# Patient Record
Sex: Female | Born: 1955 | Race: White | Hispanic: No | Marital: Married | Smoking: Never smoker
Health system: Southern US, Community
[De-identification: ages and names within clinical notes are randomized; demographics above are authoritative.]

## PROBLEM LIST (undated history)

## (undated) DIAGNOSIS — M1990 Unspecified osteoarthritis, unspecified site: Secondary | ICD-10-CM

## (undated) DIAGNOSIS — Z8719 Personal history of other diseases of the digestive system: Secondary | ICD-10-CM

## (undated) DIAGNOSIS — F329 Major depressive disorder, single episode, unspecified: Secondary | ICD-10-CM

## (undated) DIAGNOSIS — K5909 Other constipation: Secondary | ICD-10-CM

## (undated) DIAGNOSIS — R112 Nausea with vomiting, unspecified: Secondary | ICD-10-CM

## (undated) DIAGNOSIS — K219 Gastro-esophageal reflux disease without esophagitis: Secondary | ICD-10-CM

## (undated) DIAGNOSIS — Z9889 Other specified postprocedural states: Secondary | ICD-10-CM

## (undated) DIAGNOSIS — F32A Depression, unspecified: Secondary | ICD-10-CM

## (undated) DIAGNOSIS — R32 Unspecified urinary incontinence: Secondary | ICD-10-CM

## (undated) DIAGNOSIS — Z9289 Personal history of other medical treatment: Secondary | ICD-10-CM

## (undated) HISTORY — PX: ROTATOR CUFF REPAIR: SHX139

## (undated) HISTORY — DX: Unspecified urinary incontinence: R32

## (undated) HISTORY — PX: EYE SURGERY: SHX253

## (undated) HISTORY — PX: JOINT REPLACEMENT: SHX530

## (undated) HISTORY — PX: ABDOMINAL HYSTERECTOMY: SHX81

## (undated) HISTORY — PX: CATARACT EXTRACTION, BILATERAL: SHX1313

## (undated) HISTORY — DX: Other constipation: K59.09

## (undated) HISTORY — PX: HIATAL HERNIA REPAIR: SHX195

## (undated) HISTORY — PX: HERNIA REPAIR: SHX51

## (undated) HISTORY — DX: Gastro-esophageal reflux disease without esophagitis: K21.9

## (undated) HISTORY — PX: CHOLECYSTECTOMY: SHX55

---

## 1980-04-01 HISTORY — PX: APPENDECTOMY: SHX54

## 2000-05-28 ENCOUNTER — Inpatient Hospital Stay (HOSPITAL_COMMUNITY): Admission: EM | Admit: 2000-05-28 | Discharge: 2000-05-30 | Payer: Self-pay | Admitting: *Deleted

## 2000-12-29 ENCOUNTER — Ambulatory Visit (HOSPITAL_COMMUNITY): Admission: RE | Admit: 2000-12-29 | Discharge: 2000-12-29 | Payer: Self-pay | Admitting: Pulmonary Disease

## 2001-05-05 ENCOUNTER — Ambulatory Visit (HOSPITAL_COMMUNITY): Admission: RE | Admit: 2001-05-05 | Discharge: 2001-05-05 | Payer: Self-pay | Admitting: Orthopaedic Surgery

## 2001-09-28 ENCOUNTER — Ambulatory Visit (HOSPITAL_COMMUNITY): Admission: RE | Admit: 2001-09-28 | Discharge: 2001-09-28 | Payer: Self-pay | Admitting: Otolaryngology

## 2001-09-28 ENCOUNTER — Encounter: Payer: Self-pay | Admitting: Otolaryngology

## 2002-03-08 ENCOUNTER — Ambulatory Visit (HOSPITAL_COMMUNITY): Admission: RE | Admit: 2002-03-08 | Discharge: 2002-03-08 | Payer: Self-pay | Admitting: Pulmonary Disease

## 2002-12-01 ENCOUNTER — Ambulatory Visit (HOSPITAL_COMMUNITY): Admission: RE | Admit: 2002-12-01 | Discharge: 2002-12-01 | Payer: Self-pay | Admitting: Internal Medicine

## 2003-06-20 ENCOUNTER — Ambulatory Visit (HOSPITAL_COMMUNITY): Admission: RE | Admit: 2003-06-20 | Discharge: 2003-06-20 | Payer: Self-pay | Admitting: Internal Medicine

## 2003-12-19 ENCOUNTER — Inpatient Hospital Stay (HOSPITAL_COMMUNITY): Admission: RE | Admit: 2003-12-19 | Discharge: 2003-12-24 | Payer: Self-pay | Admitting: Obstetrics and Gynecology

## 2004-01-01 ENCOUNTER — Inpatient Hospital Stay (HOSPITAL_COMMUNITY): Admission: AD | Admit: 2004-01-01 | Discharge: 2004-01-04 | Payer: Self-pay | Admitting: Obstetrics & Gynecology

## 2005-04-18 ENCOUNTER — Ambulatory Visit (HOSPITAL_COMMUNITY): Admission: RE | Admit: 2005-04-18 | Discharge: 2005-04-18 | Payer: Self-pay | Admitting: Obstetrics and Gynecology

## 2005-08-21 ENCOUNTER — Ambulatory Visit (HOSPITAL_COMMUNITY): Admission: RE | Admit: 2005-08-21 | Discharge: 2005-08-21 | Payer: Self-pay | Admitting: Pulmonary Disease

## 2006-03-27 ENCOUNTER — Ambulatory Visit (HOSPITAL_COMMUNITY): Admission: RE | Admit: 2006-03-27 | Discharge: 2006-03-27 | Payer: Self-pay | Admitting: Obstetrics and Gynecology

## 2006-04-09 ENCOUNTER — Encounter: Admission: RE | Admit: 2006-04-09 | Discharge: 2006-04-09 | Payer: Self-pay | Admitting: Obstetrics and Gynecology

## 2006-04-29 ENCOUNTER — Ambulatory Visit (HOSPITAL_COMMUNITY): Admission: RE | Admit: 2006-04-29 | Discharge: 2006-04-29 | Payer: Self-pay | Admitting: Obstetrics & Gynecology

## 2006-11-04 ENCOUNTER — Ambulatory Visit (HOSPITAL_COMMUNITY): Admission: RE | Admit: 2006-11-04 | Discharge: 2006-11-04 | Payer: Self-pay | Admitting: Pulmonary Disease

## 2007-04-08 ENCOUNTER — Encounter: Admission: RE | Admit: 2007-04-08 | Discharge: 2007-04-08 | Payer: Self-pay | Admitting: Obstetrics and Gynecology

## 2007-06-15 ENCOUNTER — Encounter: Admission: RE | Admit: 2007-06-15 | Discharge: 2007-06-15 | Payer: Self-pay | Admitting: Otolaryngology

## 2007-08-04 ENCOUNTER — Ambulatory Visit (HOSPITAL_COMMUNITY): Admission: RE | Admit: 2007-08-04 | Discharge: 2007-08-04 | Payer: Self-pay | Admitting: Pulmonary Disease

## 2007-09-03 ENCOUNTER — Emergency Department (HOSPITAL_COMMUNITY): Admission: EM | Admit: 2007-09-03 | Discharge: 2007-09-03 | Payer: Self-pay | Admitting: Emergency Medicine

## 2008-12-21 ENCOUNTER — Encounter: Admission: RE | Admit: 2008-12-21 | Discharge: 2008-12-21 | Payer: Self-pay | Admitting: Neurosurgery

## 2009-01-19 ENCOUNTER — Encounter: Admission: RE | Admit: 2009-01-19 | Discharge: 2009-01-19 | Payer: Self-pay | Admitting: Neurosurgery

## 2009-01-19 ENCOUNTER — Encounter: Admission: RE | Admit: 2009-01-19 | Discharge: 2009-01-19 | Payer: Self-pay | Admitting: Pulmonary Disease

## 2009-02-15 ENCOUNTER — Ambulatory Visit (HOSPITAL_COMMUNITY): Admission: RE | Admit: 2009-02-15 | Discharge: 2009-02-15 | Payer: Self-pay | Admitting: Orthopaedic Surgery

## 2009-11-23 ENCOUNTER — Ambulatory Visit (HOSPITAL_BASED_OUTPATIENT_CLINIC_OR_DEPARTMENT_OTHER): Admission: RE | Admit: 2009-11-23 | Discharge: 2009-11-23 | Payer: Self-pay | Admitting: Orthopaedic Surgery

## 2009-11-29 ENCOUNTER — Ambulatory Visit (HOSPITAL_COMMUNITY): Admission: RE | Admit: 2009-11-29 | Discharge: 2009-11-29 | Payer: Self-pay | Admitting: Orthopaedic Surgery

## 2009-12-05 ENCOUNTER — Encounter (HOSPITAL_COMMUNITY): Admission: RE | Admit: 2009-12-05 | Discharge: 2010-01-04 | Payer: Self-pay | Admitting: Family Medicine

## 2010-01-22 ENCOUNTER — Encounter: Admission: RE | Admit: 2010-01-22 | Discharge: 2010-01-22 | Payer: Self-pay | Admitting: Obstetrics and Gynecology

## 2010-04-22 ENCOUNTER — Encounter: Payer: Self-pay | Admitting: Pulmonary Disease

## 2010-06-15 LAB — POCT HEMOGLOBIN-HEMACUE: Hemoglobin: 14 g/dL (ref 12.0–15.0)

## 2010-08-17 NOTE — H&P (Signed)
[facility]  Patient:    Wendy Reeves, Wendy Reeves                       MRN: [id number] Adm. Date:  [id number] Disc. Date: [id number] Attending:  Denny Peon Dictator:   Landry Corporal, N.P.                         History and Physical  PLEASE ADD INTO ABDOMEN ON PHYSICAL EXAMINATION:  Patient has well healed scars from prior surgery. DD:  05/29/00 TD:  05/30/00 Job: [id number]

## 2010-08-17 NOTE — H&P (Signed)
[facility]  Patient:    Wendy Reeves, Wendy Reeves                       MRN: [id number] Adm. Date:  [id number] Disc. Date: [id number] Attending:  Denny Peon Dictator:   Landry Corporal, N.P.                         History and Physical  PATIENT IDENTIFICATION:  This is a 55 year old  married white female voluntarily admitted to [facility] on May 29, 2000 for depression and suicidal ideation.  HISTORY OF PRESENT ILLNESS:  Patient presented with a history of depression that had exacerbated when patient began to switch her schedules at work. Patient does have a long history of depression.  She has recently been changing her medications and has been tried on a new antidepressant about a week ago.  Presently on Celexa 60 mg for approximately 1 week.  Patient has been experiencing headaches, she has been crying, feeling like she "cant function," feeling no energy.  She has been sleeping poorly after her night shift.  Her appetite has been good.  She denies any current suicidal ideation, homicidal ideation, no auditory or visual hallucinations, no paranoia.  PAST PSYCHIATRIC HISTORY:  She has had no inpatient admissions.  She sees Dr. Wynonia Lawman in [place] and Dr. Corliss Marcus.  She has been seeing them since last May.  She has been on Paxil for approximately 10 years, and then was put on Wellbutrin, felt that it was not as effective, and then on Effexor where she was experiencing headaches, and is now on Celexa, initially started at 18, and is presently on 60 mg for 1 week.  SUBSTANCE ABUSE HISTORY:  She is a nonsmoker, rare alcohol drink, and denies any substance abuse.  PAST MEDICAL HISTORY:  Her primary care physician is Dr. Juanetta Gosling in [place].  Medical problems are none.  Patient did have a history of meningitis 10 years ago that she feels prompted the beginning of her depression, with a fever of about 103.  Medications:  She is on Vioxx 25 mg  1 p.o. q.d., Lamictal 25 mg p.o. q.a.m. and 1 at 1400 and 2 at bedtime.  She takes Ambien 5-10 mg h.s. for sleep which is effective, and Senestine 1.25 mg at bedtime, and she takes Lipitor 10 mg p.o. q.h.s.  DRUG ALLERGIES:  Patient is allergic to PENICILLIN, SULFA and BETADINE.  She gets rashes and breaks out in hives.  PHYSICAL EXAMINATION:  Performed today on the unit with no significant findings.  See below.  Labs are pending.  We are awaiting a thyroid level.  REVIEW OF SYSTEMS:  Patient has had no fever or chills.  She has had a history of night sweats, placed on estrogen replacement.  Patient reports no blurred or double vision.  She wears glasses for reading.  She had an eye exam in January.  Patient has had an occasional ear ache, with an recent ear infection in October.  No hearing loss or sinus pain.  No chest pain, palpations or racing of her heart.  She is a nonsmoker.  No history of hypertension. Patient has no cough or shortness of breath, or orthopnea, no history of COPD. GI:  No change in habits.  Patient does report some occasional constipation, no blood in the stool.  GU:  No frequency, dysuria, or hematuria. MUSCULOSKELETAL:  Patient does have occasional neck pain which  she takes Vioxx for.  SKIN:  Occasional dryness, no itching or rash or bruising.  NEUROLOGIC: No seizures or memory loss.  Patient has been experiencing recent headaches. Patient has a history of depression.  ENDOCRINE:  No signs or symptoms of diabetes or thyroid problems.  LYMPH:  No enlarged or tender nodes. ALLERGIES:  No environmental allergies reported.  PHYSICAL EXAMINATION:  Vital signs 98.3, 68 heart rate, respiratory rate of 20, blood pressure 111/58.  GENERAL APPEARANCE:  Patient is a 55 year old white female sitting on the exam table in no acute distress.  She is well developed, appears her stated age. She is well-groomed, alert and cooperative.  HEAD is normocephalic.  She can raise  her eyebrows.  EYES:  Pupils are equal and reactive to light.  Her EOMs are intact bilaterally.  Funduscopic exam was within normal limits.  EARS:  External ear canals are patent.  Her TMs are intact.  Nostrils are patent bilaterally.  There is no sinus tenderness.  Her mouth mucosa is moist, she has good dentition.  There are no lesions seen. Her tongue protrudes midline without tremor.  She can clench her teeth and puff out her cheeks.  No pharyngeal exudate.  NECK is supple, full range of motion, no JVD, negative lymphadenopathy. Trachea is midline.  Thyroid is nonpalpable and nontender.  Her chest is clear to auscultation, no adventitious sounds.  HEART rate is regular rate and rhythm, no murmurs or gallops.  Carotid pulses are equal and adequate bilaterally.  There are no carotid bruits auscultated. There is no edema noted.  ABDOMEN:  She has a soft, nontender abdomen.  There are no masses or organomegaly palpated.  Active bowel sounds are present.  No CVA tenderness.  MUSCULAR:  There is no joint swelling or deformity.  Her muscle strength and tone is equal bilaterally.  SKIN is warm and dry.  She does have small, superficial abrasions to her left arm which she states is from being out in the yard.  Her nail beds are pink with good capillary refill.  She has 2+ radial pulses.  NEUROLOGICALLY she is oriented x 3.  Her cranial nerves are grossly intact. Her deep tendon reflexes are 2+, equal and adequate.  She has good grip strength bilaterally, no involuntary movements.  Her gait is normal. Cerebellar function is intact, with heel to shin, finger to finger.  Romberg is negative.  SOCIAL HISTORY:  She is a 55 year old married white female, for 23 years, has 3 children, twins ages 4 and a 47 year old .  She also has a Surveyor, quantity. She lives with her spouse and children.  She lives in [employer] [place] at [place] [employer] [place] for the past 14 years.  She has completed high  school. 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  FAMILY HISTORY:  She has a sister with a prior suicide attempt years ago.   MENTAL STATUS EXAMINATION:  She is an alert middle-aged white female.  She is cooperative and casually dressed.  Her speech is normal and relevant.  Her mood is euthymic.  Her affect is pleasant.  Her thought processes are coherent.  There is no evidence of psychosis, no auditory or visual hallucinations, no suicidal or homicidal ideation, no paranoia.  Cognitive function is intact.  Memory is good.  Judgment is good.  Insight is good.  ADMISSION DIAGNOSES: Axis I:    Major depression. Axis II:   Deferred. Axis III:  Joint pain, headaches, hypercholesterolemia. Axis  IV:   Mild, problems related to occupation. Axis V:    Current is 35, past year 65.  INITIAL PLAN OF CARE:  Voluntary admission to [facility] for depression and medication adjustment, contract for safety, check every 15 minutes.  Patient agrees to be safe.  We will resume her routine medications and add Wellbutrin, a small dose, for patient tolerance and to combat depressive symptoms.  Patient is to attend groups.  Dr. Lourdes Sledge in for interview and available for consultation.  ESTIMATED LENGTH OF STAY:  Two to three days.  POST HOSPITAL CARE PLAN:  To return to prior living arrangements, and with the possibility of making adjustments in her work schedule. DD:  05/29/00 TD:  05/30/00 Job: [id number]

## 2010-08-17 NOTE — Op Note (Signed)
[facility]  Patient:    Wendy Reeves, PILLARS Visit Number: [id number] MRN: [id number]          Service Type: DSU Location: DAY Attending Physician:  Windle Guard Dictated by:   Darreld Mclean, M.D. Proc. Date: 05/05/01 Admit Date:  05/05/2001                             Operative Report  PREOPERATIVE DIAGNOSIS:  Recurrent perionychia right ring finger.  POSTOPERATIVE DIAGNOSIS:  Recurrent perionychia, right ring finger.  OPERATION:  Excision, drainage, irrigation, debridement of right ring finger perionychium with placement of wick.  SURGEON:  Darreld Mclean, M.D.  ASSISTANT:  B. Manson Passey, P.A.C.  ANESTHESIA:  Bier block  INDICATIONS:  The patient had perionychia of the right ring finger last summer, treated.  She did well.  This recurred.  I made a small incision last week.  The perionychia is slow in resolving.  Surgery is recommended to fully open it and fully debride it and remove tissue that is infected.  She has been on Cipro for the last week.  The finger is getting somewhat better, but she still has some drainage.  The most pertinent thing to do is to perform a debridement.  FINDINGS:  The patient had some slight yellow pus.  There was some tissue more on the most ulnar aspect of the finger that was somewhat purulent.  This was removed and debrided.  DESCRIPTION OF PROCEDURE:  The patient was placed supine on the operating table.  Bier block anesthesia was given to right upper extremity   The patient was prepped and draped in the usual manner.  Incision was made on each side of the nail bed, and then the skin was brought back, and first cultures were done.  Then the area was debrided sharply with a knife and then a rongeur and then a curet to remove the tissue that was obviously infected and nonviable. Water Pik lavage was used, 3 liters saline used through [employer], debriding this.  Care was taken not to damage the nail matrix or the  origin of the nail.  Care was also taken not to damage the insertion of the extensor tendon.  The wick was placed.  A sterile bulky dressing was applied, then a bulky 2 x 2 gauze dressing was applied.  The patient tolerated the procedure well and went to recovery in good condition.  She was given Tylox for pain and Levaquin for an antibiotic.  I will see her tomorrow in [place] and will remove the dressing and take out the wick. Dictated by:   Darreld Mclean, M.D. Attending Physician:  Windle Guard DD:  05/05/01 TD:  05/06/01 Job: [id number]

## 2010-08-17 NOTE — Discharge Summary (Signed)
NAMECHANTAVIA, BAZZLE NO.:  [id number]   MEDICAL RECORD NO.:  [id number]          PATIENT TYPE:  INP   LOCATION:  A427                          FACILITY:  [facility]   PHYSICIAN:  Tilda Burrow, M.D. 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   DISCHARGE DIAGNOSIS:  Dyspaurnia, anterior abdominal wall bowel adhesions,  involving the sigmoid colon, both ovaries, periumbilical adhesions, small  bowel.   PROCEDURE:  Diagnostic laparoscopy, small bowel enterotomy, conversion to  laparotomy, repair of small bowel enterotomy by Dr. Lovell Sheehan, bilateral  salpingo-oophorectomy and lysis of adhesion.   SURGEON:  Tilda Burrow, M.D.   DISCHARGE MEDICATIONS:  1.  Vicodin one to two tablets q.4h. p.r.n. pain.  2.  Phenergan one tablet q.6h. p.r.n. nausea.  3.  Milk of Magnesia two tablespoons daily until bowel movement.   HOSPITAL SUMMARY:  This 55 year old postmenopausal female not on hormone  therapy, is admitted for laparoscopic removal of tubes and ovaries.  She is  status post 10 years status post abdominal hysterectomy with postoperative  complications at readmission for 24 hours, two weeks later due to pelvic  discomfort.  She has had gradual progression and constipation over the  years.  She is suspected to have residual pelvic adhesions status post her  prior surgery.   PAST MEDICAL HISTORY:  Notable for chronic constipation in 2004 and 2005  with a negative workup.  She has a history of gastroesophageal reflux  disease, depression and intermittent headaches.  See admitting history for  further details.   HOSPITAL COURSE:  The patient was admitted, underwent diagnostic laparoscopy  which was performed on the day of admission after day surgery admission with  bowel having been prepped the day  before.  An infraumbilical vertical 1 cm  skin incision was made, and a Veress needle was used successfully.  It was  apparent that there was bowel loop adherent to where we entered in the  subumbilical site, and we had perforation of a loop of small bowel.  This  was recognized immediately.  We proceeded with laparotomy with consultation  by Dr. Lovell Sheehan.  We entered the abdominal cavity, mobilized the bowel and  performed a small bowel repair.  Subsequent findings included lower pelvis  with some thin, small adhesions of omentum with the pelvis itself being  particularly adherent in the left adnexa with small bowel loops removed,  mobilized, separated, and bilateral salpingo-oophorectomy performed.  The  rectosigmoid required resection from the left pelvic side wall due to  extensive longstanding adhesions.  Right salpingo-oophorectomy was less  challenging.  The salpingo-oophorectomy had required extensive sigmoid  adhesions removal.  The vaginal cuff was attached to the distal sigmoid in  such a way that it was felt it was likely this contributed to the chronic  constipation.  It is noted that the sigmoid was quite thin in the pelvis and  moderately dilated.   Postoperatively, the patient  did well, and had Jackson-Pratt drains in  place.   The patient had a nasogastric tube for approximately 48 hours.  She had PCA  pain control.  The bowels were hypoactive and virtually absent for the first  two days after surgery.   On postoperative day #3, the abdomen was soft, nontender.  The nasogastric  tube was discontinued.  She was begun on full liquids.  She remained  afebrile with hypoactive bowel sounds.  She complained of minimal incisional  pain.  A subcutaneous Jackson-Pratt drain that had been left in at the time  of surgery was removed on September 23, postoperative day #4.  She tolerated  that, and was able to go home on December 24, 2003.   DISCHARGE MEDICATIONS:  1.  Reglan.   2.  Vicodin.  3.  Milk of Magnesia.  4.  Along with her previous medications taken at home which consisted of      Celexa 40 mg b.i.d., Lamictal 200 mg q.a.m. and 400 mg q.h.s., Neurontin      600 mg b.i.d., trazodone 100 mg q.h.s.   FOLLOWUP:  With me in one week for staple removal.     John   JVF/MEDQ  D:  03/07/2004  T:  03/08/2004  Job:  [id number]

## 2010-08-17 NOTE — H&P (Signed)
NAMEARIELL, Reeves NO.:  [id number]   MEDICAL RECORD NO.:  [id number]          PATIENT TYPE:  INP   LOCATION:  A425                          FACILITY:  [facility]   PHYSICIAN:  Lazaro Arms, M.D.   DATE OF BIRTH:  [birth year]   DATE OF ADMISSION:  01/01/2004  DATE OF DISCHARGE:  LH                                HISTORY & PHYSICAL   HISTORY OF PRESENT ILLNESS:  Wendy Reeves is a 55 year old white female, who is  now 2 weeks postop from a laparoscopic diagnostic laparoscopy with a  subsequent laparotomy with BSO and a repair of a small bowel enterotomy.  Postoperatively, the patient has had slow return of normal bowel function.  She had a slightly prolonged intrahospital course, was discharged with some  degree of diminished bowel function, but the patient was progressing  normally with flatus, bowel movement, and tolerating her diet.  She has been  into the office on several occasions postoperatively complaining of  bloating, nausea, vomiting, and abdominal pain.  She was last seen in the  office on December 30, 2003, at which time it was felt she continued to  have symptoms of a postoperative ileus.  Dr. Emelda Fear and I evaluated her at  that time and told her that if she had any problems over the weekend to  contact us.  She, about 10:30 last night, began having nausea and vomiting  and increase in her abdominal pain.  She called this morning, and I told her  to come in for bowel rest, IV hydration, and clinical evaluation.   At the time of admission, the patient has I would say mild to moderate  distention, stable for what it was 2 days prior to admission.  Her incision  was clean, dry, and intact.  Staples removed; Steri-Strips were removed.  The bowel was soft, not overly tender.  There are positive bowel sounds in  all 4 quadrants.  No significant rushes or tinkles.  As a result, she is  admitted for IV hydration, x-ray evaluation of her bowel dilation, and  clinical observation.   PAST MEDICAL HISTORY:  1.  The patient has a history of chronic constipation during 2004-2005, work-      up by Dr. __________was negative.  2.  History of reflux.  3.  Depression.  4.  Intermittent headaches.   PAST SURGICAL HISTORY:  1.  She had two C-sections and tubal ligation in 1986.  2.  Appendectomy in 1982.  3.  Two D&C's in the past.  4.  She has had obviously the recent laparoscopy and laparotomy for BSO and      the enterotomy that was repaired by Dr. Lovell Sheehan.   MEDICATIONS:  1.  Celexa 40 mg b.i.d.  2.  Lamictal 200 mg in the morning, 400 mg at night.  3.  Neurontin 600 mg b.i.d.  4.  Trazodone 100 mg at night.   PHYSICAL EXAMINATION:  HEENT:  Unremarkable.  LUNGS:  Clear.  HEART:  Regular rate and rhythm, no murmurs, rubs, or gallops.  BREASTS:  Deferred.  ABDOMEN:  Soft, mild to moderate distention, stable from 48 hours previous.  Auscultation reveals positive bowel sounds in all 4 quadrants.  No rushes or  tinkles are heard.  It is again, basically stable with 48 hours ago.  PELVIC:  Deferred.  EXTREMITIES:  Warm with no edema.  NEUROLOGIC:  Grossly intact.   IMPRESSION:  1.  Postoperative ileus.  2.  Evaluate for early small bowel obstruction.   PLAN:  The patient is admitted to undergo IV hydration, bowel rest, nausea  control, pain control, evaluation with plain films, and I will contact Dr.  Lovell Sheehan and have him evaluate the patient as well.  Laboratory data are also  drawn.      LHE/MEDQ  D:  01/01/2004  T:  01/01/2004  Job:  [id number]

## 2010-08-17 NOTE — Op Note (Signed)
NAME:  Wendy Reeves, Wendy Reeves                          ACCOUNT NO.:  [id number]   MEDICAL RECORD NO.:  [id number]                   PATIENT TYPE:  AMB   LOCATION:  DAY                                  FACILITY:  [facility]   PHYSICIAN:  Dalia Heading, M.D.               DATE OF BIRTH:  1956/01/09   DATE OF PROCEDURE:  12/19/2003  DATE OF DISCHARGE:                                 OPERATIVE REPORT   PREOPERATIVE DIAGNOSIS:  Iatrogenic small bowel enterotomy.   POSTOPERATIVE DIAGNOSIS:  Iatrogenic small bowel enterotomy.   PROCEDURE:  Repair of small bowel enterotomy.   SURGEON:  Dalia Heading, M.D.   ASSISTANT:  Tilda Burrow, M.D.   ANESTHESIA:  General endotracheal.   INDICATIONS FOR PROCEDURE:  The patient is a 55 year old white female who  was already under general endotracheal anesthesia when Dr. Emelda Fear, during  an attempt to enter the abdominal cavity for a laparoscopic procedure, noted  a small bowel enterotomy due to the trocar insertion which was done under  camera visualization.  He requested that I come in to inspect and repair the  small intestine.   DESCRIPTION OF PROCEDURE:  A midline incision was made from the umbilicus to  the suprapubic region.  In the upper portion of the incision, several loops  of small bowel were noted to be adhesed to the underside of the abdominal  wall.  These were freed away sharply without difficulty.  A small bowel  enterotomy was noted on the antimesenteric border of the mid-small bowel.  No through-and-through injury was noted.  The mesentery was inspected and  noted to be within normal limits.  The edges were noted to be clean, without  necrosis or bleeding.  The enterotomy measured only about 1 cm in size.  This was closed transversely using 3-0 silk sutures in a two-layer fashion.  The repair was noted to be intact.  The rest of the adhesions were also  taken down into the pelvis.  The small bowel was then run from the ligament  of  Treitz to the terminal ileum.  No other injuries were noted.  The  enterotomy repair was also inspected again and noted to be within normal  limits.  Minimal spillage was noted.  The abdomen and pelvis was then  copiously irrigated with gentamycin and normal saline.  Dr. Emelda Fear was  then going to proceed with his intended surgery.   ESTIMATED BLOOD LOSS:  Minimal.   SPECIMENS:  None.      MAJ/MEDQ  D:  12/19/2003  T:  12/19/2003  Job:  [id number]   cc:   Tilda Burrow, M.D.  [address] [place]  [place] [address]  Fax: [phone]

## 2010-08-17 NOTE — Op Note (Signed)
NAME:  Wendy Reeves, Wendy Reeves                          ACCOUNT NO.:  [id number]   MEDICAL RECORD NO.:  [id number]                   PATIENT TYPE:  AMB   LOCATION:  DAY                                  FACILITY:  [facility]   PHYSICIAN:  Lionel December, M.D.                 DATE OF BIRTH:  Jan 02, 1956   DATE OF PROCEDURE:  12/01/2002  DATE OF DISCHARGE:                                 OPERATIVE REPORT   PROCEDURE:  Total colonoscopy.   INDICATIONS FOR PROCEDURE:  Jazzlynn is a 55 year old Caucasian female with an  eight-month history of constipation.  She is undergoing colonoscopy  primarily for diagnostic purposes.  Recent TSH was normal, and serum calcium  has been drawn and is pending.  She was begun on MiraLax and Fiber Choice  and is doing a lot better.  The procedure was reviewed with the patient, and  informed consent was obtained.   PREOPERATIVE MEDICATIONS:  Demerol 40 mg IV, Versed 5 mg IV in divided  doses.   FINDINGS:  The procedure was performed in the endoscopy suite.  The  patient's vital signs and O2 saturations were monitored during the procedure  and remained stable.  The patient was placed in the left lateral recumbent  position and rectal examination performed.  No abnormality noted on external  or digital exam.  The Olympus videoscope was placed into the rectum and  advanced into the region of the sigmoid colon and beyond.  She had fine  mucosal pigmentation, more pronounced at the sigmoid colon, consistent with  melanosis coli.  The scope was passed to the cecum which was identified by  the ileocecal valve and appendiceal stump.  Pictures were taken for the  record.  The preparation overall was felt to be satisfactory.  As the scope  was withdrawn, the colonic mucosa was once again carefully examined and was  normal throughout, other than the mucosal pigmentation.  The rectal mucosa  was normal.  The scope was retroflexed to examine the anorectal junction  which was  unremarkable.  The endoscope was straightened and withdrawn.  The  patient tolerated the procedure well.   FINAL DIAGNOSIS:  Mild changes of melanosis coli.  Otherwise, normal  colonoscopy.    RECOMMENDATIONS:  1. She will continue Fiber Choice and MiraLax as before.  2. She will return for office visit in three months.  3. I will contact the patient with the results of the serum calcium.                                               Lionel December, M.D.    NR/MEDQ  D:  12/01/2002  T:  12/01/2002  Job:  [id number]   cc:   Tilda Burrow, M.D.  [address]  [address] [place]  [place]  [place] [address]  Fax: [phone]

## 2010-08-17 NOTE — H&P (Signed)
NAME:  Wendy Reeves, Wendy Reeves                          ACCOUNT NO.:  [id number]   MEDICAL RECORD NO.:  [id number]                   PATIENT TYPE:  AMB   LOCATION:  DAY                                  FACILITY:  [facility]   PHYSICIAN:  Tilda Burrow, M.D.              DATE OF BIRTH:  [birth year]   DATE OF ADMISSION:  DATE OF DISCHARGE:                                HISTORY & PHYSICAL   ADDENDUM:  No dictation given.                                            Tilda Burrow, M.D.    JVF/MEDQ  D:  12/18/2003  T:  12/18/2003  Job:  [id number]   cc:   [facility]

## 2010-08-17 NOTE — H&P (Signed)
NAMEREISHA, WOS NO.:  [id number]   MEDICAL RECORD NO.:  [id number]          PATIENT TYPE:  AMB   LOCATION:  DAY                           FACILITY:  [facility]   PHYSICIAN:  Tilda Burrow, M.D. DATE OF BIRTH:  January 16, 1956   DATE OF ADMISSION:  DATE OF DISCHARGE:  LH                                HISTORY & PHYSICAL   ADMISSION DIAGNOSIS:  Dyspareunia, scheduled for laparoscopic removal of  ovaries.   HISTORY OF PRESENT ILLNESS:  This 55 year old female, postmenopausal, not  currently on hormone therapy, is admitted at this time for laparoscopic  removal of tubes and ovaries.  Robena is 10 years status post abdominal  hysterectomy with postoperative course complicated by readmission for 24  hours two weeks after surgery due to pelvic discomfort.  She was never  febrile, and had a relatively straightforward course.  It was felt related  to probably related ovarian function.  She subsequently has done relatively  well, but has tolerated progressively increasing dyspareunia with the  passage of time.  At this point the pain is considered incapacitating.  She  has good pelvic support but has tenderness at the vaginal cuff with a sense  of fullness just above the vaginal cuff on the left side.  She is suspected  to have residual adhesions, status post her prior surgery and likely ovarian  involvement.  Plans are for laparoscopic assessment of the pelvis with plans  to proceed with laparoscopic removal of both tubes and ovaries.  One  potential for a need for laparotomy, dependent on findings, is being quoted  to the patient at 5-10% chance.  Questions regarding surgery have been  answered to the patient's satisfaction.   PAST MEDICAL HISTORY:  Positive for chronic constipation during 2004 and  2005 with workup by Dr. Karilyn Cota, negative and patient eventually  discontinuing all of her anticonstipation medications.  She also has a  history of reflux, depression, and  intermittent headaches.   PAST SURGICAL HISTORY:  1.  Positive for C-sections x2.  2.  Tubal ligation, 1986.  3.  Appendectomy, 1982.  4.  D&C x2 in the past.  5.  Subsequently she has had laparoscopic cholecystectomy, with colonoscopy      performed in 2004, reportedly normal.   MEDICATIONS:  1.  Celexa 40 mg b.i.d.  2.  Lamictal 200 mg q.a.m., 400 mg q.h.s.  3.  Neurontin 600 mg b.i.d.  4.  Trazodone 100 mg p.o. q.h.s. for sleep, taken p.r.n.  5.  The patient has discontinued her __________ greater than six months ago.   PHYSICAL EXAMINATION:  GENERAL:  She is a healthy, cheerful, appropriate,  oriented Caucasian female.  HEENT:  Pupils equal, round, and reactive.  NECK:  Supple.  CHEST:  Clear to auscultation.  ABDOMEN:  Well-healed lower abdominal scar.  GENITALIA:  External genitalia normal.  Good vaginal support.  Vaginal cuff:  There are no masses above the vagina but a slight sense of fullness at the  left adnexa felt to represent adhesions, most likely involving the ovary  given her pain complaints.  EXTREMITIES:  Grossly normal without cyanosis, clubbing, or edema.  LYMPH NODES:  There is no adenopathy.   ASSESSMENT:  Dyspareunia due to vaginal cuff contact, suspected adhesions by  the vaginal cuff.   PLAN:  Laparoscopic bilateral salpingo-oophorectomy with probable lysis of  adhesions on December 19, 2003.  Plans are that if laparotomy is required,  the patient requests additional revision of her old C-section scar, with  excision of redundant skin as clinical circumstance allows.     John   JVF/MEDQ  D:  12/18/2003  T:  12/18/2003  Job:  [id number]   cc:   Tilda Burrow, M.D.  [address] [place]  [place] [address]  Fax: [phone]   Oneal Deputy. Juanetta Gosling, M.D.  [address]  [place]  [place] [address]  Fax: [phone]

## 2010-08-17 NOTE — Op Note (Signed)
NAMELEVETA, WAHAB NO.:  [id number]   MEDICAL RECORD NO.:  [id number]          PATIENT TYPE:  INP   LOCATION:  A427                          FACILITY:  [facility]   PHYSICIAN:  Tilda Burrow, M.D. DATE OF BIRTH:  [birth year]   DATE OF PROCEDURE:  12/19/2003  DATE OF DISCHARGE:                                 OPERATIVE REPORT   PREOPERATIVE DIAGNOSIS:  Dyspareunia, scheduled for laparoscopic removal of  ovaries.   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   PROCEDURE:  Diagnostic laparoscopy, small bowel enterotomy, conversion to  laparotomy with repair of small bowel enterotomy by Dr. Lovell Sheehan, and  subsequent bilateral salpingo-oophorectomy and lysis of adhesions by Dr. Jannifer Franklin   SURGEON:  Jorene Minors.   ANESTHESIA:  General.   COMPLICATIONS:  Necessity for midline incision to fully access abdominal  cavity to perform bowel repair.   FINDINGS:  Two loops of bowel just below the umbilicus, one of which was  injured during laparoscopic entry, recognized immediately. Extensive pelvic  adhesions with large dilated and tortuous sigmoid colon.   DETAILS OF PROCEDURE:  The patient was taken to the operating room, prepped  and draped in the usual fashion for combined abdominal and vaginal procedure  with legs supported in the yellow-fin low lithotomy leg supports and Foley  catheter in place. The abdomen was prepped and draped, and an infraumbilical  vertical 1-cm skin incision performed through the skin. The patient was  noted to have several upper abdomen puncture sites from prior laparoscopies,  and we were careful to avoid going through an old scar. Veress needle was  used to introduce through the umbilicus, being careful to elevate the  abdominal activity and point the Veress needle towards the pelvis. Water  droplet technique was used to  confirm intraperitoneal location, and  pneumoperitoneum achieved under 8 mmHg 2 to 3 liters CO2 instilled.  Laparoscopic 10-mm camera directed. Trocar was then used to enter the  abdominal cavity. The laparoscope was oriented toward the pelvis and passed  in the peritoneal fat for a distance of approximately 5 cm and was not able  to enter the abdominal cavity on the very first attempt but was reoriented  toward the pelvis and used with laparoscopic guidance and entered what  appeared to be abdomen until the trocar sleeve was emptied and the camera  reinserted. It was apparent that we had perforated into the loop of small  bowel. This was promptly recognized on the very start of the case. We  therefore left the trocar sleeve in place with the camera and prepped for  conversion for laparotomy. Decision was made to proceed with a midline  vertical incision. We made a vertical incision, removing a small ellipse of  redundant skin. We entered through the abdomen. By this time, Dr. Lovell Sheehan  was there having been requested for consultation and assistance. We then  entered the abdominal cavity carefully, removed the laparoscopic trocar,  identified the small bowel injury, and were able to  mobilize the small bowel  from its attachments which were quite dense, to the subumbilical area. There  were only 2 loops of bowel parallel to one another in the area below the  umbilicus. There was relatively easy mobilization of the bowel. The lower  pelvis had only some small thin adhesions of omentum. The pelvis itself was  quite adherent, particularly the left adnexa where the sigmoid was densely  adherent against the left pelvic side wall. We proceeded with mobilization  of the small bowel loops, separating them all and then inspecting carefully  to ensure that no through and through injury had occurred. None was  identified. This was consistent with the initial laparoscopic findings. We  were able to close  the bowel in 2 layer closure using 2-0 silk sutures  interrupted fashion placed in 2 layer closure. The pelvis was copiously  irrigated with antibiotic solution including gentamicin containing solution.  The small bowel was mobilized with some thin filmy adhesions encountered at  various spots. The entire bowel could be run. It was noted that there was a  significant amount of omental adhesions into the left lower quadrant. After  Dr. Lovell Sheehan had completed his peritoneal closure, we irrigated the abdomen  carefully, inspected the pelvis, and confirmed that there was relatively  clean pelvis other than the obscured left adnexa. Dr. Lovell Sheehan proceeded with  his own previously planned surgery thereafter.   The procedure was assisted by Kerry Hough, CST-FA. We proceeded to march  down along identifiable cleavage planes, carefully immobilizing the  rectosigmoid from the left pelvic side wall. There were extensive old long  standing adhesions holding the bowel from the side wall. These were easily  taken down once we confined satisfactory cleavage planes. We marched down  the side wall, being careful not to go retroperitoneal and to not injure or  denude the bowel. We eventually identified both adnexal structures, having  pulled away filmy adhesions from both. Once this was done, we were able to  perform bilateral salpingo-oophorectomy.   Right salpingo-oophorectomy was performed by grasping a readily identifiable  quiescent right ovary with Babcock clamp, mobilizing any small adhesions  from the right adnexa and gradually  freeing up the adnexal structures.  These could be dissected off the side wall with careful identification of  the right ureter, and we were able to take off the right tube and ovary with  identifying and isolating the infundibulopelvic ligament well away from the  ureter, cross clamping, and then peeling the ovary from its imbedded  position in the right pelvic side  wall.  Then we proceeded with the left salpingo-oophorectomy which first required  extensive dissection from the sigmoid to mobilize it off of the left pelvic  side wall. Distal sigmoid was also attached to the top of the vaginal cuff,  and it was felt that the bowel adhesions were a significant complicating  factor of the patient's long standing chronic constipation. We were able to  gradually mobilize the rectosigmoid and identify thin quiescent ovary stuck  to the side of the pelvis and the sigmoid. The colon was taken down from its  adhesions over the left side of the pelvis including vaginal apex. We were  able to identify the ureter down the left side wall and identify the left  infundibulopelvic ligament separate and distinct from the left ureter. We  had to trace the left ureter down all the way along the side wall to be sure  that there was  adequate access. We were able to take off the left tube and  ovary without compromising the ureter, though we were within 5 mm at times  in our dissection.   Right adnexa was similarly taken care of. Then we proceeded to irrigate the  pelvis copiously, inspect for any adhesions, free up the bowel completely so  that relaxed repositioning of the bowel could occur. We then proceeded with  irrigation of the abdomen, closure of the anterior peritoneum with 2-0  chromic, placement of 0 Vicryl closure of the fascia, placement of subcu  Jackson-Pratt drain, and 2-0 plain closure of the subcu fatty tissue and  staple closure of the skin with estimated blood loss 150 cc. The patient  tolerated the procedure well and went to recovery room in good condition.     John   JVF/MEDQ  D:  12/19/2003  T:  12/20/2003  Job:  [id number]

## 2010-08-17 NOTE — H&P (Signed)
[facility]  Patient:    Wendy Reeves, Wendy Reeves Visit Number: [id number] MRN: [id number]          Service Type: Attending:  Darreld Mclean, M.D. Dictated by:   Darreld Mclean, M.D.                           History and Physical  CHIEF COMPLAINT:  Infected ring finger.  HISTORY OF PRESENT ILLNESS:  The patient was seen by me a week ago today, on April 27, 2001, with complaints of pain and tenderness in her right ring finger.  She had a paronychia.  Dr. Malvin Johns asked that I see her at that time.  He previously had taken care of her finger back in August, with a similar problem.  I opened it up under local anesthesia, the ulnar-most aspect of the finger, the most fluctuant area, but was unable to get any pus.  I placed a wick in it, and she came back the next day.  The patient was going to physical therapy.  The finger looks better.  It is no longer red but is still somewhat swollen and tender.  I think there is some underlying tissue still present with underlying indolent infection.  I have recommended formal debridement in the OR for the paronychia.  PAST MEDICAL HISTORY:  Otherwise negative.  ALLERGIES:  PENICILLIN, SULFA, and LAMITRAL.  MEDICATIONS: 1. One baby aspirin a day. 2. Two Celexa at night. 3. Neurontin three a day. 4. Lamitical.  TOBACCO/ALCOHOL:  The does not smoke, does not use alcoholic beverage.  FAMILY PHYSICIAN:  Dr. Juanetta Gosling.  SURGEON:  Dr. Malvin Johns.  PAST SURGICAL HISTORY: 1. C-section in 1981. 2. Appendectomy in 1981. 3. C-section in 1985. 4. Hysterectomy partial in 1994. 5. Cholecystectomy in 1995.  FAMILY HISTORY:  There are no diseases that run in the family.  SOCIAL HISTORY:  The patient is married.  Lives here in [place].  She works at [employer].  PHYSICAL EXAMINATION:  VITAL SIGNS:  BP 108/72, pulse 60, respirations 16, afebrile.  Height 5 feet, 7 inches.  Weight 181 pounds.  HEENT:   Negative.  LUNGS:  Clear to P&A.  HEART:  Regular rhythm.  Without murmur heard.  ABDOMEN:  Soft and nontender.  Without masses.  EXTREMITIES:  Right ring finger is swollen distally, with some swelling particularly around the nail, nail bed from recent incision, healing on the ulnar aspect of the finger.  There is decreased range of motion significantly at the DIP joint.  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  Lower extremities within normal limits.  NEUROLOGIC:  Intact.  She is hypersensitive.  The patient has a very low pain threshold.  CNS:  Intact.  SKIN:  Intact.  IMPRESSION:  Recurrent paronychia right ring finger.  PLAN:  Formal incision, debridement, and irrigation in the OR.  I discussed with the patient the planned procedure, risks, and imponderables, and she appears to understand and agrees to the procedure as outlined. Dictated by:   Darreld Mclean, M.D. Attending:  Darreld Mclean, M.D. DD:  05/04/01 TD:  05/04/01 Job: [id number]

## 2010-08-17 NOTE — Discharge Summary (Signed)
[facility]  Patient:    Wendy Reeves, Wendy Reeves                       MRN: [id number] Adm. Date:  [id number] Disc. Date: [id number] Attending:  Denny Peon Dictator:   Candi Leash. Orsini, N.P.                           Discharge Summary  IDENTIFYING INFORMATION:  This is a 55 year old married white female voluntarily admitted for depression and suicidal ideation.  The patient presented with a history of depression that had exacerbated when the patient began to switch her schedule at work.  The patient does have a long history of depression, and her medications have been changed recently and was tried on a new antidepressant about a week ago.  The patient has been experiencing headaches.  She has also been crying, feeling like she "cant function," feeling no energy.  She has been sleeping poorly after her night shift.  Her appetite has been good.  She denies any current suicidal ideation, homicidal ideation, no auditory or visual hallucinations, no paranoia.  This is her first inpatient admission.  She sees Dr. Wynonia Lawman in [place] and Dr. Corliss Marcus.  The patient, as stated, has been on multiple antidepressants in the past.  PRIMARY CARE Delise Simenson:  Dr. Juanetta Gosling in [place].  PAST MEDICAL HISTORY:  No medical problems.  The patient does have a history of meningitis 10 years ago.  CURRENT MEDICATIONS: 1. Vioxx 25 mg one p.o. q.d. 2. Lamictal 25 mg p.o. q.a.m. 3. Ambien 5 to 10 mg for sleep. 4. Cenestin 1.25 mg h.s. 5. Lipitor 10 mg q.h.s.  DRUG ALLERGIES:  Allergic to PENICILLIN, SULFA, and BETADINE.  PHYSICAL EXAMINATION:  No significant findings.  LABORATORY DATA:  TSH is within normal limits as well as her CBC and her routine chemistries.  Her ALT is 49, somewhat elevated.  MENTAL STATUS EXAMINATION:  She is an alert middle-aged white female.  She is cooperative and casually dressed.  Speech is normal and relevant.  Mood is euthymic.  Affect is  pleasant.  Thought processes are coherent, no evidence of psychosis, no auditory or visual hallucinations, no suicidal or homicidal ideation, no paranoia.  Cognitive function is intact.  Memory is good. Judgment is good.  Insight is good.  ADMISSION DIAGNOSES: Axis I:    Major depression. Axis II:   Deferred. Axis III:  1. Joint pain.            2. Headaches.            3. Hypercholesterolemia. Axis IV:   Mild problems related to occupation. Axis V:    Current is 35; past year 14.  HOSPITAL COURSE:  This is a voluntary admission for depression and medication adjustment.  The patient is able to contract for safety.  Will resume her routine medications and Wellbutrin, a small dose, to patients tolerance to combat depressive symptoms.  The patient was doing well, she was sleeping well, she was having less headaches, and was having a brighter affect.  She was able to promise safety and felt optimistic about the future.  The patient was discharged due to the fact that she was having no suicidal ideation, her mood and affect were improved, and tolerating her medications.  It was felt that she could be managed on an outpatient basis.  DISCHARGE FOLLOWUP:  The patient was to follow  up with Dr. Wynonia Lawman (his phone number was provided) and a psychotherapist, Dr. Corliss Marcus, with phone number provided.  The patient has appointments; she just needs to call to confirm those appointments.  The patient was also instructed to call with problems with her medications and deteriorations of symptoms.  The patient was to use Motrin or Advil rather than Tylenol for her headaches.  In regards to activity, her meds were to be reevaluated by Dr. Wynonia Lawman prior to her return to work.  SPECIAL DISCHARGE INSTRUCTIONS:  The patient was to have a liver panel repeated in two weeks.  The patient was put on a low-fat/cholesterol diet.  DISCHARGE MEDICATIONS: 1. Lamictal 25 mg two a.m., one at 8 p.m., one at 2  p.m. 2. Ambien 10 mg one-half h.s. p.r.n. 3. Celexa 40 mg one q.d. 4. Wellbutrin SR 100 mg b.i.d. 5. Vioxx one tab p.r.n. 6. Zocor 20 mg one q.d. or use Lipitor 10 mg q.d. 7. Cenestin 1.25 q.d.  DISCHARGE DIAGNOSES: Axis I:    Major depression. Axis II:   Deferred. Axis III:  1. Joint pain.            2. Headaches.            3. Hypercholesterolemia. Axis IV:   Mild. Axis V:    Current is 65; this past year 40. DD:  06/19/00 TD:  06/20/00 Job: [id number]

## 2010-08-17 NOTE — Discharge Summary (Signed)
NAMEBROOK, GERACI                ACCOUNT NO.:  [id number]   MEDICAL RECORD NO.:  [id number]          PATIENT TYPE:  INP   LOCATION:  A411                          FACILITY:  [facility]   PHYSICIAN:  Tilda Burrow, M.D. DATE OF BIRTH:  05/06/1955   DATE OF ADMISSION:  01/01/2004  DATE OF DISCHARGE:  10/05/2005LH                                 DISCHARGE SUMMARY   ADMITTING DIAGNOSES:  1.  Postoperative nausea and vomiting.  2.  Postoperative ileus, rule out early small-bowel obstruction.   DISCHARGE DIAGNOSIS:  Postoperative ileus, resolved.   HOSPITAL COURSE:  The patient was admitted on January 01, 2004, complaining  of bloating, nausea, vomiting, abdominal pain, which had been seen in  between hospital care and was borderline for continued outpatient  management.  She got worse over the weekend and was admitted on January 01, 2004, for fluid hydration, and support.  She was treated with IV  antibiotics, Zofran 8 mg every eight hours IV, Reglan 10 mg IV every 8  hours, Protonix 40 mg IV q.24h.  An acute abdominal series was negative.  She managed well with her pain.  She had a normal CBC and MET-7 showed mild  hypokalemia responding to fluid hydration with potassium supplements.  After  three days, she was tolerated a regular diet well enough to be discharged  home to follow up in one week on our office as previously noted.     John   JVF/MEDQ  D:  03/07/2004  T:  03/08/2004  Job:  [id number]

## 2011-02-11 ENCOUNTER — Other Ambulatory Visit: Payer: Self-pay | Admitting: Obstetrics and Gynecology

## 2011-02-11 DIAGNOSIS — Z1231 Encounter for screening mammogram for malignant neoplasm of breast: Secondary | ICD-10-CM

## 2011-02-20 ENCOUNTER — Ambulatory Visit
Admission: RE | Admit: 2011-02-20 | Discharge: 2011-02-20 | Disposition: A | Discharge status: Home / Self Care | Payer: Managed Care, Other (non HMO) | Source: Ambulatory Visit | Attending: Obstetrics and Gynecology | Admitting: Obstetrics and Gynecology

## 2011-02-20 DIAGNOSIS — Z1231 Encounter for screening mammogram for malignant neoplasm of breast: Secondary | ICD-10-CM

## 2011-04-30 IMAGING — MG MM SCREEN MAMMOGRAM BILATERAL
4 series · 4 of 4 positions shown · non-contrast
Comparison: Prior studies.

DG SCREEN MAMMOGRAM BILATERAL
Bilateral CC and MLO view(s) were taken.
Prior study comparison: April 08, 2007, DG screen mammogram bilateral.

DIGITAL SCREENING MAMMOGRAM WITH CAD:

[R CC]
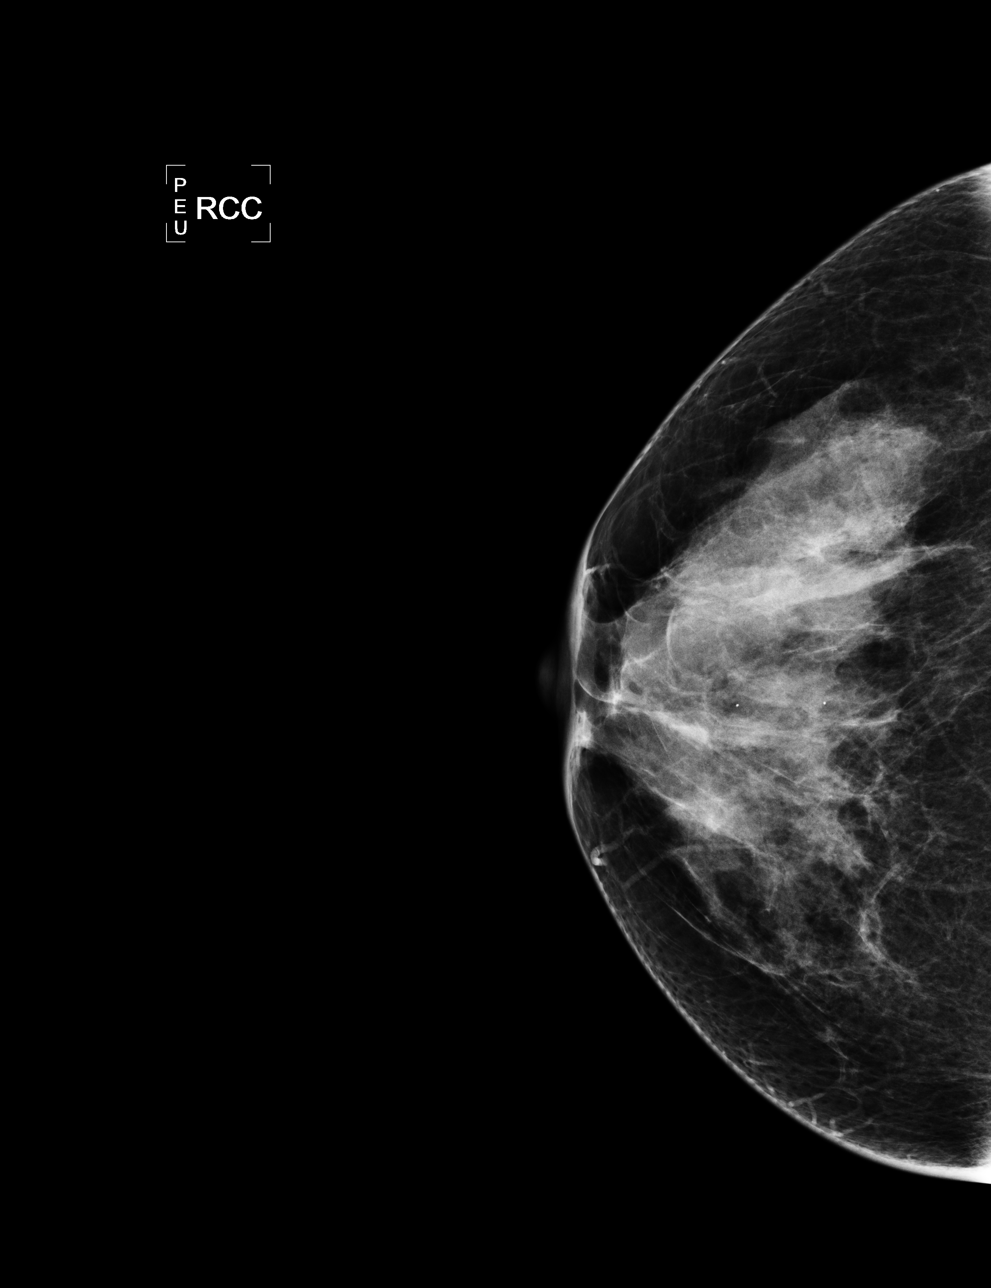

[L CC]
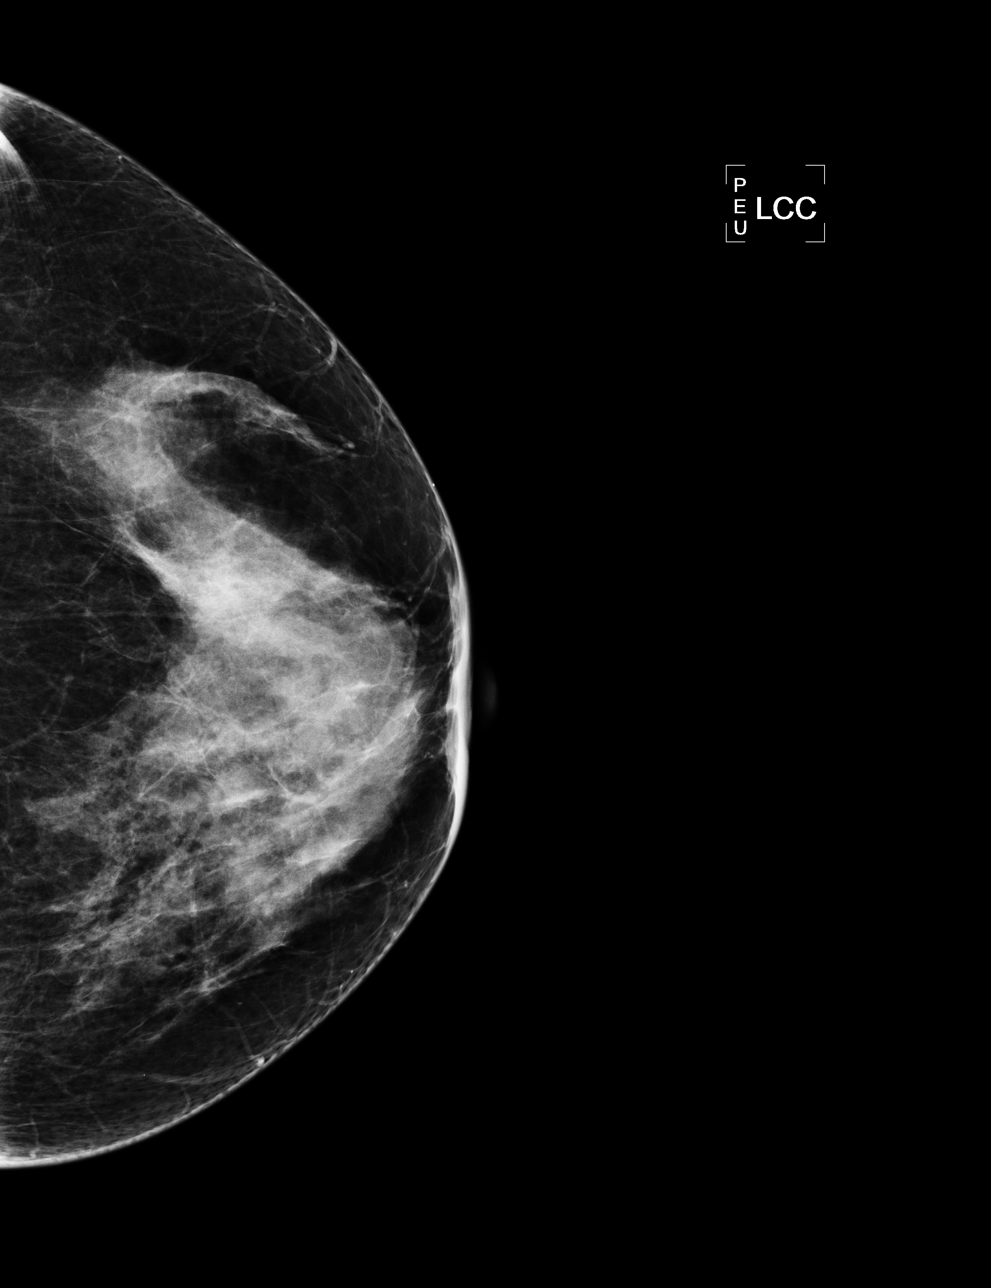

[L MLO]
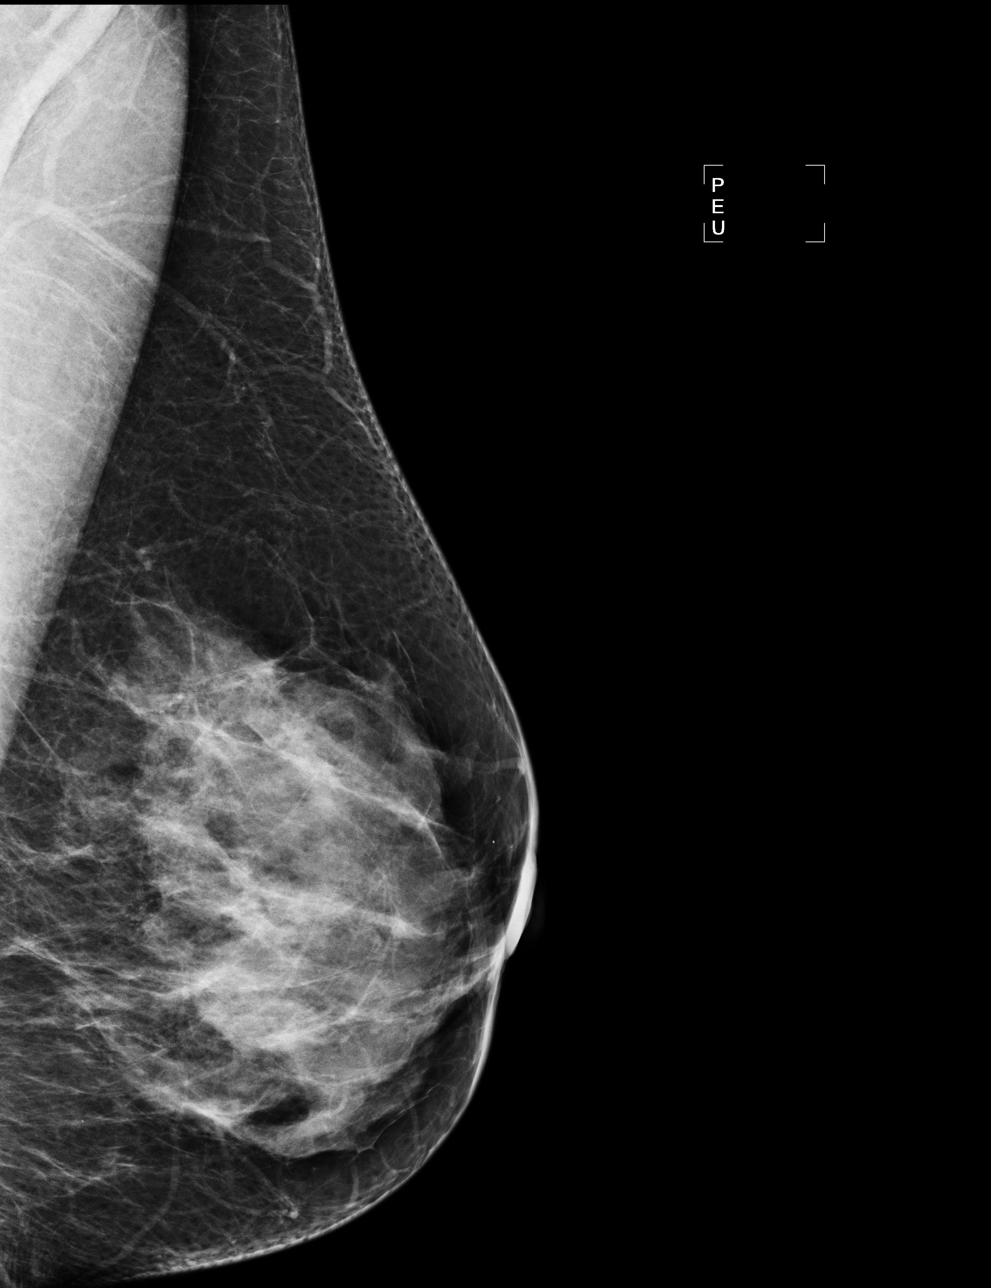

[R MLO]
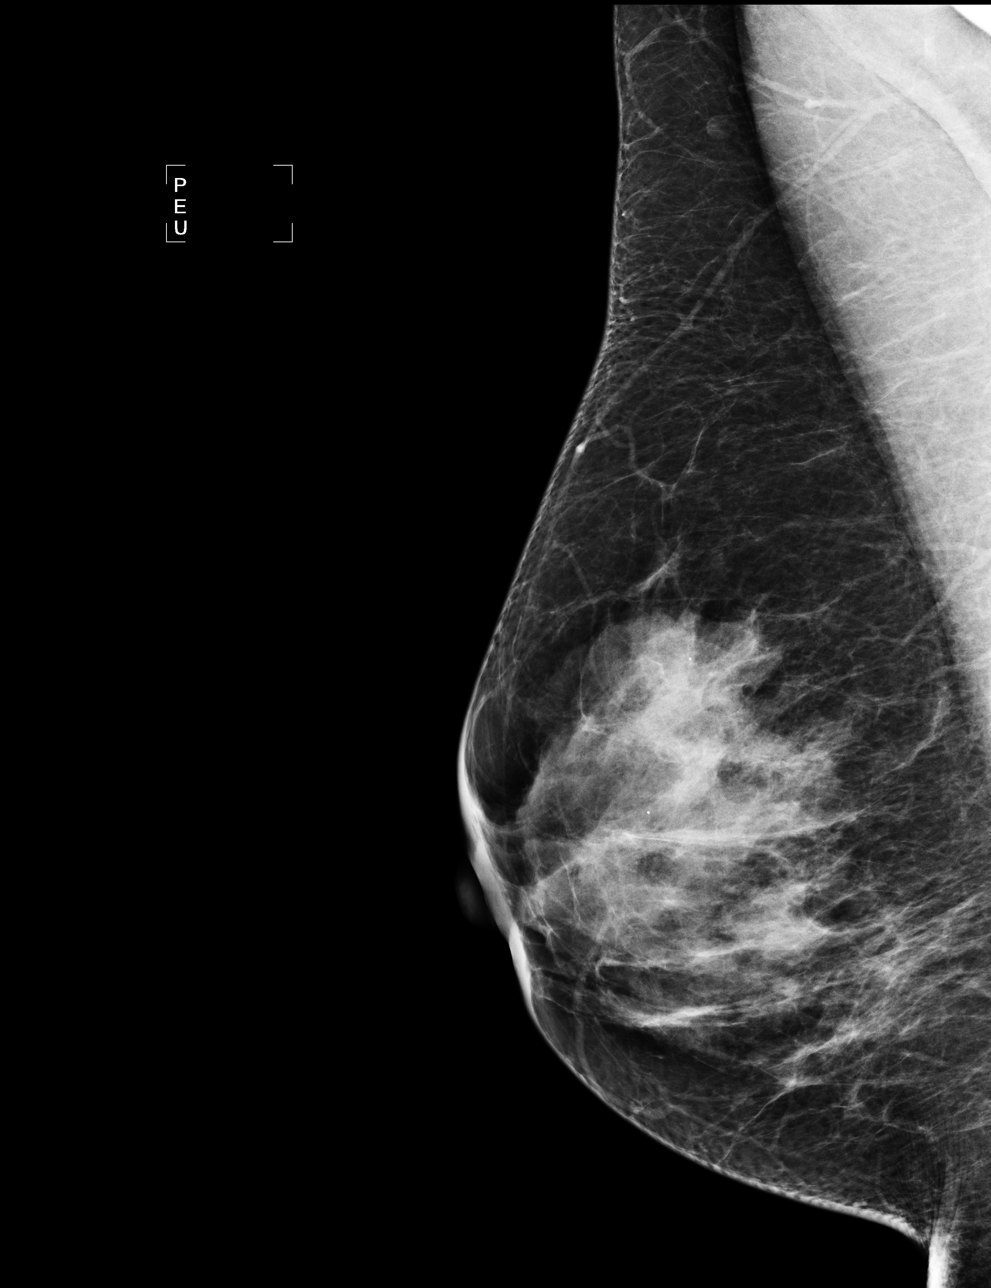

[4 of 4 positions shown; findings below may reference images not displayed]

The breast tissue is heterogeneously dense.  There is no dominant mass, architectural distortion or
calcification to suggest malignancy.

Images were processed with CAD.
IMPRESSION: No mammographic evidence of malignancy.  Suggest yearly screening mammography.

A result letter of this screening mammogram will be mailed directly to the patient.

ASSESSMENT: Negative - BI-RADS 1

Screening mammogram in 1 year.
,

## 2011-07-15 ENCOUNTER — Ambulatory Visit (HOSPITAL_COMMUNITY)
Admission: RE | Admit: 2011-07-15 | Discharge: 2011-07-15 | Disposition: A | Discharge status: Home / Self Care | Payer: Managed Care, Other (non HMO) | Source: Ambulatory Visit | Attending: Orthopaedic Surgery | Admitting: Orthopaedic Surgery

## 2011-07-15 DIAGNOSIS — IMO0001 Reserved for inherently not codable concepts without codable children: Secondary | ICD-10-CM | POA: Insufficient documentation

## 2011-07-15 DIAGNOSIS — M25569 Pain in unspecified knee: Secondary | ICD-10-CM | POA: Insufficient documentation

## 2011-07-15 DIAGNOSIS — R262 Difficulty in walking, not elsewhere classified: Secondary | ICD-10-CM | POA: Insufficient documentation

## 2011-07-15 DIAGNOSIS — M25669 Stiffness of unspecified knee, not elsewhere classified: Secondary | ICD-10-CM | POA: Insufficient documentation

## 2011-07-15 DIAGNOSIS — M6281 Muscle weakness (generalized): Secondary | ICD-10-CM | POA: Insufficient documentation

## 2011-07-15 NOTE — Evaluation (Signed)
Physical Therapy Evaluation  Patient Details  Name: Wendy Reeves MRN: [id number] Date of Birth: [birth year]  Today's Date: 07/15/2011 Time: 1308-6578 Time Calculation (min): 42 min Charges 1 eval, 10' TE Visit#: 1  of 12   Re-eval: 08/14/11 Assessment Diagnosis: R knee scope Surgical Date: 06/27/11 Next MD Visit: Dr. Cleophas Dunker  07/19/11 Prior Therapy: None  Subjective Symptoms/Limitations Symptoms: Pt reports that 2 weeks ago that she had a knee scope which Dr. Lessie Dings had a go in and do more intensive surgery on her Rt knee then compared to her L knee which she had done 2 years ago.  Her c/co is pain , difficulty walking independently, standing which will make attending work difficult.  Pain Range: 2-10/10.  nature: achy for most of her day and occasional sharp and shooting pains up the side of her leg.  PMH: Depression.  How long can you sit comfortably?: needs to keep it moving.  How long can you stand comfortably?: 10-15 minutes  How long can you walk comfortably?: 5 minutes with a SPC Pain Assessment Currently in Pain?: Yes Pain Score:   3 Pain Location: Knee Pain Orientation: Right Pain Type: Acute pain  Prior Functioning  Prior Function Able to Take Stairs?: Yes Driving: Yes Vocation: Full time employment Vocation Requirements: Needs to be able to walk far distances, climb ladders, squatting Leisure: Hobbies-yes (Comment) Comments: Enjoys walking on trails, participating in [place], enjoys being with her family.   Cognition/Observation Observation/Other Assessments Observations: Comes in walking with a SPC in her R hand Other Assessments: Atrophy to R gluteal region, increased edema to R knee.   Sensation/Coordination/Flexibility/Functional Tests Functional Tests Functional Tests: Lower Extremity Functional Scale (LEFS):  Functional Tests: 30 sec STS w/o UE support: 3x, favors her RLE  Assessment RLE AROM (degrees) RLE Overall AROM Comments: Knee Flexion:  20-90 Right Knee Extension 0-130:  (unable to achieve full extension.  -20 from full extension) RLE PROM (degrees) Right Knee Extension 0-130: 0  Right Knee Flexion 0-140: 104  RLE Strength Right Hip Flexion: 3/5 Right Hip Extension: 2/5 Right Hip ABduction: 2+/5 Right Hip ADduction: 2/5 Right Knee Flexion: 3-/5 Right Knee Extension: 3-/5 LLE Strength LLE Overall Strength Comments: WFL, 4/5 for hip adduction and abduction Palpation Palpation: Increased pain and tenderness to medial insertion of hamstrings and all along ITB.   Decreaed proximal Sup/Inf and decreased sup/inf patellar mobility  Mobility/Balance  Ambulation/Gait Ambulation/Gait: Yes Ambulation/Gait Assistance: 6: Modified independent (Device/Increase time) Assistive device: Straight cane Gait Pattern: Decreased stance time - right;Decreased hip/knee flexion - right;Decreased weight shift to right;Antalgic Static Standing Balance Single Leg Stance - Right Leg: 2  Single Leg Stance - Left Leg: 8  Tandem Stance - Right Leg: 5  Tandem Stance - Left Leg: 5  Rhomberg - Eyes Opened: 10  Rhomberg - Eyes Closed: 10   Exercise/Treatments  Supine Quad Sets: Right;5 reps (10 sec, HEP) Heel Slides: Right;10 reps (HEP) Sidelying Clams: 6x10 sec holds Prone  Hamstring Curl: 10 reps (AAROM. HEP) Other Prone Exercises: Glute sets 5x10 sec (HEP)    Physical Therapy Assessment and Plan PT Assessment and Plan Clinical Impression Statement: Pt is a 56 year old female referred to PT s/p R knee scope on 06/27/11.  After examination it was found that she has current impairments including increased pain, decreased A/PROM, decreased strength, impaired balance, decreased muscular endurance, difficulty with independent ambulation and impaired percieved functional ability which are limiting her in being able to independently participate in her household and  work related activities. Pt will benefit from skilled OP PT in order to address  above impairments in order to reach functional goals.  Rehab Potential: Good PT Frequency: Min 3X/week PT Duration: 4 weeks PT Treatment/Interventions: DME instruction;Stair training;Gait training;Functional mobility training;Therapeutic activities;Therapeutic exercise;Balance training;Neuromuscular re-education;Patient/family education;Other (comment) (Manual Techniques, Joint Mobs, Modalities PRN) PT Plan: Bike, squats, toe raises, heel raises, review HEP, manual treatment for ROM    Goals Home Exercise Program Pt will Perform Home Exercise Program: Independently PT Goal: Perform Home Exercise Program - Progress: Goal set today PT Short Term Goals Time to Complete Short Term Goals: 2 weeks PT Short Term Goal 1: Pt will report pain less than 4/10 for 50% of her day for imrpoved QOL. PT Short Term Goal 2: Pt will demonstrate R SLS on static surface x10 sec. PT Short Term Goal 3: Pt will improve R knee AROM 0-105 degrees PT Short Term Goal 4: Pt will ambulate independently with proper mechanics x5 minutes. PT Short Term Goal 5: Pt will improve R LE strength by 1 muscle grade and demonstrate SLR w/o extension lag.  PT Long Term Goals Time to Complete Long Term Goals: 4 weeks PT Long Term Goal 1: Pt will report pain less than 3/10 for while standing and walking independlty for 30 minutes in order to return to work and lesiure activities. PT Long Term Goal 2: Pt will improve R LE functional strength and ascend and descend 10 stairs 1 handrail in order to return to work activties.  Long Term Goal 3: Pt will improve her functional strength and demonstrate 5 half kneel to stand independently without an increase in knee pain.  Long Term Goal 4: Pt will improve her LE strength and endurance and demonstrate 5 STS w/o UE support in 15 sec. PT Long Term Goal 5: Pt will improve her dynamic balance and functional strength and demonstrate ascending and descending a ladder in order to return to work  activities.  Additional PT Long Term Goals?: Yes PT Long Term Goal 6: Pt will improve her LEFS score to 30/80 for improved percieved functional ability.  Problem List Patient Active Problem List  Diagnoses  . Pain in joint, lower leg  . Stiffness of joint, not elsewhere classified, lower leg  . Muscle weakness (generalized)  . Difficulty in walking    PT Plan of Care PT Home Exercise Plan: see scanned document.  (Supine SLR, Abd SLR, Clams, gluteal sets, prone knee flexion, heel slides, quad sets) Consulted and Agree with Plan of Care: Patient   Toby Breithaupt 07/15/2011, 9:41 AM  Physician Documentation Your signature is required to indicate approval of the treatment plan as stated above.  Please sign and either send electronically or make a copy of this report for your files and return this physician signed original.   Please mark one 1.__approve of plan  2. ___approve of plan with the following conditions.   ______________________________                                                          _____________________ Physician Signature  Date  

## 2011-07-16 ENCOUNTER — Ambulatory Visit (HOSPITAL_COMMUNITY)
Admission: RE | Admit: 2011-07-16 | Discharge: 2011-07-16 | Disposition: A | Discharge status: Home / Self Care | Payer: Managed Care, Other (non HMO) | Source: Ambulatory Visit | Attending: Pulmonary Disease | Admitting: Pulmonary Disease

## 2011-07-16 NOTE — Progress Notes (Signed)
Physical Therapy Treatment Patient Details  Name: Wendy Reeves MRN: [id number] Date of Birth: [birth year]  Today's Date: 07/16/2011 Time: 1600-1650 Time Calculation (min): 50 min Visit#: 2  of 12   Re-eval: 08/14/11 Charges:  therex 35', ice 10'    Subjective: Symptoms/Limitations Symptoms: Pt. states  her pain is about the same today.  Reports occasional catching around inferior knee with certain activities. Pain Assessment Currently in Pain?: Yes Pain Score:   3  Exercises Instructed by Trilby Leaver, SPTA under the direction of Taleeyah Bora Bascom Levels, PTA/CI. Exercise/Treatments Aerobic Stationary Bike: rocking 6' Standing Heel Raises: 15 reps;Limitations Heel Raises Limitations: Toeraises 15 reps Knee Flexion: 15 reps Lateral Step Up: 15 reps;Step Height: 2" Forward Step Up: 15 reps;Step Height: 2" Functional Squat: 15 reps Supine Quad Sets: 10 reps Heel Slides: 10 reps Sidelying Hip ADduction: 10 reps Clams: 8x5 sec holds Prone  Hamstring Curl: 15 reps Other Prone Exercises: TKE 15 reps X 5" Modalities Modalities: Cryotherapy Cryotherapy Number Minutes Cryotherapy: 10 Minutes Cryotherapy Location: Knee Type of Cryotherapy: Ice pack  Physical Therapy Assessment and Plan PT Assessment and Plan Clinical Impression Statement: Added new activities to increase strength.  Pt with difficulty performing step ups due to pain/weakness.  Pt. required VC's for posture/keeping head up.  Pt. unable to make full revolutions on bike today.  Did not perform PROM today due to soreness around scar/ROM progressing well at this time without need for PROM. PT Plan: Progress strength/ROM; Add hip extension, gastroc stretch and progress therex as able.     Problem List Patient Active Problem List  Diagnoses  . Pain in joint, lower leg  . Stiffness of joint, not elsewhere classified, lower leg  . Muscle weakness (generalized)  . Difficulty in walking    PT - End of Session Activity  Tolerance: Patient tolerated treatment well General Behavior During Session: [facility] for tasks performed Cognition: [facility] for tasks performed   Trilby Leaver, SPTA/ Tahari Clabaugh B. Bascom Levels, PTA 07/16/2011, 6:41 PM

## 2011-07-17 ENCOUNTER — Ambulatory Visit (HOSPITAL_COMMUNITY)
Admission: RE | Admit: 2011-07-17 | Discharge: 2011-07-17 | Disposition: A | Discharge status: Home / Self Care | Payer: Managed Care, Other (non HMO) | Source: Ambulatory Visit | Attending: Orthopaedic Surgery | Admitting: Orthopaedic Surgery

## 2011-07-17 NOTE — Progress Notes (Signed)
Physical Therapy Treatment Patient Details  Name: Wendy Reeves MRN: [id number] Date of Birth: 25-Apr-1955  Today's Date: 07/17/2011 Time: 4098-1191 Time Calculation (min): 49 min Visit#: 3  of 12   Re-eval: 08/14/11 Charges:  therex 38', ice 10'    Subjective: Symptoms/Limitations Symptoms: Pt. states she is pretty sore today.  Reports her appts. are more spread out next week. Pain Assessment Currently in Pain?: Yes Pain Score:   4 Pain Location: Knee Pain Orientation: Right  Exercises Instructed by Trilby Leaver, SPTA under the direction of Grant Henkes Bascom Levels, PTA/CI. Exercise/Treatments Aerobic Stationary Bike: rocking 6' Standing Heel Raises: 15 reps Heel Raises Limitations: Toeraises 15 reps Knee Flexion: 15 reps Lateral Step Up: 15 reps;Step Height: 2" Forward Step Up: 15 reps;Step Height: 2" Functional Squat: 15 reps Supine Quad Sets: 10 reps Short Arc Quad Sets: 15 reps Heel Slides: 10 reps Sidelying Hip ABduction: 5 reps Clams: 5X 10" hold Prone  Hamstring Curl: 15 reps Other Prone Exercises: TKE 15 reps X 5"   Modalities Modalities: Cryotherapy Cryotherapy Number Minutes Cryotherapy: 10 Minutes Cryotherapy Location: Knee Type of Cryotherapy: Ice pack  Physical Therapy Assessment and Plan PT Assessment and Plan Clinical Impression Statement: No new exercises added today due to increased soreness from back to back appts.  Pt. became tearful while performing sidelying hip abduction on R LE secondary to increased pain posterior knee.  Decreased reps of abduction to 5.  Pain decreased following ice, however pt. with continued antalgic gait when left department. PT Plan: Add prone hip extension and standing gastroc stretch next visit; progress exercises as able and add modalities as needed for pain.    Problem List Patient Active Problem List  Diagnoses  . Pain in joint, lower leg  . Stiffness of joint, not elsewhere classified, lower leg  . Muscle  weakness (generalized)  . Difficulty in walking    PT - End of Session Activity Tolerance: Patient tolerated treatment well General Behavior During Session: [facility] for tasks performed Cognition: [facility] for tasks performed   Trilby Leaver, SPTA/ Sharren Schnurr B. Bascom Levels, PTA 07/17/2011, 6:27 PM

## 2011-07-22 ENCOUNTER — Ambulatory Visit (HOSPITAL_COMMUNITY)
Admission: RE | Admit: 2011-07-22 | Discharge: 2011-07-22 | Disposition: A | Discharge status: Home / Self Care | Payer: Managed Care, Other (non HMO) | Source: Ambulatory Visit | Attending: Orthopaedic Surgery | Admitting: Orthopaedic Surgery

## 2011-07-22 NOTE — Progress Notes (Addendum)
Physical Therapy Treatment Patient Details  Name: Wendy Reeves MRN: [id number] Date of Birth: 12-30-1955  Today's Date: 07/22/2011 Time: 4098-1191 Time Calculation (min): 48 min Visit#: 4  of 12   Re-eval: 08/14/11 Charges:  therex 38', ice 10    Subjective: Symptoms/Limitations Symptoms: Pt. reports the doctor drew fluid off her knee, it is feeling much better today, however still reports pain at 4/10 Pain Assessment Currently in Pain?: Yes Pain Score:   4 Pain Location: Knee Pain Orientation: Right  Exercises Instructed by Trilby Leaver, SPTA under the direction of Money Mckeithan Bascom Levels, PTA/CI. Exercise/Treatments Stretches Gastroc Stretch: 3 reps;30 seconds;Limitations Aerobic Stationary Bike: 6'@ 2.0 full revolutions, seat 9 Standing Heel Raises: 15 reps Heel Raises Limitations: Toeraises 15 reps Knee Flexion: 15 reps Lateral Step Up: 15 reps;Step Height: 2" Forward Step Up: 10 reps;Step Height: 4" Functional Squat: 15 reps Supine Quad Sets: 10 reps Short Arc Quad Sets: 15 reps Heel Slides: 15 reps Straight Leg Raises: 10 reps Sidelying Hip ABduction: 10 reps Prone  Hamstring Curl: 15 reps Hip Extension: 10 reps Other Prone Exercises: TKE 15 reps X 5"   Modalities Modalities: Cryotherapy Cryotherapy Number Minutes Cryotherapy: 10 Minutes Cryotherapy Location: Knee Type of Cryotherapy: Ice pack  Physical Therapy Assessment and Plan PT Assessment and Plan Clinical Impression Statement: Pt. with decreased pain and improved mobility today.  Able to make full revolutions on bike; added gastroc stretch with slant board and prone hip extension without difficulty.  Lateral step ups continue to be difficult for patient.  No new exercises given for HEP today PT Plan: progress exercises next visit; work toward goals.     Problem List Patient Active Problem List  Diagnoses  . Pain in joint, lower leg  . Stiffness of joint, not elsewhere classified, lower leg  .  Muscle weakness (generalized)  . Difficulty in walking    PT - End of Session Activity Tolerance: Patient tolerated treatment well General Behavior During Session: [facility] for tasks performed Cognition: [facility] for tasks performed  Trilby Leaver, SPTA/ Josefina Rynders B. Bascom Levels, PTA 07/22/2011, 5:10 PM

## 2011-07-24 ENCOUNTER — Ambulatory Visit (HOSPITAL_COMMUNITY)
Admission: RE | Admit: 2011-07-24 | Discharge: 2011-07-24 | Disposition: A | Discharge status: Home / Self Care | Payer: Managed Care, Other (non HMO) | Source: Ambulatory Visit | Attending: Pulmonary Disease | Admitting: Pulmonary Disease

## 2011-07-24 NOTE — Progress Notes (Signed)
Physical Therapy Treatment Patient Details  Name: Wendy Reeves MRN: [id number] Date of Birth: 05/22/1955  Today's Date: 07/24/2011 Time: 4098-1191 Time Calculation (min): 52 min Visit#: 5  of 12   Re-eval: 08/14/11  Charge: therex 40 Ice 10   Subjective: Symptoms/Limitations Symptoms: Knee feeling good, pain scale 3/10 this morning.   Pain Assessment Currently in Pain?: Yes Pain Score:   3 Pain Location: Knee Pain Orientation: Right  Objective:   Exercise/Treatments Aerobic Stationary Bike: 6'@ 2.5 seat 8 for ROM Standing Heel Raises: 15 reps Heel Raises Limitations: Toeraises 15 reps Knee Flexion: 15 reps Lateral Step Up: 15 reps;Step Height: 4" Forward Step Up: 15 reps;Hand Hold: 2;Step Height: 4" Functional Squat: 15 reps SLS: 2 x 30" with intermittent HHA, max 4" with no HHA Other Standing Knee Exercises: Tandem stance 2x 30", tandem gait 1 RT Supine Quad Sets: 10 reps Short Arc Quad Sets: 15 reps Heel Slides: 15 reps Straight Leg Raises: 15 reps Sidelying Hip ABduction: 10 reps Clams: 10x 10" NMR glut med  Prone  Hamstring Curl: 15 reps Hip Extension: 15 reps Other Prone Exercises: TKE 15 reps X 5"   Modalities Modalities: Cryotherapy Cryotherapy Number Minutes Cryotherapy: 10 Minutes Cryotherapy Location: Knee Type of Cryotherapy: Ice pack  Physical Therapy Assessment and Plan PT Assessment and Plan Clinical Impression Statement: ROM is improving though still lacking full extension.  Lateral step up continues to be difficult for pt due to weakness.  Added balance activities to treatment to address impairement, pt able to tandem stance independently with min cueing for spatial awareness to assist with balance. PT Plan: Progress towards current POC.    Goals PT Short Term Goals PT Short Term Goal 3 - Progress: Progressing toward goal PT Short Term Goal 4 - Progress: Progressing toward goal  Problem List Patient Active Problem List  Diagnoses    . Pain in joint, lower leg  . Stiffness of joint, not elsewhere classified, lower leg  . Muscle weakness (generalized)  . Difficulty in walking    PT - End of Session Activity Tolerance: Patient tolerated treatment well General Behavior During Session: [facility] for tasks performed Cognition: [facility] for tasks performed  GP No functional reporting required  Juel Burrow, PTA 07/24/2011, 9:59 AM

## 2011-07-26 ENCOUNTER — Ambulatory Visit (HOSPITAL_COMMUNITY)
Admission: RE | Admit: 2011-07-26 | Discharge: 2011-07-26 | Disposition: A | Discharge status: Home / Self Care | Payer: Managed Care, Other (non HMO) | Source: Ambulatory Visit | Attending: Pulmonary Disease | Admitting: Pulmonary Disease

## 2011-07-26 NOTE — Progress Notes (Signed)
Physical Therapy Treatment Patient Details  Name: Wendy Reeves MRN: [id number] Date of Birth: 1955/05/23  Today's Date: 07/26/2011 Time: 0928-1021 Time Calculation (min): 53 min Charges: 33' TE, 10; Manual, 1 ice Visit#: 6  of 12   Re-eval: 08/14/11    Subjective: Symptoms/Limitations Symptoms: "i cant seem to get rid of this pain in the back of my knee and my walking is not quite right yet." Pt reports she plans to return to work on 5/13 and is concerned about ladders Pain Assessment Currently in Pain?: Yes Pain Location: Knee Pain Orientation: Right  Precautions/Restrictions     Exercise/Treatments Aerobic Stationary Bike: 6'@ 3.0 seat 8 for ROM Machines for Strengthening Cybex Knee Extension: 1 PL x10 BLE Cybex Knee Flexion: 3 Pl x10 Standing Knee Flexion: 20 reps;Other (comment) (TKF on 6 in step]) Lateral Step Up: Right;20 reps;Step Height: 4" Forward Step Up: Right;20 reps;Step Height: 4" Functional Squat: 10 reps (w/blue ball pick ups off 6 in step) Rocker Board: 1 minute Seated Other Seated Knee Exercises: Heel and Toe Roll in and outs 10x10 sec holds each direction Supine Bridges: 10 reps;Other (comment);Limitations (10 sec holds) Bridges Limitations: Bridging w/pelvic rotaion 2x5 BLE   Manual Therapy Manual Therapy: Myofascial release Myofascial Release: PRONE: to medial hamstring w/reports of decreased pain after treatment Cryotherapy Number Minutes Cryotherapy: 10 Minutes Cryotherapy Location: Knee Type of Cryotherapy: Ice pack  Physical Therapy Assessment and Plan PT Assessment and Plan Clinical Impression Statement: Today's treatment focus was to introduce new ther-ex to improve hip strength and improve gait mechanics.  Tolerated all new activities well w/o an increase in pain.  She demonstrates decreased endurance to her core musculature.  Educated pt to become aware of her gait and slow down and be aware of hip location.  PT Plan: Consider  Elliptical next visit to improve stride length and gait mechanics and gastroc stretch.    Goals    Problem List Patient Active Problem List  Diagnoses  . Pain in joint, lower leg  . Stiffness of joint, not elsewhere classified, lower leg  . Muscle weakness (generalized)  . Difficulty in walking       GP No functional reporting required  Wendy Reeves 07/26/2011, 10:17 AM

## 2011-07-29 ENCOUNTER — Ambulatory Visit (HOSPITAL_COMMUNITY)
Admission: RE | Admit: 2011-07-29 | Discharge: 2011-07-29 | Disposition: A | Discharge status: Home / Self Care | Payer: Managed Care, Other (non HMO) | Source: Ambulatory Visit | Attending: Orthopaedic Surgery | Admitting: Orthopaedic Surgery

## 2011-07-29 NOTE — Progress Notes (Signed)
Physical Therapy Treatment Patient Details  Name: Wendy Reeves MRN: [id number] Date of Birth: 1955/07/01  Today's Date: 07/29/2011 Time: 1112-1214 Time Calculation (min): 62 min Charges: TE x 44', Manual x8' Ice x10 min Visit#: 7  of 12   Re-eval: 08/14/11    Subjective: Symptoms/Limitations Symptoms: Pt reports that she is a little sore from her activities this weekend.  Pain Assessment Currently in Pain?: Yes Pain Location: Knee  Exercise/Treatments Aerobic Elliptical: 7' 2.0 for muscular endruance and stride length Machines for Strengthening Cybex Knee Extension: 2 PL 2x10 Cybex Knee Flexion: 3 Pl 2x15 Standing Step Down: 15 reps;Step Height: 6" Functional Squat: 20 reps (w/blue ball on 6 in. step) Stairs: 1 RT w/HHA Rocker Board: 2 minutes Gait Training: Heel/Toe Walking 2 RT each; tandem gait on balance beam 2 RT Other Standing Knee Exercises: Standing Clams 5x10 sec holds BLE Supine Bridges: 10 reps (10 sec holds) Bridges Limitations: Bridging w/pelvic rotaion 2x5 BLE   Manual Therapy Manual Therapy: Myofascial release Myofascial Release: Supine to medial knee Cryotherapy Number Minutes Cryotherapy: 10 Minutes Cryotherapy Location: Knee Type of Cryotherapy: Ice pack  Physical Therapy Assessment and Plan PT Assessment and Plan Clinical Impression Statement: Today's treatment focus was on general LE strengthening/muscular endurance and gait mechanics.  She has significant difficulty with eccentric quad control.  Tolerated increased sets and weight without an increase in pain.   She reports decreased pain after treatment today.  PT Plan: Add vector stance to improve gluteal strength and add gastroc stretch    Goals    Problem List Patient Active Problem List  Diagnoses  . Pain in joint, lower leg  . Stiffness of joint, not elsewhere classified, lower leg  . Muscle weakness (generalized)  . Difficulty in walking    PT - End of Session Activity  Tolerance: Patient tolerated treatment well  GP No functional reporting required  Juda Lajeunesse 07/29/2011, 12:07 PM

## 2011-07-30 ENCOUNTER — Telehealth (HOSPITAL_COMMUNITY): Payer: Self-pay

## 2011-07-30 ENCOUNTER — Inpatient Hospital Stay (HOSPITAL_COMMUNITY)
Admission: RE | Admit: 2011-07-30 | Payer: Managed Care, Other (non HMO) | Source: Ambulatory Visit | Admitting: Physical Therapy

## 2011-07-31 ENCOUNTER — Ambulatory Visit (HOSPITAL_COMMUNITY): Payer: Managed Care, Other (non HMO) | Admitting: *Deleted

## 2011-08-02 ENCOUNTER — Telehealth (HOSPITAL_COMMUNITY): Payer: Self-pay | Admitting: Physical Therapy

## 2011-08-02 ENCOUNTER — Ambulatory Visit (HOSPITAL_COMMUNITY): Payer: Managed Care, Other (non HMO) | Admitting: Physical Therapy

## 2011-08-05 ENCOUNTER — Ambulatory Visit (HOSPITAL_COMMUNITY)
Admission: RE | Admit: 2011-08-05 | Discharge: 2011-08-05 | Disposition: A | Discharge status: Home / Self Care | Payer: Managed Care, Other (non HMO) | Source: Ambulatory Visit | Attending: Orthopaedic Surgery | Admitting: Orthopaedic Surgery

## 2011-08-05 DIAGNOSIS — R262 Difficulty in walking, not elsewhere classified: Secondary | ICD-10-CM | POA: Insufficient documentation

## 2011-08-05 DIAGNOSIS — M6281 Muscle weakness (generalized): Secondary | ICD-10-CM | POA: Insufficient documentation

## 2011-08-05 DIAGNOSIS — M25569 Pain in unspecified knee: Secondary | ICD-10-CM | POA: Insufficient documentation

## 2011-08-05 DIAGNOSIS — M25669 Stiffness of unspecified knee, not elsewhere classified: Secondary | ICD-10-CM | POA: Insufficient documentation

## 2011-08-05 DIAGNOSIS — IMO0001 Reserved for inherently not codable concepts without codable children: Secondary | ICD-10-CM | POA: Insufficient documentation

## 2011-08-05 NOTE — Progress Notes (Signed)
Physical Therapy Treatment Patient Details  Name: Wendy Reeves MRN: [id number] Date of Birth: 07-25-1955  Today's Date: 08/05/2011 Time: 6295-2841 PT Time Calculation (min): 43 min  Visit#: 8  of 12   Re-eval: 08/14/11 Diagnosis: R knee scope Surgical Date: 06/27/11 Next MD Visit: 09/02/11 Charges:  therex 38'   Subjective: Symptoms/Limitations Symptoms: Pt. states she is hurting/stiff today but it is about the same.  Reports she has missed last weeks appts. due to the norovirus.  Pt. reports her MD has released her to return to work on Monday, May 13 (1 week for today). Pain Assessment Currently in Pain?: Yes Pain Score:   3 Pain Location: Knee Pain Orientation: Right   Exercise/Treatments Aerobic Elliptical: 7' 2.0 for muscular endruance and stride length Machines for Strengthening Cybex Knee Extension: 2 PL 2x15 Cybex Knee Flexion: 3 Pl 2x15 Standing Knee Flexion: 20 reps;Other (comment);Limitations Knee Flexion Limitations: TKF on 6" step Lateral Step Up: Right;15 reps;Step Height: 6" Forward Step Up: Right;15 reps;Step Height: 6" Step Down: Right;15 reps;Step Height: 4" Stairs: 2 flights reciprocally Rocker Board: 2 minutes Gait Training: Heel/Toe Walking 2 RT each; tandem gait on balance beam 2 RT   Physical Therapy Assessment and Plan PT Assessment and Plan Clinical Impression Statement: Able to increase to 6" step for forward and lateral step ups; still unable to advance with step downs due to decreased eccentric quad control.  Pt. did , however have improved control/technique with reciprocal stair climbing using 1 UE.  Pt. returned to MD who was overall  pleased with progress and is releasing back to work X 1 week from today.  PT Plan: Continue; add gastroc stretch and functional activites related to RTW.     Problem List Patient Active Problem List  Diagnoses  . Pain in joint, lower leg  . Stiffness of joint, not elsewhere classified, lower leg  . Muscle  weakness (generalized)  . Difficulty in walking    PT - End of Session Activity Tolerance: Patient tolerated treatment well General Behavior During Session: [facility] for tasks performed Cognition: [facility] for tasks performed   Gennaro Lizotte B. Bascom Levels, PTA 08/05/2011, 2:28 PM

## 2011-08-07 ENCOUNTER — Ambulatory Visit (HOSPITAL_COMMUNITY)
Admission: RE | Admit: 2011-08-07 | Discharge: 2011-08-07 | Disposition: A | Discharge status: Home / Self Care | Payer: Managed Care, Other (non HMO) | Source: Ambulatory Visit | Attending: Pulmonary Disease | Admitting: Pulmonary Disease

## 2011-08-07 NOTE — Progress Notes (Signed)
Physical Therapy Treatment Patient Details  Name: Wendy Reeves MRN: [id number] Date of Birth: 05-29-1955  Today's Date: 08/07/2011 Time: 0935-1020 PT Time Calculation (min): 45 min  Visit#: 9  of 12   Re-eval: 08/14/11 Charge: therex 38 min   Subjective: Symptoms/Limitations Symptoms: Pt stated she is mininal pain medial postion of R knee maybe 1-2/10.  Pt to return to work on Monday, has sheet with restrictions for therapists to complete this tx. Pain Assessment Currently in Pain?: Yes Pain Score:   2 Pain Location: Knee Pain Orientation: Right  Objective:   Exercise/Treatments Aerobic Elliptical: 7' L2 for muscular endruance and stride length Machines for Strengthening Cybex Knee Extension: 2 PL 2x15 Cybex Knee Flexion: 3.5 Pl 2x15 Standing Forward Lunges: Right;10 reps Lateral Step Up: Right;15 reps;Step Height: 6" Forward Step Up: Right;15 reps;Step Height: 6" Step Down: Right;15 reps;Step Height: 4" Functional Squat: 20 reps;Limitations Functional Squat Limitations: picking up ball off 6 in Stairs: 2 flights reciprocally 1 HR Rocker Board: 2 minutes;Limitations Rocker Board Limitations: R/L     Physical Therapy Assessment and Plan PT Assessment and Plan Clinical Impression Statement: Session focus on functional strengthening for RTW next week.  Added forward lunges for strengthening, pt able to complete following cueing for form with instability noted.  Weak quad eccentric control noted descending stairwell.  Faxed restrictions form from doctor with no kneeling or crawling at work until released from [employer]. PT Plan: Continue with functional activities, add gastroc stretch next session.    Goals    Problem List Patient Active Problem List  Diagnoses  . Pain in joint, lower leg  . Stiffness of joint, not elsewhere classified, lower leg  . Muscle weakness (generalized)  . Difficulty in walking    PT - End of Session Activity Tolerance: Patient tolerated  treatment well General Behavior During Session: [facility] for tasks performed Cognition: [facility] for tasks performed  GP No functional reporting required  Juel Burrow, PTA 08/07/2011, 12:12 PM

## 2011-08-09 ENCOUNTER — Ambulatory Visit (HOSPITAL_COMMUNITY)
Admission: RE | Admit: 2011-08-09 | Discharge: 2011-08-09 | Disposition: A | Discharge status: Home / Self Care | Payer: Managed Care, Other (non HMO) | Source: Ambulatory Visit

## 2011-08-09 NOTE — Progress Notes (Signed)
Physical Therapy Treatment Patient Details  Name: ZYA FINKLE MRN: [id number] Date of Birth: Aug 16, 1955  Today's Date: 08/09/2011 Time: 0935-1030 PT Time Calculation (min): 55 min  Visit#: 10  of 12   Re-eval: 08/14/11  Charge: therex 39 min Ice 10 min  Subjective: Symptoms/Limitations Symptoms: Pt stated she worked out in yard yesterday so a little sore today, no pain to report. Pain Assessment Currently in Pain?: No/denies  Objective:   Exercise/Treatments Aerobic Elliptical: 7' L2 for muscular endruance and stride length Machines for Strengthening Cybex Knee Extension: 2 PL 2x10 B extend, R eccentric lowering Cybex Knee Flexion: 4 Pl 20 reps Standing Forward Lunges: Both;2 sets;10 reps;3 seconds Lateral Step Up: Right;15 reps;Step Height: 6" Forward Step Up: Right;15 reps;Step Height: 6" Step Down: Right;15 reps;Hand Hold: 1;Step Height: 6" Functional Squat: 20 reps;Limitations Stairs: 2 flights reciprocally 1 HR Rocker Board: 2 minutes;Limitations Rocker Board Limitations: R/L   Modalities Modalities: Cryotherapy Cryotherapy Number Minutes Cryotherapy: 10 Minutes Cryotherapy Location: Knee Type of Cryotherapy: Ice pack  Physical Therapy Assessment and Plan PT Assessment and Plan Clinical Impression Statement: Focus on functional strengthening for RTW next Monday.  Pt still has difficulty with forward lunge to kneeling due to quad weakness.  Progressed cybex quad to BLE extend and R eccentric lowering with increase visible quad fatigue noted. PT Plan: Assess return to work next week, continue with functional strengthening.  Add gastroc stretch next session.    Goals    Problem List Patient Active Problem List  Diagnoses  . Pain in joint, lower leg  . Stiffness of joint, not elsewhere classified, lower leg  . Muscle weakness (generalized)  . Difficulty in walking    PT - End of Session Activity Tolerance: Patient tolerated treatment  well General Behavior During Session: [facility] for tasks performed Cognition: [facility] for tasks performed  GP No functional reporting required  Juel Burrow 08/09/2011, 12:54 PM

## 2011-08-12 ENCOUNTER — Ambulatory Visit (HOSPITAL_COMMUNITY)
Admission: RE | Admit: 2011-08-12 | Discharge: 2011-08-12 | Disposition: A | Discharge status: Home / Self Care | Payer: Managed Care, Other (non HMO) | Source: Ambulatory Visit | Attending: Pulmonary Disease | Admitting: Pulmonary Disease

## 2011-08-12 ENCOUNTER — Ambulatory Visit (HOSPITAL_COMMUNITY): Payer: Managed Care, Other (non HMO) | Admitting: Physical Therapy

## 2011-08-12 NOTE — Evaluation (Signed)
Physical Therapy Evaluation  Patient Details  Name: LUVIA ORZECHOWSKI MRN: [id number] Date of Birth: 09-Apr-1955  Today's Date: 08/12/2011 Time: 1620-1700 PT Time Calculation (min): 40 min  Visit#: 11  of 19   Re-eval: 09/11/11 Assessment Diagnosis: R knee scope Surgical Date: 06/27/11 Next MD Visit: 09/02/11   Past Medical History: No past medical history on file. Past Surgical History: No past surgical history on file.  Subjective Symptoms/Limitations Symptoms: Pt states she is sore today; first day back at work.   How long can you sit comfortably?: Pt states that she is able to sit for as long as she wants she was having to constantly move her knee. How long can you stand comfortably?: Pt states that she is able to stand for twenty minutes at a time was 10 minutes. How long can you walk comfortably?: Pt is now walking without an assistive device and is able to walk for fifteen to twenty minutes where she was walking five minutes with a SPC Pain Assessment Currently in Pain?: Yes Pain Score:   2 (finished a full day at work) Pain Location: Knee Pain Orientation: Right   Sensation/Coordination/Flexibility/Functional Tests Functional Tests Functional Tests: 30 sec sit to stand  8.  LEFS:  49/80 Assessment RLE AROM (degrees) Right Knee Extension 0-130: 4  (L is at 4) Right Knee Flexion 0-140: 130  RLE Strength Right Hip Flexion: 4/5 (was 3/5) Right Hip Extension: 3+/5 (2/5 at eval) Right Hip ABduction: 5/5 (was 2+/5) Right Hip ADduction: 5/5 (2/5) Right Knee Flexion: 3+/5 (3-/5) Right Knee Extension: 3+/5 (was 3-/5)  Exercise/Treatments Mobility/Balance  Static Standing Balance Single Leg Stance - Right Leg: 5  (was 2) Single Leg Stance - Left Leg: 21  (was 8) Tandem Stance - Right Leg: 26  (was 5) Tandem Stance - Left Leg: 35  (was 5)    Physical Therapy Assessment and Plan PT Assessment and Plan Clinical Impression Statement: Pt has improved in overall strength and  ROM.  focus needs to be on functional strengthening.  Hip ext and knee mm primary focus. Rehab Potential: Good PT Frequency: Min 2X/week PT Duration: 4 weeks PT Treatment/Interventions: Functional mobility training;Therapeutic exercise;Stair training PT Plan: complete lunges. 1/4 lunge walk, stair climbing, ....    Goals Home Exercise Program PT Goal: Perform Home Exercise Program - Progress: Progressing toward goal (stopped a couple weeks ago more walking now.) PT Short Term Goals PT Short Term Goal 1 - Progress: Met PT Short Term Goal 2 - Progress: Met PT Short Term Goal 3: ROM 4-130 but L LE is at 4 as well PT Short Term Goal 3 - Progress: Partly met PT Short Term Goal 4 - Progress: Met PT Short Term Goal 5: hip mm has improved one grade but knee mm has only improved 1/2 grade. PT Short Term Goal 5 - Progress: Progressing toward goal PT Long Term Goals PT Long Term Goal 1 - Progress: Met PT Long Term Goal 2: ascending is easier than descend which is still difficutl PT Long Term Goal 2 - Progress: Partly met Long Term Goal 3: Pt is unable to complete one half kneel without UE support goal was for 5. Long Term Goal 4 Progress: Met PT Long Term Goal 5: Pt has not tried to go up or down a ladder yet.  She is on restriciton from doing the activity at this time. Long Term Goal 5 Progress: Not met PT Long Term Goal 6: LEFS has increased to 49/80 new goal is  to be 59/64 with taking running and hopping activites 16,17,18,19 out of LEFS  Problem List Patient Active Problem List  Diagnoses  . Pain in joint, lower leg  . Stiffness of joint, not elsewhere classified, lower leg  . Muscle weakness (generalized)  . Difficulty in walking    PT - End of Session Activity Tolerance: Patient tolerated treatment well General Behavior During Session: [facility] for tasks performed Cognition: [facility] for tasks performed PT Plan of Care PT Home Exercise Plan: given to focul on deficits. Consulted and  Agree with Plan of Care: Patient    Tracen Mahler,CINDY 08/12/2011, 5:06 PM  Physician Documentation Your signature is required to indicate approval of the treatment plan as stated above.  Please sign and either send electronically or make a copy of this report for your files and return this physician signed original.   Please mark one 1.__approve of plan  2. ___approve of plan with the following conditions.   ______________________________                                                          _____________________ Physician Signature                                                                                                             Date

## 2011-08-14 ENCOUNTER — Ambulatory Visit (HOSPITAL_COMMUNITY): Payer: Managed Care, Other (non HMO)

## 2011-08-14 ENCOUNTER — Ambulatory Visit (HOSPITAL_COMMUNITY)
Admission: RE | Admit: 2011-08-14 | Discharge: 2011-08-14 | Disposition: A | Discharge status: Home / Self Care | Payer: Managed Care, Other (non HMO) | Source: Ambulatory Visit | Attending: Pulmonary Disease | Admitting: Pulmonary Disease

## 2011-08-14 NOTE — Progress Notes (Signed)
Physical Therapy Treatment Patient Details  Name: Wendy Reeves MRN: [id number] Date of Birth: 30-Oct-1955  Today's Date: 08/14/2011 Time: 1600-1700 PT Time Calculation (min): 60 min  Visit#: 12  of 19   Re-eval: 09/11/11  Charge: therex 43 min Ice 10 min   Subjective: Symptoms/Limitations Symptoms: Pt reported at the end of day Monday she felt as though she could barely walk with first day back at work.  No pain today just c/o tight gastroc. Pain Assessment Currently in Pain?: No/denies  Objective:   Exercise/Treatments Aerobic Elliptical: 7' L3 for muscular endruance and stride length Machines for Strengthening Cybex Knee Extension: 2.5 Pl 2 x15 Cybex Knee Flexion: 4.5 Pl 2x 15 Standing Forward Lunges: Both;2 sets;10 reps;3 seconds Lateral Step Up: Right;15 reps;Step Height: 6" Forward Step Up: Right;15 reps;Step Height: 6" Step Down: Right;15 reps;Hand Hold: 1;Step Height: 6" Stairs: 2 flights reciprocally 1 HR Rocker Board: 2 minutes;Limitations Rocker Board Limitations: R/L SLS with Vectors: R SLS 5x 10"   Modalities Modalities: Cryotherapy Cryotherapy Number Minutes Cryotherapy: 10 Minutes Cryotherapy Location: Knee Type of Cryotherapy: Ice pack  Physical Therapy Assessment and Plan PT Assessment and Plan Clinical Impression Statement: Treatment focus on functional strengthening, pt with good form and technique noted with most activities pt still has difficulty with eccentric control descending stairs.  Added vector stance to address R LE stability and strengthen hip mm, with min cueing for form. PT Plan: Continue with current POC, progress lunges to lunge walk, continue stair climbing and encourage proper gait mechanics without limp.    Goals    Problem List Patient Active Problem List  Diagnoses  . Pain in joint, lower leg  . Stiffness of joint, not elsewhere classified, lower leg  . Muscle weakness (generalized)  . Difficulty in walking    PT -  End of Session Activity Tolerance: Patient tolerated treatment well General Behavior During Session: [facility] for tasks performed Cognition: [facility] for tasks performed  GP No functional reporting required  Juel Burrow, PTA 08/14/2011, 5:29 PM

## 2011-08-16 ENCOUNTER — Ambulatory Visit (HOSPITAL_COMMUNITY): Payer: Managed Care, Other (non HMO) | Admitting: Physical Therapy

## 2011-08-16 ENCOUNTER — Ambulatory Visit (HOSPITAL_COMMUNITY)
Admission: RE | Admit: 2011-08-16 | Discharge: 2011-08-16 | Disposition: A | Discharge status: Home / Self Care | Payer: Managed Care, Other (non HMO) | Source: Ambulatory Visit | Attending: Orthopaedic Surgery | Admitting: Orthopaedic Surgery

## 2011-08-16 NOTE — Progress Notes (Signed)
Physical Therapy Treatment Patient Details  Name: Wendy Reeves MRN: [id number] Date of Birth: 07/25/1955  Today's Date: 08/16/2011 Time: 1400-1430 PT Time Calculation (min): 30 min Charges: TE:30' Visit#: 13  of 19   Re-eval: 09/11/11    Subjective: Symptoms/Limitations Symptoms: Pt reports that she has returned to work and is doing okay.  She has the most difficulty mostly due to her restrictions (no climbing ladders, no kneeling) Pain Assessment Currently in Pain?: Yes Pain Score:   1 Pain Location: Knee Pain Orientation: Right Pain Type: Acute pain  Exercise/Treatments Aerobic Elliptical: 7' L3 for muscular endruance and stride length Machines for Strengthening Cybex Knee Extension: 1 PL w/ RLE eccecntric lowering Standing Forward Lunges: Both;5 reps Forward Step Up: Right;15 reps;Step Height: 6";Other (comment) (increased trunk flexion) Gait Training: Up and down ramp forward x4 RT, Retro (up only) 3 RT, side stepping 3 RT  Physical Therapy Assessment and Plan PT Assessment and Plan Clinical Impression Statement: Treatment focus on improving functional strength to her hips.  Pt continues to demonstrate increased weakness to hip flexors and adductors. Added ramp activities to simulate uneven surface climbing at work.  PT Plan: Cont to improve functional strength and proper gait mechanics.     Goals    Problem List Patient Active Problem List  Diagnoses  . Pain in joint, lower leg  . Stiffness of joint, not elsewhere classified, lower leg  . Muscle weakness (generalized)  . Difficulty in walking       GP No functional reporting required  Resha Filippone 08/16/2011, 2:36 PM

## 2011-08-19 ENCOUNTER — Ambulatory Visit (HOSPITAL_COMMUNITY)
Admission: RE | Admit: 2011-08-19 | Discharge: 2011-08-19 | Disposition: A | Discharge status: Home / Self Care | Payer: Managed Care, Other (non HMO) | Source: Ambulatory Visit | Attending: *Deleted | Admitting: *Deleted

## 2011-08-19 NOTE — Progress Notes (Signed)
Physical Therapy Treatment Patient Details  Name: Wendy Reeves MRN: [id number] Date of Birth: 04-Apr-1955  Today's Date: 08/19/2011 Time: 1608-1700 PT Time Calculation (min): 52 min Visit#: 14  of 19   Re-eval: 09/11/11 Charges: Therex x 32' Ice x 10'   Subjective: Symptoms/Limitations Symptoms: I've started having pain in my on the inside of my lower leg. Pain Assessment Pain Score:   3 Pain Location: Leg Pain Orientation: Right;Lower   Exercise/Treatments Aerobic Elliptical: 8' L3 for muscular endruance and stride length Machines for Strengthening Cybex Knee Extension: 1 PL w/ RLE eccecntric lowering x15 Cybex Knee Flexion: 4.5 Pl 2x 15 Standing Forward Lunges: Both;5 reps Lateral Step Up: Right;15 reps;Step Height: 6" Forward Step Up: Right;15 reps;Step Height: 6";Other (comment) Step Down: Right;15 reps;Hand Hold: 1;Step Height: 6" Wall Squat: 10 reps;5 seconds Rocker Board: 2 minutes;Limitations Rocker Board Limitations: R/L SLS with Vectors: R SLS 5x 20"   Modalities Modalities: Cryotherapy Cryotherapy Number Minutes Cryotherapy: 10 Minutes Cryotherapy Location: Knee (Right) Type of Cryotherapy: Ice pack  Physical Therapy Assessment and Plan PT Assessment and Plan Clinical Impression Statement: Pt completes all therex with good form and minimal need for cueing. Pt is eager to receive new exercises for home. HEP given for lunges, wall slides and step ups. Pt is without complaint throughout session. Ice applied to R knee at end of session to limit pain/swelling. Pt reports no increase in pain at end of session. PT Plan: Conitnue to improve strength and gait mechanics per PT POC.     Problem List Patient Active Problem List  Diagnoses  . Pain in joint, lower leg  . Stiffness of joint, not elsewhere classified, lower leg  . Muscle weakness (generalized)  . Difficulty in walking    PT - End of Session Activity Tolerance: Patient tolerated treatment  well General Behavior During Session: [facility] for tasks performed Cognition: [facility] for tasks performed   Seth Bake, PTA 08/19/2011, 5:14 PM

## 2011-08-21 ENCOUNTER — Inpatient Hospital Stay (HOSPITAL_COMMUNITY): Admission: RE | Admit: 2011-08-21 | Payer: Managed Care, Other (non HMO) | Source: Ambulatory Visit

## 2011-08-23 ENCOUNTER — Ambulatory Visit (HOSPITAL_COMMUNITY): Payer: Managed Care, Other (non HMO)

## 2012-01-30 ENCOUNTER — Other Ambulatory Visit: Payer: Self-pay | Admitting: Obstetrics and Gynecology

## 2012-01-30 DIAGNOSIS — Z1231 Encounter for screening mammogram for malignant neoplasm of breast: Secondary | ICD-10-CM

## 2012-02-25 ENCOUNTER — Ambulatory Visit
Admission: RE | Admit: 2012-02-25 | Discharge: 2012-02-25 | Disposition: A | Discharge status: Home / Self Care | Payer: Managed Care, Other (non HMO) | Source: Ambulatory Visit | Attending: Obstetrics and Gynecology | Admitting: Obstetrics and Gynecology

## 2012-02-25 DIAGNOSIS — Z1231 Encounter for screening mammogram for malignant neoplasm of breast: Secondary | ICD-10-CM

## 2012-03-02 ENCOUNTER — Encounter (HOSPITAL_COMMUNITY): Payer: Self-pay

## 2012-03-03 ENCOUNTER — Encounter (HOSPITAL_COMMUNITY)
Admission: RE | Admit: 2012-03-03 | Discharge: 2012-03-03 | Payer: Managed Care, Other (non HMO) | Source: Ambulatory Visit | Attending: Orthopaedic Surgery | Admitting: Orthopaedic Surgery

## 2012-03-03 NOTE — Pre-Procedure Instructions (Signed)
20 Wendy Reeves  03/03/2012   Your procedure is scheduled on:  Tuesday March 17, 2012 at 0730 AM  Report to [facility] at 0530 AM.  Call this number if you have problems the morning of surgery: [phone]   Remember:   Do not eat food or drink:After Midnight.Monday      Take these medicines the morning of surgery with A SIP OF WATER: Cymbalta   Do not wear jewelry, make-up or nail polish.  Do not wear lotions, powders, or perfumes. You may wear deodorant.  Do not shave 48 hours prior to surgery.   Do not bring valuables to the hospital.  Contacts, dentures or bridgework may not be worn into surgery.  Leave suitcase in the car. After surgery it may be brought to your room.  For patients admitted to the hospital, checkout time is 11:00 AM the day of discharge.   Patients discharged the day of surgery will not be allowed to drive home.    Special Instructions: Incentive Spirometry - Practice and bring it with you on the day of surgery. Shower using CHG 2 nights before surgery and the night before surgery.  If you shower the day of surgery use CHG.  Use special wash - you have one bottle of CHG for all showers.  You should use approximately 1/3 of the bottle for each shower.   Please read over the following fact sheets that you were given: Pain Booklet, Coughing and Deep Breathing, Blood Transfusion Information, MRSA Information and Surgical Site Infection Prevention

## 2012-03-11 ENCOUNTER — Encounter (HOSPITAL_COMMUNITY)
Admission: RE | Admit: 2012-03-11 | Discharge: 2012-03-11 | Disposition: A | Discharge status: Home / Self Care | Payer: Managed Care, Other (non HMO) | Source: Ambulatory Visit | Attending: Orthopedic Surgery | Admitting: Orthopedic Surgery

## 2012-03-11 ENCOUNTER — Encounter (HOSPITAL_COMMUNITY): Payer: Self-pay

## 2012-03-11 ENCOUNTER — Encounter (HOSPITAL_COMMUNITY)
Admission: RE | Admit: 2012-03-11 | Discharge: 2012-03-11 | Disposition: A | Discharge status: Home / Self Care | Payer: Managed Care, Other (non HMO) | Source: Ambulatory Visit | Attending: Orthopaedic Surgery | Admitting: Orthopaedic Surgery

## 2012-03-11 HISTORY — DX: Personal history of other medical treatment: Z92.89

## 2012-03-11 HISTORY — DX: Major depressive disorder, single episode, unspecified: F32.9

## 2012-03-11 HISTORY — DX: Unspecified osteoarthritis, unspecified site: M19.90

## 2012-03-11 HISTORY — DX: Depression, unspecified: F32.A

## 2012-03-11 HISTORY — DX: Personal history of other diseases of the digestive system: Z87.19

## 2012-03-11 LAB — CBC
Hemoglobin: 13.6 g/dL (ref 12.0–15.0)
MCH: 28.6 pg (ref 26.0–34.0)
MCHC: 32.9 g/dL (ref 30.0–36.0)
MCV: 87 fL (ref 78.0–100.0)

## 2012-03-11 LAB — COMPREHENSIVE METABOLIC PANEL
ALT: 18 U/L (ref 0–35)
Calcium: 9.3 mg/dL (ref 8.4–10.5)
Creatinine, Ser: 0.59 mg/dL (ref 0.50–1.10)
GFR calc Af Amer: >90 mL/min (ref >90)
GFR calc non Af Amer: >90 mL/min (ref >90)
Glucose, Bld: 150 mg/dL — ABNORMAL HIGH (ref 70–99)
Sodium: 139 mEq/L (ref 135–145)
Total Protein: 7 g/dL (ref 6.0–8.3)

## 2012-03-11 LAB — URINE MICROSCOPIC-ADD ON

## 2012-03-11 LAB — TYPE AND SCREEN
ABO/RH(D): A POS
Antibody Screen: NEGATIVE

## 2012-03-11 LAB — URINALYSIS, ROUTINE W REFLEX MICROSCOPIC
Bilirubin Urine: NEGATIVE
Glucose, UA: NEGATIVE mg/dL
Ketones, ur: 15 mg/dL — AB
Protein, ur: NEGATIVE mg/dL
Urobilinogen, UA: 0.2 mg/dL (ref 0.0–1.0)

## 2012-03-11 LAB — PROTIME-INR
INR: 1.01 (ref 0.00–1.49)
Prothrombin Time: 13.2 seconds (ref 11.6–15.2)

## 2012-03-11 LAB — SURGICAL PCR SCREEN: MRSA, PCR: NEGATIVE

## 2012-03-11 NOTE — Pre-Procedure Instructions (Signed)
20 LIESE DIZDAREVIC  03/11/2012   Your procedure is scheduled on:  Tues, Dec 17 @ 7:30 AM  Report to [facility] at 5:30 AM.  Call this number if you have problems the morning of surgery: [phone]   Remember:   Do not eat food:After Midnight.    Take these medicines the morning of surgery with A SIP OF WATER: Cymbalta(Duloxetine)              Discontinue Aspirin and Diclofenac!!!   Do not wear jewelry, make-up or nail polish.  Do not wear lotions, powders, or perfumes. You may wear deodorant.  Do not shave 48 hours prior to surgery.   Do not bring valuables to the hospital.  Contacts, dentures or bridgework may not be worn into surgery.  Leave suitcase in the car. After surgery it may be brought to your room.  For patients admitted to the hospital, checkout time is 11:00 AM the day of discharge.   Patients discharged the day of surgery will not be allowed to drive home.    Special Instructions: Shower using CHG 2 nights before surgery and the night before surgery.  If you shower the day of surgery use CHG.  Use special wash - you have one bottle of CHG for all showers.  You should use approximately 1/3 of the bottle for each shower.   Please read over the following fact sheets that you were given: Pain Booklet, Coughing and Deep Breathing, Blood Transfusion Information, Total Joint Packet, MRSA Information and Surgical Site Infection Prevention

## 2012-03-16 MED ORDER — CEFAZOLIN SODIUM-DEXTROSE 2-3 GM-% IV SOLR
2.0000 g | INTRAVENOUS | Status: DC
  Filled 2012-03-16: qty 50

## 2012-03-16 MED ORDER — ACETAMINOPHEN 10 MG/ML IV SOLN
1000.0000 mg | Freq: Once | INTRAVENOUS | Status: AC
  Administered 2012-03-17: 1000 mg via INTRAVENOUS
  Filled 2012-03-16: qty 100

## 2012-03-16 MED ORDER — CHLORHEXIDINE GLUCONATE 4 % EX LIQD: 60.0000 mL | Freq: Every day | CUTANEOUS | Status: DC

## 2012-03-16 MED ORDER — CHLORHEXIDINE GLUCONATE 4 % EX LIQD: 60.0000 mL | Freq: Once | CUTANEOUS | Status: DC

## 2012-03-16 NOTE — H&P (Signed)
CHIEF COMPLAINT:  Painful right knee.   HISTORY OF PRESENT ILLNESS:  Wendy Reeves is a very pleasant 56 year old white married female who is seen today for evaluation of her right knee.  She has had surgical intervention on the right knee back on 06/27/2011 of which she had a tear in the medial meniscus and underwent a meniscectomy.  She also underwent a chondroplasty patellofemoral joint and medial femoral condyle.  She has areas of grade 3 changes with loose articular cartilage in the medial and lateral patellar facets.  She also had a significant synovitis.  The lateral compartment revealed grade 2 chondromalacia changes and no evidence of meniscal tear.  In the medial compartment there was some grade 4 chondromalacia particularly in the femoral condyle with areas of loose articular cartilage which were debrided.  She had diffuse grade 3 changes otherwise.  She had initially a little bit of a rocky postop course and had to have a knee aspiration performed on the 07/19/2011.  She was started on diclofenac in May when she was 6 weeks postop.  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  On 09/19/2011 she did have a corticosteroid injection and following that a viscosupplementation using Euflexxa was performed.  She still has a fair amount of trouble with the knee, has tried anti-inflammatories without much help.  It is interfering with her job as well as her activities of daily living and it is also starting to bother her even at nighttime.  Her symptoms are worsening to the point now where her bad days out number her good days.  Seen today for evaluation.     PAST MEDICAL HISTORY:   Past medical history and general health is good.     PAST SURGICAL HISTORY:   Surgeries include a C-section x2.  Laparoscopic cholecystectomy.  Right knee arthroscopy.  Left knee arthroscopy.  Hysterectomy.    CURRENT MEDICATIONS:   Include Cymbalta 60 mg 2 times a day.   Trazodone 200 mg nightly. Tramadol 50 mg p.r.n.     ALLERGIES:    Percocet which causes her to have crawling of the skin.  Penicillin and sulfa gives her hives.     REVIEW OF SYSTEMS:   A 14-point review of systems is positive for glasses and weight gain.   FAMILY HISTORY:   Positive for mother who remains alive at 59 years of age with hypertension and arthritis.  Father who died at the age of 70+ from prostate cancer.  She has 2 living brothers of 60 and 55 who are very healthy.  A sister who also is alive at 86 and healthy.   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  She denies use of tobacco.  She does occasionally drink alcohol.   PHYSICAL EXAMINATION:  Reveals a very pleasant 56 year old white female, well-developed, well-nourished, alert, pleasant, and cooperative in moderate distress secondary to right knee pain.  She is 5 feet 9 inches.  Weighs 179 pounds.  BMI is 26.4.     Vital signs reveal  a temperature of 98.3.  Pulse 76.  Respirations 16.  Blood pressure 143/83.     Head is normocephalic.   Eyes reveal pupils reactive to light and accommodation with extraocular movements intact.  Ears, nose and throat were benign.   Neck was supple and no bruits were noted. Chest had good expansion. Lungs were essentially clear. Cardiac had a regular  rhythm and rate, normal S1, S2, no murmurs noted.  Pulse is 2+ bilateral and symmetric. Abdomen:  Scaphoid, soft, nontender, normal bowel sounds present.   CNS:  Oriented x3 and cranial nerves II-XII grossly intact.   Musculoskeletal:  She has range of motion from 3-4 degrees to 115 degrees.  She has marked crepitus with range of motion of the knee.  She does have a 1+ effusion.  Pseudolaxity with varus and valgus stressing.  She has negative Lachman and drawer.  Pain to palpation over both joint lines.     RADIOGRAPHS:  X-rays today reviewed from 01/13/2012 reveals medial compartment sclerosis  with narrowing of the joint space.  There is periarticular spurring on the medial aspect.  She also has periarticular spurring laterally.  She also has some patellofemoral OA.  Possible loose body noted posteriorly x2.     CLINICAL IMPRESSION:    Significant OA of the right knee.   RECOMMENDATIONS:  At this time we feel that the fact that she has had conservative treatment and it has failed, that she is a candidate for a right total knee arthroplasty.  The procedure, risks and benefits were fully explained to her and a model was used to instruct her in what was being performed.  Oris Drone Aleda Grana [facility] [phone]  03/16/2012 8:32 AM

## 2012-03-17 ENCOUNTER — Encounter (HOSPITAL_COMMUNITY)
Admission: RE | Disposition: A | Discharge status: Home Health | Payer: Self-pay | Source: Ambulatory Visit | Attending: Orthopaedic Surgery

## 2012-03-17 ENCOUNTER — Encounter (HOSPITAL_COMMUNITY): Payer: Self-pay | Admitting: *Deleted

## 2012-03-17 ENCOUNTER — Inpatient Hospital Stay (HOSPITAL_COMMUNITY)
Admission: RE | Admit: 2012-03-17 | Discharge: 2012-03-19 | DRG: 470 | Disposition: A | Discharge status: Home Health | Payer: Managed Care, Other (non HMO) | Source: Ambulatory Visit | Attending: Orthopaedic Surgery | Admitting: Orthopaedic Surgery

## 2012-03-17 ENCOUNTER — Ambulatory Visit (HOSPITAL_COMMUNITY): Payer: Managed Care, Other (non HMO) | Admitting: Anesthesiology

## 2012-03-17 ENCOUNTER — Encounter (HOSPITAL_COMMUNITY): Payer: Self-pay | Admitting: Anesthesiology

## 2012-03-17 DIAGNOSIS — M179 Osteoarthritis of knee, unspecified: Secondary | ICD-10-CM

## 2012-03-17 DIAGNOSIS — F329 Major depressive disorder, single episode, unspecified: Secondary | ICD-10-CM | POA: Diagnosis present

## 2012-03-17 DIAGNOSIS — Z9089 Acquired absence of other organs: Secondary | ICD-10-CM

## 2012-03-17 DIAGNOSIS — Z882 Allergy status to sulfonamides status: Secondary | ICD-10-CM

## 2012-03-17 DIAGNOSIS — D62 Acute posthemorrhagic anemia: Secondary | ICD-10-CM | POA: Diagnosis not present

## 2012-03-17 DIAGNOSIS — M1710 Unilateral primary osteoarthritis, unspecified knee: Principal | ICD-10-CM | POA: Diagnosis present

## 2012-03-17 DIAGNOSIS — F3289 Other specified depressive episodes: Secondary | ICD-10-CM | POA: Diagnosis present

## 2012-03-17 DIAGNOSIS — Z90710 Acquired absence of both cervix and uterus: Secondary | ICD-10-CM

## 2012-03-17 DIAGNOSIS — Z880 Allergy status to penicillin: Secondary | ICD-10-CM

## 2012-03-17 HISTORY — PX: TOTAL KNEE ARTHROPLASTY: SHX125

## 2012-03-17 SURGERY — ARTHROPLASTY, KNEE, TOTAL
Anesthesia: General | Site: Knee | Laterality: Right | Wound class: Clean

## 2012-03-17 MED ORDER — ONDANSETRON HCL 4 MG/2ML IJ SOLN: 4.0000 mg | Freq: Once | INTRAMUSCULAR | Status: DC | PRN

## 2012-03-17 MED ORDER — HYDROMORPHONE HCL PF 1 MG/ML IJ SOLN
0.2500 mg | INTRAMUSCULAR | Status: DC | PRN
  Administered 2012-03-17 (×4): 0.5 mg via INTRAVENOUS

## 2012-03-17 MED ORDER — DOCUSATE SODIUM 100 MG PO CAPS
100.0000 mg | ORAL_CAPSULE | Freq: Two times a day (BID) | ORAL | Status: DC
  Administered 2012-03-17 – 2012-03-19 (×5): 100 mg via ORAL
  Filled 2012-03-17 (×6): qty 1

## 2012-03-17 MED ORDER — ARTIFICIAL TEARS OP OINT
TOPICAL_OINTMENT | OPHTHALMIC | Status: DC | PRN
  Administered 2012-03-17: 1 via OPHTHALMIC

## 2012-03-17 MED ORDER — KETOROLAC TROMETHAMINE 30 MG/ML IJ SOLN
INTRAMUSCULAR | Status: AC
  Administered 2012-03-17: 30 mg
  Filled 2012-03-17: qty 1

## 2012-03-17 MED ORDER — METHOCARBAMOL 500 MG PO TABS
500.0000 mg | ORAL_TABLET | Freq: Four times a day (QID) | ORAL | Status: DC | PRN
  Administered 2012-03-17 – 2012-03-19 (×7): 500 mg via ORAL
  Filled 2012-03-17 (×7): qty 1

## 2012-03-17 MED ORDER — RIVAROXABAN 10 MG PO TABS
10.0000 mg | ORAL_TABLET | Freq: Every day | ORAL | Status: DC
  Administered 2012-03-17 – 2012-03-19 (×3): 10 mg via ORAL
  Filled 2012-03-17 (×4): qty 1

## 2012-03-17 MED ORDER — VANCOMYCIN HCL IN DEXTROSE 1-5 GM/200ML-% IV SOLN
INTRAVENOUS | Status: AC
  Filled 2012-03-17: qty 200

## 2012-03-17 MED ORDER — SODIUM CHLORIDE 0.9 % IV SOLN
INTRAVENOUS | Status: DC
  Administered 2012-03-17: 16:00:00 via INTRAVENOUS

## 2012-03-17 MED ORDER — DIPHENHYDRAMINE HCL 50 MG/ML IJ SOLN
12.5000 mg | Freq: Once | INTRAMUSCULAR | Status: AC
  Administered 2012-03-17: 12.5 mg via INTRAVENOUS

## 2012-03-17 MED ORDER — BUPIVACAINE-EPINEPHRINE 0.25% -1:200000 IJ SOLN
INTRAMUSCULAR | Status: DC | PRN
  Administered 2012-03-17: 30 mL

## 2012-03-17 MED ORDER — KETOROLAC TROMETHAMINE 30 MG/ML IJ SOLN
30.0000 mg | Freq: Once | INTRAMUSCULAR | Status: AC
  Administered 2012-03-17: 30 mg via INTRAVENOUS

## 2012-03-17 MED ORDER — MENTHOL 3 MG MT LOZG: 1.0000 | LOZENGE | OROMUCOSAL | Status: DC | PRN

## 2012-03-17 MED ORDER — METOCLOPRAMIDE HCL 10 MG PO TABS: 5.0000 mg | ORAL_TABLET | Freq: Three times a day (TID) | ORAL | Status: DC | PRN

## 2012-03-17 MED ORDER — SODIUM CHLORIDE 0.9 % IV SOLN
INTRAVENOUS | Status: DC | PRN
  Administered 2012-03-17: 07:00:00 via INTRAVENOUS

## 2012-03-17 MED ORDER — METOCLOPRAMIDE HCL 5 MG/ML IJ SOLN: 5.0000 mg | Freq: Three times a day (TID) | INTRAMUSCULAR | Status: DC | PRN

## 2012-03-17 MED ORDER — KETOROLAC TROMETHAMINE 15 MG/ML IJ SOLN
15.0000 mg | Freq: Four times a day (QID) | INTRAMUSCULAR | Status: DC
  Filled 2012-03-17 (×3): qty 1

## 2012-03-17 MED ORDER — PROPOFOL 10 MG/ML IV BOLUS
INTRAVENOUS | Status: DC | PRN
  Administered 2012-03-17: 200 mg via INTRAVENOUS

## 2012-03-17 MED ORDER — DULOXETINE HCL 60 MG PO CPEP
120.0000 mg | ORAL_CAPSULE | Freq: Every day | ORAL | Status: DC
  Administered 2012-03-18 – 2012-03-19 (×2): 120 mg via ORAL
  Filled 2012-03-17 (×2): qty 2

## 2012-03-17 MED ORDER — HYDROMORPHONE HCL PF 1 MG/ML IJ SOLN
0.5000 mg | INTRAMUSCULAR | Status: DC | PRN
  Administered 2012-03-17 – 2012-03-18 (×2): 0.5 mg via INTRAVENOUS
  Filled 2012-03-17 (×2): qty 1

## 2012-03-17 MED ORDER — BISACODYL 10 MG RE SUPP: 10.0000 mg | Freq: Every day | RECTAL | Status: DC | PRN

## 2012-03-17 MED ORDER — BUPIVACAINE-EPINEPHRINE PF 0.25-1:200000 % IJ SOLN
INTRAMUSCULAR | Status: AC
  Filled 2012-03-17: qty 30

## 2012-03-17 MED ORDER — CYCLOSPORINE 0.05 % OP EMUL
1.0000 [drp] | Freq: Two times a day (BID) | OPHTHALMIC | Status: DC
  Administered 2012-03-17 – 2012-03-18 (×2): 1 [drp] via OPHTHALMIC
  Administered 2012-03-18: 23:00:00 via OPHTHALMIC
  Administered 2012-03-19: 1 [drp] via OPHTHALMIC
  Filled 2012-03-17 (×8): qty 1

## 2012-03-17 MED ORDER — SODIUM CHLORIDE 0.9 % IV SOLN: INTRAVENOUS | Status: DC

## 2012-03-17 MED ORDER — ACETAMINOPHEN 10 MG/ML IV SOLN
1000.0000 mg | Freq: Four times a day (QID) | INTRAVENOUS | Status: AC
  Administered 2012-03-17 – 2012-03-18 (×4): 1000 mg via INTRAVENOUS
  Filled 2012-03-17 (×5): qty 100

## 2012-03-17 MED ORDER — MIDAZOLAM HCL 5 MG/5ML IJ SOLN
INTRAMUSCULAR | Status: DC | PRN
  Administered 2012-03-17: 2 mg via INTRAVENOUS

## 2012-03-17 MED ORDER — HYDROMORPHONE HCL PF 1 MG/ML IJ SOLN
INTRAMUSCULAR | Status: AC
  Filled 2012-03-17: qty 2

## 2012-03-17 MED ORDER — DIPHENHYDRAMINE HCL 50 MG/ML IJ SOLN
INTRAMUSCULAR | Status: AC
  Filled 2012-03-17: qty 1

## 2012-03-17 MED ORDER — ONDANSETRON HCL 4 MG/2ML IJ SOLN: 4.0000 mg | Freq: Four times a day (QID) | INTRAMUSCULAR | Status: DC | PRN

## 2012-03-17 MED ORDER — ACETAMINOPHEN 10 MG/ML IV SOLN: 1000.0000 mg | Freq: Once | INTRAVENOUS | Status: DC | PRN

## 2012-03-17 MED ORDER — PHENOL 1.4 % MT LIQD: 1.0000 | OROMUCOSAL | Status: DC | PRN

## 2012-03-17 MED ORDER — ALUM & MAG HYDROXIDE-SIMETH 200-200-20 MG/5ML PO SUSP: 30.0000 mL | ORAL | Status: DC | PRN

## 2012-03-17 MED ORDER — ONDANSETRON HCL 4 MG/2ML IJ SOLN
INTRAMUSCULAR | Status: DC | PRN
  Administered 2012-03-17: 4 mg via INTRAVENOUS

## 2012-03-17 MED ORDER — SODIUM CHLORIDE 0.9 % IR SOLN
Status: DC | PRN
  Administered 2012-03-17: 3000 mL
  Administered 2012-03-17: 1000 mL

## 2012-03-17 MED ORDER — ONDANSETRON HCL 4 MG PO TABS: 4.0000 mg | ORAL_TABLET | Freq: Four times a day (QID) | ORAL | Status: DC | PRN

## 2012-03-17 MED ORDER — FLEET ENEMA 7-19 GM/118ML RE ENEM: 1.0000 | ENEMA | Freq: Once | RECTAL | Status: AC | PRN

## 2012-03-17 MED ORDER — TRAZODONE HCL 100 MG PO TABS
200.0000 mg | ORAL_TABLET | Freq: Every day | ORAL | Status: DC
  Administered 2012-03-17 – 2012-03-18 (×2): 200 mg via ORAL
  Filled 2012-03-17 (×4): qty 2

## 2012-03-17 MED ORDER — KETOROLAC TROMETHAMINE 15 MG/ML IJ SOLN
15.0000 mg | Freq: Four times a day (QID) | INTRAMUSCULAR | Status: DC
  Filled 2012-03-17 (×4): qty 1

## 2012-03-17 MED ORDER — VANCOMYCIN HCL IN DEXTROSE 1-5 GM/200ML-% IV SOLN
1000.0000 mg | Freq: Two times a day (BID) | INTRAVENOUS | Status: AC
  Administered 2012-03-17: 1000 mg via INTRAVENOUS
  Filled 2012-03-17: qty 200

## 2012-03-17 MED ORDER — MAGNESIUM HYDROXIDE 400 MG/5ML PO SUSP: 30.0000 mL | Freq: Every day | ORAL | Status: DC | PRN

## 2012-03-17 MED ORDER — VANCOMYCIN HCL 1000 MG IV SOLR
1000.0000 mg | INTRAVENOUS | Status: DC | PRN
  Administered 2012-03-17: 1000 mg via INTRAVENOUS

## 2012-03-17 MED ORDER — THROMBIN 20000 UNITS EX KIT
PACK | CUTANEOUS | Status: AC
  Filled 2012-03-17: qty 1

## 2012-03-17 MED ORDER — OXYCODONE HCL 5 MG PO TABS
5.0000 mg | ORAL_TABLET | ORAL | Status: DC | PRN
  Administered 2012-03-18 – 2012-03-19 (×8): 10 mg via ORAL
  Filled 2012-03-17 (×8): qty 2

## 2012-03-17 MED ORDER — LACTATED RINGERS IV SOLN
INTRAVENOUS | Status: DC | PRN
  Administered 2012-03-17 (×2): via INTRAVENOUS

## 2012-03-17 MED ORDER — TRAZODONE HCL 100 MG PO TABS
200.0000 mg | ORAL_TABLET | Freq: Every day | ORAL | Status: DC
  Filled 2012-03-17: qty 3

## 2012-03-17 MED ORDER — METHOCARBAMOL 100 MG/ML IJ SOLN: 500.0000 mg | Freq: Four times a day (QID) | INTRAVENOUS | Status: DC | PRN

## 2012-03-17 MED ORDER — FENTANYL CITRATE 0.05 MG/ML IJ SOLN
INTRAMUSCULAR | Status: DC | PRN
  Administered 2012-03-17 (×2): 50 ug via INTRAVENOUS
  Administered 2012-03-17: 100 ug via INTRAVENOUS
  Administered 2012-03-17: 50 ug via INTRAVENOUS
  Administered 2012-03-17: 25 ug via INTRAVENOUS
  Administered 2012-03-17: 50 ug via INTRAVENOUS
  Administered 2012-03-17: 25 ug via INTRAVENOUS

## 2012-03-17 MED ORDER — THROMBIN 20000 UNITS EX SOLR
CUTANEOUS | Status: AC
  Filled 2012-03-17: qty 20000

## 2012-03-17 MED ORDER — THROMBIN 20000 UNITS EX KIT
PACK | CUTANEOUS | Status: DC | PRN
  Administered 2012-03-17: 20000 [IU] via TOPICAL

## 2012-03-17 SURGICAL SUPPLY — 63 items
BANDAGE ESMARK 6X9 LF (GAUZE/BANDAGES/DRESSINGS) ×1 IMPLANT
BLADE SAGITTAL 25.0X1.19X90 (BLADE) ×2 IMPLANT
BNDG CMPR 9X6 STRL LF SNTH (GAUZE/BANDAGES/DRESSINGS) ×1
BNDG ESMARK 6X9 LF (GAUZE/BANDAGES/DRESSINGS) ×2
BOWL SMART MIX CTS (DISPOSABLE) ×2 IMPLANT
CEMENT HV SMART SET (Cement) ×4 IMPLANT
CLOTH BEACON ORANGE TIMEOUT ST (SAFETY) ×2 IMPLANT
COVER BACK TABLE 24X17X13 BIG (DRAPES) ×1 IMPLANT
COVER SURGICAL LIGHT HANDLE (MISCELLANEOUS) ×2 IMPLANT
CUFF TOURNIQUET SINGLE 34IN LL (TOURNIQUET CUFF) ×1 IMPLANT
CUFF TOURNIQUET SINGLE 44IN (TOURNIQUET CUFF) IMPLANT
DRAPE EXTREMITY T 121X128X90 (DRAPE) ×2 IMPLANT
DRAPE ORTHO SPLIT 77X108 STRL (DRAPES) ×2
DRAPE PROXIMA HALF (DRAPES) ×2 IMPLANT
DRAPE SURG ORHT 6 SPLT 77X108 (DRAPES) IMPLANT
DRSG ADAPTIC 3X8 NADH LF (GAUZE/BANDAGES/DRESSINGS) ×2 IMPLANT
DRSG PAD ABDOMINAL 8X10 ST (GAUZE/BANDAGES/DRESSINGS) ×4 IMPLANT
DURAPREP 26ML APPLICATOR (WOUND CARE) ×2 IMPLANT
ELECT CAUTERY BLADE 6.4 (BLADE) ×2 IMPLANT
ELECT REM PT RETURN 9FT ADLT (ELECTROSURGICAL) ×2
ELECTRODE REM PT RTRN 9FT ADLT (ELECTROSURGICAL) ×1 IMPLANT
EVACUATOR 1/8 PVC DRAIN (DRAIN) ×1 IMPLANT
FACESHIELD LNG OPTICON STERILE (SAFETY) ×4 IMPLANT
FLOSEAL 10ML (HEMOSTASIS) IMPLANT
GLOVE BIOGEL PI IND STRL 7.0 (GLOVE) IMPLANT
GLOVE BIOGEL PI IND STRL 8 (GLOVE) ×1 IMPLANT
GLOVE BIOGEL PI IND STRL 8.5 (GLOVE) ×1 IMPLANT
GLOVE BIOGEL PI INDICATOR 7.0 (GLOVE) ×1
GLOVE BIOGEL PI INDICATOR 8 (GLOVE) ×1
GLOVE BIOGEL PI INDICATOR 8.5 (GLOVE) ×1
GLOVE ECLIPSE 8.0 STRL XLNG CF (GLOVE) ×5 IMPLANT
GLOVE SURG ORTHO 8.5 STRL (GLOVE) ×4 IMPLANT
GLOVE SURG SS PI 6.5 STRL IVOR (GLOVE) ×1 IMPLANT
GOWN PREVENTION PLUS XXLARGE (GOWN DISPOSABLE) ×2 IMPLANT
GOWN STRL NON-REIN LRG LVL3 (GOWN DISPOSABLE) ×4 IMPLANT
HANDPIECE INTERPULSE COAX TIP (DISPOSABLE) ×2
KIT BASIN OR (CUSTOM PROCEDURE TRAY) ×2 IMPLANT
KIT ROOM TURNOVER OR (KITS) ×2 IMPLANT
MANIFOLD NEPTUNE II (INSTRUMENTS) ×2 IMPLANT
NEEDLE 22X1 1/2 (OR ONLY) (NEEDLE) ×1 IMPLANT
NS IRRIG 1000ML POUR BTL (IV SOLUTION) ×2 IMPLANT
PACK TOTAL JOINT (CUSTOM PROCEDURE TRAY) ×2 IMPLANT
PAD ARMBOARD 7.5X6 YLW CONV (MISCELLANEOUS) ×4 IMPLANT
PAD CAST 4YDX4 CTTN HI CHSV (CAST SUPPLIES) ×1 IMPLANT
PADDING CAST COTTON 4X4 STRL (CAST SUPPLIES) ×2
PADDING CAST COTTON 6X4 STRL (CAST SUPPLIES) ×2 IMPLANT
SET HNDPC FAN SPRY TIP SCT (DISPOSABLE) ×1 IMPLANT
SPONGE GAUZE 4X4 12PLY (GAUZE/BANDAGES/DRESSINGS) ×2 IMPLANT
STAPLER VISISTAT 35W (STAPLE) ×2 IMPLANT
SUCTION FRAZIER TIP 10 FR DISP (SUCTIONS) ×2 IMPLANT
SUT BONE WAX W31G (SUTURE) ×2 IMPLANT
SUT ETHIBOND NAB CT1 #1 30IN (SUTURE) ×5 IMPLANT
SUT MNCRL AB 3-0 PS2 18 (SUTURE) ×2 IMPLANT
SUT VIC AB 0 CT1 27 (SUTURE) ×2
SUT VIC AB 0 CT1 27XBRD ANBCTR (SUTURE) ×1 IMPLANT
SUT VIC AB 1 CT1 27 (SUTURE) ×2
SUT VIC AB 1 CT1 27XBRD ANBCTR (SUTURE) ×1 IMPLANT
SYR CONTROL 10ML LL (SYRINGE) ×1 IMPLANT
TOWEL OR 17X24 6PK STRL BLUE (TOWEL DISPOSABLE) ×2 IMPLANT
TOWEL OR 17X26 10 PK STRL BLUE (TOWEL DISPOSABLE) ×2 IMPLANT
TRAY FOLEY CATH 14FR (SET/KITS/TRAYS/PACK) ×1 IMPLANT
WATER STERILE IRR 1000ML POUR (IV SOLUTION) ×5 IMPLANT
WRAP KNEE MAXI GEL POST OP (GAUZE/BANDAGES/DRESSINGS) ×2 IMPLANT

## 2012-03-17 NOTE — Anesthesia Preprocedure Evaluation (Addendum)
Anesthesia Evaluation  Patient identified by MRN, date of birth, ID band Patient awake    Reviewed: Allergy & Precautions, H&P , NPO status , Patient's Chart, lab work & pertinent test results  Airway Mallampati: II      Dental  (+) Teeth Intact and Dental Advisory Given   Pulmonary neg pulmonary ROS,  breath sounds clear to auscultation        Cardiovascular negative cardio ROS  Rhythm:Regular Rate:Normal     Neuro/Psych PSYCHIATRIC DISORDERS Depression negative neurological ROS     GI/Hepatic hiatal hernia, Patient received Oral Contrast Agents,  Endo/Other  negative endocrine ROS  Renal/GU negative Renal ROS     Musculoskeletal negative musculoskeletal ROS (+)   Abdominal   Peds  Hematology negative hematology ROS (+)   Anesthesia Other Findings   Reproductive/Obstetrics negative OB ROS                          Anesthesia Physical Anesthesia Plan  ASA: II  Anesthesia Plan: General   Post-op Pain Management:    Induction: Intravenous  Airway Management Planned: Oral ETT  Additional Equipment:   Intra-op Plan:   Post-operative Plan: Extubation in OR  Informed Consent: I have reviewed the patients History and Physical, chart, labs and discussed the procedure including the risks, benefits and alternatives for the proposed anesthesia with the patient or authorized representative who has indicated his/her understanding and acceptance.   Dental advisory given  Plan Discussed with: Anesthesiologist and Surgeon  Anesthesia Plan Comments:         Anesthesia Quick Evaluation

## 2012-03-17 NOTE — Evaluation (Signed)
Physical Therapy Evaluation Patient Details Name: Wendy Reeves MRN: [id number] DOB: September 14, 1955 Today's Date: 03/17/2012 Time: 1356-1430 PT Time Calculation (min): 34 min  PT Assessment / Plan / Recommendation Clinical Impression  pt rpesents with R TKA.  pt moving well and very motivated.  pt indicates R LE still feels numb, but able to functionally use during mobility.      PT Assessment  Patient needs continued PT services    Follow Up Recommendations  Home health PT;Supervision/Assistance - 24 hour    Does the patient have the potential to tolerate intense rehabilitation      Barriers to Discharge None      Equipment Recommendations  None recommended by PT    Recommendations for Other Services OT consult   Frequency 7X/week    Precautions / Restrictions Precautions Precautions: Fall Restrictions Weight Bearing Restrictions: Yes RLE Weight Bearing: Partial weight bearing RLE Partial Weight Bearing Percentage or Pounds: 50   Pertinent Vitals/Pain Indicates no pain, as R LE is still numb.      Mobility  Bed Mobility Bed Mobility: Supine to Sit;Sitting - Scoot to Edge of Bed Supine to Sit: 4: Min assist Sitting - Scoot to Edge of Bed: 4: Min assist Details for Bed Mobility Assistance: A to support R LE only.   Transfers Transfers: Sit to Stand;Stand to Sit Sit to Stand: 4: Min assist;With upper extremity assist;From bed Stand to Sit: 4: Min assist;With upper extremity assist;To chair/3-in-1;With armrests Details for Transfer Assistance: cues for UE use, positioning of LEs, controlling descent to chair Ambulation/Gait Ambulation/Gait Assistance: 4: Min guard Ambulation Distance (Feet): 60 Feet Assistive device: Rolling walker Ambulation/Gait Assistance Details: cues for use of RW, gait sequencing, positioning in RW.   Gait Pattern: Step-to pattern;Decreased step length - left;Decreased stance time - right Stairs: No Wheelchair Mobility Wheelchair Mobility:  No    Shoulder Instructions     Exercises Total Joint Exercises Ankle Circles/Pumps: AROM;Both;10 reps Quad Sets: AROM;Both;10 reps Long Arc Quad: AAROM;Right;10 reps Knee Flexion: AAROM;Right;10 reps   PT Diagnosis: Abnormality of gait  PT Problem List: Decreased strength;Decreased range of motion;Decreased activity tolerance;Decreased balance;Decreased mobility;Decreased knowledge of use of DME PT Treatment Interventions: DME instruction;Gait training;Stair training;Functional mobility training;Therapeutic activities;Therapeutic exercise;Balance training;Patient/family education   PT Goals Acute Rehab PT Goals PT Goal Formulation: With patient Time For Goal Achievement: 03/24/12 Potential to Achieve Goals: Good Pt will go Supine/Side to Sit: with modified independence PT Goal: Supine/Side to Sit - Progress: Goal set today Pt will go Sit to Supine/Side: with modified independence PT Goal: Sit to Supine/Side - Progress: Goal set today Pt will go Sit to Stand: with modified independence PT Goal: Sit to Stand - Progress: Goal set today Pt will go Stand to Sit: with modified independence PT Goal: Stand to Sit - Progress: Goal set today Pt will Ambulate: >150 feet;with modified independence;with rolling walker PT Goal: Ambulate - Progress: Goal set today Pt will Go Up / Down Stairs: 1-2 stairs;with supervision;with rolling walker PT Goal: Up/Down Stairs - Progress: Goal set today Pt will Perform Home Exercise Program: with supervision, verbal cues required/provided PT Goal: Perform Home Exercise Program - Progress: Goal set today  Visit Information  Last PT Received On: 03/17/12 Assistance Needed: +1    Subjective Data  Subjective: It still feels so numby.   Patient Stated Goal: Home   Prior Functioning  Home Living Lives With: Spouse Available Help at Discharge: Family;Available 24 hours/day Type of Home: House Home Access: Stairs to enter  Entrance Stairs-Number of Steps:  1 Entrance Stairs-Rails: None Home Layout: One level Bathroom Shower/Tub: Engineer, manufacturing systems: Standard Bathroom Accessibility: Yes How Accessible: Accessible via walker Home Adaptive Equipment: Bedside commode/3-in-1;Tub transfer bench;Walker - rolling Prior Function Level of Independence: Independent Able to Take Stairs?: Yes Driving: Yes Communication Communication: No difficulties    Cognition  Overall Cognitive Status: Appears within functional limits for tasks assessed/performed Arousal/Alertness: Awake/alert Orientation Level: Appears intact for tasks assessed Behavior During Session: [facility] for tasks performed    Extremity/Trunk Assessment Right Lower Extremity Assessment RLE ROM/Strength/Tone: Deficits RLE ROM/Strength/Tone Deficits: pt's R LE still numb and pt with minimal active movement at this time.   Left Lower Extremity Assessment LLE ROM/Strength/Tone: WFL for tasks assessed LLE Sensation: WFL - Light Touch Trunk Assessment Trunk Assessment: Normal   Balance Balance Balance Assessed: No  End of Session PT - End of Session Equipment Utilized During Treatment: Gait belt Activity Tolerance: Patient tolerated treatment well Patient left: in chair;with call bell/phone within reach Nurse Communication: Mobility status CPM Right Knee CPM Right Knee: Off Right Knee Flexion (Degrees): 60  Right Knee Extension (Degrees): 0   GP     RitenourAlison Murray, [place] [phone] 03/17/2012, 2:56 PM

## 2012-03-17 NOTE — Op Note (Signed)
Wendy Reeves, STIEBER NO.:  [id number]  MEDICAL RECORD NO.:  [id number]  LOCATION:  5N09C                        FACILITY:  [facility]  PHYSICIAN:  Claude Manges. Khadeja Abt, M.D.DATE OF BIRTH:  [birth year]  DATE OF PROCEDURE:  03/17/2012 DATE OF DISCHARGE:                              OPERATIVE REPORT   PREOPERATIVE DIAGNOSIS:  End-stage osteoarthritis, right knee.  POSTOPERATIVE DIAGNOSIS:  End-stage osteoarthritis, right knee.  PROCEDURE:  Right total knee replacement.  SURGEON:  Claude Manges. Cleophas Dunker, M.D.  ASSISTANT:  Arlys John D. Petrarca, P.A.-C., who was present throughout the operative procedure to ensure its timely completion.  ANESTHESIA:  Femoral nerve block with general.  COMPLICATIONS:  None.  COMPONENTS:  DePuy LCS standard plus femoral component.  A #4 keeled tibial tray.  A #10 polyethylene bridging bearing, a metal-back 3-peg rotating patella.  All components were secured with polymethyl methacrylate.  PROCEDURE:  Ms. Felty was met in the holding area, identified the right knee as appropriate operative site and marked it with a pen.  Anesthesia performed a right femoral nerve block.  The patient was then transported to room #7 and placed under general anesthesia without difficulty.  Nursing staff inserted a Foley catheter, urine was clear.  Tourniquet was applied to the right thigh.  The right leg was then prepped with Betadine scrub and then DuraPrep x2 from the thigh tourniquet to the tip of the toes.  Sterile draping was performed.  Extremity was still elevated, it was Esmarch, exsanguinated with a proximal tourniquet at 350 mmHg.  A midline longitudinal incision was made, centered about the patella extending from the superior pouch to tibial tubercle.  Via sharp dissection, incision was carried down to the subcutaneous tissue.  The superficial capsule was incised in the midline and medial parapatellar incision was made with the Bovie.  The  joint was then entered.  There was a clear yellow joint effusion about 10 mL of volume.  Patella was everted at 180 degrees and knee flexed to 90 degrees.  There were large osteophytes along the medial and lateral femoral condyles.  Complete absence of articular cartilage along the medial femur and tibia and a moderate amount of synovitis.  Synovectomy was performed.  Osteophytes removed.  I sized a standard plus femoral component.  First, bony cut was then made transversely in the proximal tibia with a 7-degree angle of declination using the external tibial guide.  After the cut, I used an external guide to check the accuracy of the cut. Subsequent cuts were then made on the femur using the standard plus tibial femoral jig.  Laminar spreaders were inserted to remove medial and lateral menisci as well as ACL and PCL.  There was a Baker cyst posteriorly that I partially debrided and used 3/4-inch curved osteotome to remove the osteophytes in the posterior, medial, and lateral femoral condyles.  MCL and LCL remained intact.  Final cut was then made on the femur for the finishing guide to taper the bone.  I did use a 4-degree distal femoral valgus cut and flexion and extension gaps were perfectly symmetrical at 10 mm.  Retractors were then placed around the tibia.  The #4 tibial  jig was then applied.  The center cut was made followed by the keeled cut.  With the tibial jig in place, the 10-mm bridging bearing was then inserted followed by the trial standard plus femoral component.  The entire construct was reduced and through a full range of motion, we had full extension and flexion, now opening with varus or valgus stress, and negative anterior drawer sign, and there was no spinning or malrotation of the tibial tray.  Patella was prepared by removing 10 mm of bone leaving approximately 12.5 mm of patella thickness.  Three holes were then made.  The trial patella inserted and reduced and  through full range of motion, it remained stable.  Trial components were removed.  The joint was copiously irrigated with saline solution.  Final components were then impacted with polymethyl methacrylate.  We initially inserted the tibia followed by the 10-mm bridging bearing and the standard plus femoral component.  The joint was then reduced.  We had excellent fit of the components.  Extraneous methacrylate was removed for free air.  Patella was applied with the same methacrylate.  While waiting for the methacrylate to mature, the joint was irrigated with saline solution.  We injected 0.25% Marcaine with epinephrine.  At approximately 16 minutes, the methacrylate had matured, the tourniquet was deflated at 66 minutes.  Gross bleeders were Bovie coagulated.  We again irrigated the wound and then applied thrombin spray.  The joint was nice and dry.  A medium-size Hemovac was then inserted through the lateral capsule.  The joint was then closed in its anatomic layers.  The deep capsule was closed with interrupted #1 Ethibond.  Superficial capsule was closed with running 0 Vicryl, subcu with 3-0 Monocryl.  Skin was closed with skin clips.  Sterile bulky dressing was applied followed by the patient's support stocking.  The patient tolerated the procedure well without complications.     Claude Manges. Cleophas Dunker, M.D.     PWW/MEDQ  D:  03/17/2012  T:  03/17/2012  Job:  [id number]

## 2012-03-17 NOTE — Progress Notes (Signed)
Completed an old order of 30mg /ml of Toradol. Order was for 15mg /mL of Toradol to be given at 1645. Gave 30mg /ml of Toradol at 1549 on 03/17/12. Did not give the 15mg /ml dose of medication. Jacqualine Code PA of Dr. Cleophas Dunker notified and he revised the order.  Will resume new order and schedule for this medication. No adverse reactions observed. Will continue to monitor patient.

## 2012-03-17 NOTE — Op Note (Signed)
PATIENT ID:      Wendy Reeves  MRN:     [id number] DOB/AGE:    1955/11/18 / 56 y.o.       OPERATIVE REPORT    DATE OF PROCEDURE:  03/17/2012       PREOPERATIVE DIAGNOSIS: END STAGE OSTEOARTHRITIS RIGHT KNEE                                                       There is no height or weight on file to calculate BMI.     POSTOPERATIVE DIAGNOSIS:   SAME                                                                  There is no height or weight on file to calculate BMI.     PROCEDURE:  Procedure(s):RIGHT TOTAL KNEE ARTHROPLASTY     SURGEON:  [name], MD    ASSISTANT:   Jacqualine Code, PA-C   (Present and scrubbed throughout the case, critical for assistance with exposure, retraction, instrumentation, and closure.)          ANESTHESIA: regional and general     DRAINS: (RIGHT KNEE) Hemovact drain(s) in the OPEN with  Suction Open :      TOURNIQUET TIME:  Total Tourniquet Time Documented: Thigh (Right) - 66 minutes    COMPLICATIONS:  None   CONDITION:  stablePROCEDURE IN [id number]     [name], [name] 03/17/2012, 9:32 AM

## 2012-03-17 NOTE — Anesthesia Postprocedure Evaluation (Signed)
  Anesthesia Post-op Note  Patient: Wendy Reeves  Procedure(s) Performed: Procedure(s) (LRB) with comments: TOTAL KNEE ARTHROPLASTY (Right) - RIGHT TOTAL ARTHROPLASTY  Patient Location: PACU  Anesthesia Type:General and GA combined with regional for post-op pain  Level of Consciousness: awake, alert  and oriented  Airway and Oxygen Therapy: Patient Spontanous Breathing and Patient connected to nasal cannula oxygen  Post-op Pain: mild  Post-op Assessment: Post-op Vital signs reviewed, Patient's Cardiovascular Status Stable, Respiratory Function Stable, Patent Airway and No signs of Nausea or vomiting  Post-op Vital Signs: stable  Complications: No apparent anesthesia complications

## 2012-03-17 NOTE — Progress Notes (Signed)
Patient ID: Wendy Reeves, female   DOB: Jun 03, 1955, 56 y.o.   MRN: [id number] The recent History & Physical has been reviewed. I have personally examined the patient today. There is no interval change to the documented History & Physical. The patient would like to proceed with the procedure.  [name] W 03/17/2012,  7:11 AM

## 2012-03-17 NOTE — Anesthesia Procedure Notes (Signed)
Procedure Name: LMA Insertion Date/Time: 03/17/2012 7:41 AM Performed by: Neomia Dear, Dymond Spreen K Pre-anesthesia Checklist: Patient identified, Timeout performed, Emergency Drugs available, Suction available and Patient being monitored Patient Re-evaluated:Patient Re-evaluated prior to inductionOxygen Delivery Method: Circle system utilized Preoxygenation: Pre-oxygenation with 100% oxygen Intubation Type: IV induction Ventilation: Mask ventilation without difficulty LMA: LMA inserted LMA Size: 4.0 Number of attempts: 1 Placement Confirmation: positive ETCO2 and breath sounds checked- equal and bilateral Tube secured with: Tape Dental Injury: Teeth and Oropharynx as per pre-operative assessment

## 2012-03-17 NOTE — Transfer of Care (Signed)
Immediate Anesthesia Transfer of Care Note  Patient: Wendy Reeves  Procedure(s) Performed: Procedure(s) (LRB) with comments: TOTAL KNEE ARTHROPLASTY (Right) - RIGHT TOTAL ARTHROPLASTY  Patient Location: PACU  Anesthesia Type:GA combined with regional for post-op pain  Level of Consciousness: awake, alert  and oriented  Airway & Oxygen Therapy: Patient Spontanous Breathing and Patient connected to nasal cannula oxygen  Post-op Assessment: Report given to PACU RN, Post -op Vital signs reviewed and stable and Patient moving all extremities  Post vital signs: Reviewed and stable  Complications: No apparent anesthesia complications

## 2012-03-17 NOTE — Preoperative (Addendum)
Beta Blockers   Reason not to administer Beta Blockers:Not Applicable 

## 2012-03-17 NOTE — Progress Notes (Signed)
Orthopedic Tech Progress Note Patient Details:  Wendy Reeves [birth year] [id number] CPM applied to Right knee with appropriate settings. OHF applied to bed CPM Right Knee CPM Right Knee: On Right Knee Flexion (Degrees): 60  Right Knee Extension (Degrees): 0    Asia R Thompson 03/17/2012, 11:16 AM

## 2012-03-18 ENCOUNTER — Encounter (HOSPITAL_COMMUNITY): Payer: Self-pay | Admitting: Orthopaedic Surgery

## 2012-03-18 ENCOUNTER — Other Ambulatory Visit: Payer: Self-pay

## 2012-03-18 LAB — CBC
Hemoglobin: 9.9 g/dL — ABNORMAL LOW (ref 12.0–15.0)
MCHC: 32.5 g/dL (ref 30.0–36.0)
Platelets: 187 10*3/uL (ref 150–400)
RBC: 3.41 MIL/uL — ABNORMAL LOW (ref 3.87–5.11)

## 2012-03-18 LAB — BASIC METABOLIC PANEL
Calcium: 8.3 mg/dL — ABNORMAL LOW (ref 8.4–10.5)
GFR calc non Af Amer: >90 mL/min (ref >90)
Glucose, Bld: 96 mg/dL (ref 70–99)
Potassium: 3.8 mEq/L (ref 3.5–5.1)
Sodium: 140 mEq/L (ref 135–145)

## 2012-03-18 NOTE — Progress Notes (Signed)
Physical Therapy Treatment Patient Details Name: Wendy Reeves MRN: [id number] DOB: 06-30-1955 Today's Date: 03/18/2012 Time: 7829-5621 PT Time Calculation (min): 25 min  PT Assessment / Plan / Recommendation Comments on Treatment Session  Patient continuing to progress. Husband educated on stairs    Follow Up Recommendations  Home health PT;Supervision/Assistance - 24 hour     Does the patient have the potential to tolerate intense rehabilitation     Barriers to Discharge        Equipment Recommendations  None recommended by PT    Recommendations for Other Services    Frequency 7X/week   Plan Discharge plan remains appropriate;Frequency remains appropriate    Precautions / Restrictions Restrictions RLE Weight Bearing: Partial weight bearing RLE Partial Weight Bearing Percentage or Pounds: 50   Pertinent Vitals/Pain     Mobility  Bed Mobility Bed Mobility: Sit to Supine Supine to Sit: 6: Modified independent (Device/Increase time) Sitting - Scoot to Edge of Bed: 6: Modified independent (Device/Increase time) Sit to Supine: 6: Modified independent (Device/Increase time) Transfers Sit to Stand: 6: Modified independent (Device/Increase time) Stand to Sit: 6: Modified independent (Device/Increase time) Ambulation/Gait Ambulation/Gait Assistance: 5: Supervision Ambulation Distance (Feet): 250 Feet Assistive device: Rolling walker Stairs Assistance: 4: Min assist Stairs Assistance Details (indicate cue type and reason): A from husband for RW. Patient with correct technique Stair Management Technique: Step to pattern;Backwards;With walker Number of Stairs: 4     Exercises     PT Diagnosis:    PT Problem List:   PT Treatment Interventions:     PT Goals Acute Rehab PT Goals PT Goal: Supine/Side to Sit - Progress: Met PT Goal: Sit to Supine/Side - Progress: Met PT Goal: Sit to Stand - Progress: Met PT Goal: Stand to Sit - Progress: Met PT Goal: Ambulate -  Progress: Progressing toward goal PT Goal: Up/Down Stairs - Progress: Progressing toward goal  Visit Information  Last PT Received On: 03/18/12 Assistance Needed: +1    Subjective Data      Cognition  Overall Cognitive Status: Appears within functional limits for tasks assessed/performed Arousal/Alertness: Awake/alert Orientation Level: Appears intact for tasks assessed Behavior During Session: [facility] for tasks performed    Balance     End of Session PT - End of Session Equipment Utilized During Treatment: Gait belt Activity Tolerance: Patient tolerated treatment well Patient left: in chair;with call bell/phone within reach CPM Right Knee Right Knee Flexion (Degrees): 60  Right Knee Extension (Degrees): 0    GP     Robinette, Adline Potter 03/18/2012, 1:50 PM 03/18/2012 Fredrich Birks PTA [phone] pager [phone] office

## 2012-03-18 NOTE — Progress Notes (Signed)
Physical Therapy Treatment Patient Details Name: Wendy Reeves MRN: [id number] DOB: 1955-10-26 Today's Date: 03/18/2012 Time: 4098-1191 PT Time Calculation (min): 24 min  PT Assessment / Plan / Recommendation Comments on Treatment Session  Patient progressing well. Will attempt stairs this afternoon again for education to husband.     Follow Up Recommendations  Home health PT;Supervision/Assistance - 24 hour     Does the patient have the potential to tolerate intense rehabilitation     Barriers to Discharge        Equipment Recommendations  None recommended by PT    Recommendations for Other Services    Frequency 7X/week   Plan Discharge plan remains appropriate;Frequency remains appropriate    Precautions / Restrictions Precautions Precautions: Fall Restrictions RLE Weight Bearing: Partial weight bearing RLE Partial Weight Bearing Percentage or Pounds: 50   Pertinent Vitals/Pain     Mobility  Bed Mobility Supine to Sit: 6: Modified independent (Device/Increase time);HOB flat Sitting - Scoot to Edge of Bed: 6: Modified independent (Device/Increase time) Details for Bed Mobility Assistance: educated on using sheet or belt as leg lifter for RT LE crossing off the bed into the floor Transfers Sit to Stand: 5: Supervision;With upper extremity assist;From chair/3-in-1 Stand to Sit: 5: Supervision;To chair/3-in-1;With upper extremity assist Details for Transfer Assistance: adequate level for home Ambulation/Gait Ambulation/Gait Assistance: 5: Supervision Ambulation Distance (Feet): 125 Feet Assistive device: Rolling walker Ambulation/Gait Assistance Details: Cues for posture and to look forward Gait Pattern: Step-to pattern Stairs: Yes Stairs Assistance: 4: Min assist Stairs Assistance Details (indicate cue type and reason): Cues for technique and sequence Stair Management Technique: No rails;Backwards;With walker;Step to pattern    Exercises Total Joint  Exercises Quad Sets: AROM;Both;15 reps Heel Slides: Right;15 reps;AAROM Hip ABduction/ADduction: AAROM;Right;15 reps Straight Leg Raises: AAROM;Right;15 reps   PT Diagnosis:    PT Problem List:   PT Treatment Interventions:     PT Goals Acute Rehab PT Goals PT Goal: Sit to Stand - Progress: Progressing toward goal PT Goal: Stand to Sit - Progress: Progressing toward goal PT Goal: Ambulate - Progress: Progressing toward goal PT Goal: Up/Down Stairs - Progress: Progressing toward goal PT Goal: Perform Home Exercise Program - Progress: Progressing toward goal  Visit Information  Last PT Received On: 03/18/12 Assistance Needed: +1    Subjective Data      Cognition  Overall Cognitive Status: Appears within functional limits for tasks assessed/performed Arousal/Alertness: Awake/alert Orientation Level: Appears intact for tasks assessed Behavior During Session: [facility] for tasks performed    Balance     End of Session PT - End of Session Equipment Utilized During Treatment: Gait belt Activity Tolerance: Patient tolerated treatment well Patient left: in chair;with call bell/phone within reach Nurse Communication: Mobility status   GP     Fredrich Birks 03/18/2012, 9:43 AM 03/18/2012 Fredrich Birks PTA [phone] pager [phone] office

## 2012-03-18 NOTE — Progress Notes (Signed)
UR COMPLETED  

## 2012-03-18 NOTE — Progress Notes (Addendum)
Patient ID: Wendy Reeves, female   DOB: 26-Mar-1956, 56 y.o.   MRN: [id number] PATIENT ID: Wendy Reeves        MRN:  [id number]          DOB/AGE: [birth year] / 56 y.o.    Wendy Campbell, MD   Jacqualine Code, PA-C [address] [place], [place]  [address]                             [phone]   PROGRESS NOTE  Subjective:  negative for Chest Pain  negative for Shortness of Breath  negative for Nausea/Vomiting   positive for Calf Pain cramping posterior knee    Tolerating Diet: yes         Patient reports pain as mild and moderate.     Walked last night without difficulty  Objective: Vital signs in last 24 hours:   Patient Vitals for the past 24 hrs:  BP Temp Pulse Resp SpO2  03/18/12 0600 90/45 mmHg 98.9 F (37.2 C) 74  18  99 %  03/18/12 0200 96/55 mmHg 98.4 F (36.9 C) 77  18  99 %  03/17/12 2159 91/52 mmHg 98.3 F (36.8 C) 72  18  97 %  03/17/12 1741 110/49 mmHg 97.9 F (36.6 C) 76  16  99 %  03/17/12 1353 99/51 mmHg 97.7 F (36.5 C) 73  16  96 %  03/17/12 1138 115/55 mmHg 97.7 F (36.5 C) 77  16  99 %  03/17/12 1115 - - 74  12  100 %  03/17/12 1100 104/55 mmHg - 73  10  100 %  03/17/12 1045 118/70 mmHg - - 10  100 %  03/17/12 1030 119/77 mmHg - 82  11  98 %  03/17/12 1015 126/66 mmHg - 76  11  98 %  03/17/12 1000 146/66 mmHg - 81  13  98 %  03/17/12 0945 145/74 mmHg - 76  12  95 %  03/17/12 0938 135/67 mmHg 97.2 F (36.2 C) 80  - 94 %      Intake/Output from previous day:   12/17 0701 - 12/18 0700 In: 2150 [P.O.:600; I.V.:1550] Out: 3125 [Urine:2475; Drains:550]   Intake/Output this shift:       Intake/Output      12/17 0701 - 12/18 0700 12/18 0701 - 12/19 0700   P.O. 600    I.V. 1550    Total Intake 2150    Urine 2475    Drains 550    Blood 100    Total Output 3125    Net -975            LABORATORY DATA:  Basename 03/18/12 0550 03/11/12 1628  WBC 6.1 6.7  HGB 9.9* 13.6  HCT 30.5* 41.4  PLT 187 246    Basename 03/18/12 0550  03/11/12 1628  NA 140 139  K 3.8 3.5  CL 104 102  CO2 31 30  BUN 6 12  CREATININE 0.61 0.59  GLUCOSE 96 150*  CALCIUM 8.3* 9.3   Lab Results  Component Value Date   INR 1.01 03/11/2012    Recent Radiographic Studies :   Chest 2 View  03/11/2012  *RADIOLOGY REPORT*  Clinical Data: Preop for right knee replacement  CHEST - 2 VIEW  Comparison: Chest x-ray of 08/04/2007  Findings: No active infiltrate or effusion is seen.  Mediastinal contours are stable.  The heart is borderline enlarged and  stable. No bony abnormality is seen.  IMPRESSION: No active lung disease.  Stable borderline cardiomegaly.   Original Report Authenticated By: Dwyane Dee, M.D.    Mm Digital Screening  02/26/2012  *RADIOLOGY REPORT*  Clinical Data: Screening.  DIGITAL BILATERAL SCREENING MAMMOGRAM WITH CAD DIGITAL BREAST TOMOSYNTHESIS  Digital breast tomosynthesis images are acquired in two projections.  These images are reviewed in combination with the digital mammogram, confirming the findings below.  Comparison:  Previous exams.  Findings: The breast tissue is extremely dense. No suspicious masses, architectural distortion, or calcifications are present.  Images were processed with CAD.  IMPRESSION: No mammographic evidence of malignancy.  A result letter of this screening mammogram will be mailed directly to the patient.  RECOMMENDATION: Screening mammogram in one year. (Code:SM-B-01Y)  BI-RADS CATEGORY 1:  Negative.   Original Report Authenticated By: Christiana Pellant, M.D.      Examination:  General appearance: alert, cooperative, mild distress and moderate distress Resp: clear to auscultation bilaterally Cardio: regular rate and rhythm GI: normal findings: bowel sounds normal  Wound Exam: clean, dry, intact   Drainage:  None: wound tissue dry  Motor Exam: EHL, FHL, Anterior Tibial and Posterior Tibial Intact  Sensory Exam: Superficial Peroneal, Deep Peroneal and Tibial normal  Vascular Exam: Right  posterior tibial artery has 1+ (weak) pulse  Drainage less than 50 ml at 6 AM according to nursing.  Checked at 8:00 AM 90 ml.  Assessment:    1 Day Post-Op  Procedure(s) (LRB): TOTAL KNEE ARTHROPLASTY (Right)  ADDITIONAL DIAGNOSIS:  Principal Problem:  *Osteoarthritis of knee  Acute Blood Loss Anemia asymptomatic at this time   Plan: Physical Therapy as ordered Partial Weight Bearing @ 50% (PWB)  DVT Prophylaxis:  Xarelto, Foot Pumps and TED hose  DISCHARGE PLAN: Home  DISCHARGE NEEDS: HHPT, CPM, Walker and 3-in-1 comode seat  Will keep hemovac in till tomorrow since 90 ml emptied at 8 AM          [facility] 03/18/2012 8:05 AM

## 2012-03-18 NOTE — Evaluation (Signed)
Occupational Therapy Evaluation Patient Details Name: Wendy Reeves MRN: [id number] DOB: 11-23-1955 Today's Date: 03/18/2012 Time: 4098-1191 OT Time Calculation (min): 28 min  OT Assessment / Plan / Recommendation Clinical Impression  56 yo female s/p Rt TKA that does not require acute OT. NO further OT needs.    OT Assessment  Patient does not need any further OT services    Follow Up Recommendations  No OT follow up    Barriers to Discharge      Equipment Recommendations  None recommended by OT    Recommendations for Other Services    Frequency       Precautions / Restrictions Precautions Precautions: Fall Restrictions RLE Weight Bearing: Partial weight bearing RLE Partial Weight Bearing Percentage or Pounds: 50   Pertinent Vitals/Pain 7 out 10 due to pain at calf    ADL  Eating/Feeding: Independent Where Assessed - Eating/Feeding: Chair Grooming: Wash/dry hands;Wash/dry face;Teeth care;Modified independent Where Assessed - Grooming: Unsupported standing Lower Body Dressing: Supervision/safety (pt is able to reach toes) Where Assessed - Lower Body Dressing: Unsupported sit to stand Toilet Transfer: Supervision/safety Toilet Transfer Method: Sit to stand Toilet Transfer Equipment: Raised toilet seat with arms (or 3-in-1 over toilet) Tub/Shower Transfer: Supervision/safety Tub/Shower Transfer Method: Science writer: Transfer tub bench (using a belt as leg lifter) Equipment Used: Gait belt;Rolling walker Transfers/Ambulation Related to ADLs: pt educated on PWB using RW. pt verbalized not being PWB this AM when completing toilet transfer. Pt demonstrates ability to maintain PWB. Pt completed x2 turns unsafely on one leg during session. Pt educated on fall risk and demonstrated self correction ADL Comments: pt has completed all education and at adequate level for d/c    OT Diagnosis:    OT Problem List:   OT Treatment Interventions:     OT  Goals    Visit Information  Last OT Received On: 03/18/12 Assistance Needed: +1    Subjective Data  Subjective: "i am going to stay at my mothers house she is 24 yo but my husband can help" Patient Stated Goal: to go home   Prior Functioning     Home Living Lives With: Spouse Available Help at Discharge: Family;Available 24 hours/day Type of Home: House Home Access: Stairs to enter [employer] of Steps: 1 Entrance Stairs-Rails: None Home Layout: One level Bathroom Shower/Tub: Engineer, manufacturing systems: Standard Bathroom Accessibility: Yes How Accessible: Accessible via walker Home Adaptive Equipment: Bedside commode/3-in-1;Tub transfer bench;Walker - rolling Prior Function Level of Independence: Independent Able to Take Stairs?: Yes Driving: Yes Communication Communication: No difficulties Dominant Hand: Right         Vision/Perception     Cognition  Overall Cognitive Status: Appears within functional limits for tasks assessed/performed Arousal/Alertness: Awake/alert Orientation Level: Appears intact for tasks assessed Behavior During Session: [facility] for tasks performed    Extremity/Trunk Assessment Right Upper Extremity Assessment RUE ROM/Strength/Tone: Within functional levels Left Upper Extremity Assessment LUE ROM/Strength/Tone: Within functional levels     Mobility Bed Mobility Supine to Sit: 6: Modified independent (Device/Increase time);HOB flat Sitting - Scoot to Edge of Bed: 6: Modified independent (Device/Increase time) Details for Bed Mobility Assistance: educated on using sheet or belt as leg lifter for RT LE crossing off the bed into the floor Transfers Transfers: Sit to Stand;Stand to Sit Sit to Stand: 5: Supervision;With upper extremity assist;From bed Stand to Sit: 5: Supervision;With upper extremity assist;To chair/3-in-1 Details for Transfer Assistance: adequate level for home     Shoulder Instructions  Exercise      Balance     End of Session OT - End of Session Activity Tolerance: Patient tolerated treatment well Patient left: Other (comment) (with PTA Raynelle Fanning in gym s/p tub transfer) Nurse Communication: Mobility status;Precautions  GO     Lucile Shutters 03/18/2012, 9:26 AM Pager: [phone]

## 2012-03-19 LAB — BASIC METABOLIC PANEL
CO2: 31 mEq/L (ref 19–32)
Chloride: 102 mEq/L (ref 96–112)
Glucose, Bld: 104 mg/dL — ABNORMAL HIGH (ref 70–99)
Potassium: 3.6 mEq/L (ref 3.5–5.1)
Sodium: 138 mEq/L (ref 135–145)

## 2012-03-19 LAB — CBC
Hemoglobin: 9.4 g/dL — ABNORMAL LOW (ref 12.0–15.0)
MCH: 29.1 pg (ref 26.0–34.0)
Platelets: 199 10*3/uL (ref 150–400)
RBC: 3.23 MIL/uL — ABNORMAL LOW (ref 3.87–5.11)
WBC: 6.8 10*3/uL (ref 4.0–10.5)

## 2012-03-19 MED ORDER — RIVAROXABAN 10 MG PO TABS: 10.0000 mg | ORAL_TABLET | Freq: Every day | ORAL | Status: DC

## 2012-03-19 MED ORDER — OXYCODONE HCL 5 MG PO TABS: 5.0000 mg | ORAL_TABLET | ORAL | Status: DC | PRN

## 2012-03-19 MED ORDER — METHOCARBAMOL 500 MG PO TABS: 500.0000 mg | ORAL_TABLET | Freq: Four times a day (QID) | ORAL | Status: DC | PRN

## 2012-03-19 NOTE — Progress Notes (Addendum)
Patient ID: Wendy Reeves, female   DOB: Aug 14, 1955, 56 y.o.   MRN: [id number] PATIENT ID: Wendy Reeves        MRN:  [id number]          DOB/AGE: 16-May-1955 / 56 y.o.    [name], MD   Jacqualine Code, PA-C [address] [place], [place]  [address]                             [phone]   PROGRESS NOTE  Subjective:  negative for Chest Pain  negative for Shortness of Breath  negative for Nausea/Vomiting   positive for Calf Pain popliteal area but improving    Tolerating Diet: yes         Patient reports pain as mild and moderate.     Improving and ambulating well.  Wants to go home.  Objective: Vital signs in last 24 hours:   Patient Vitals for the past 24 hrs:  BP Temp Temp src Pulse Resp SpO2  03/19/12 0555 140/62 mmHg 99.1 F (37.3 C) Oral 87  18  95 %  03/18/12 2055 147/64 mmHg 98.9 F (37.2 C) Oral 87  18  94 %      Intake/Output from previous day:   12/18 0701 - 12/19 0700 In: 600 [P.O.:600] Out: 315 [Urine:240; Drains:75]   Intake/Output this shift:   12/19 0701 - 12/19 1900 In: 480 [P.O.:480] Out: -    Intake/Output      12/18 0701 - 12/19 0700 12/19 0701 - 12/20 0700   P.O. 600 480   I.V.     Total Intake 600 480   Urine 240    Drains 75    Blood     Total Output 315    Net +285 +480        Urine Occurrence 6 x 2 x      LABORATORY DATA:  Basename 03/19/12 0550 03/18/12 0550  WBC 6.8 6.1  HGB 9.4* 9.9*  HCT 28.6* 30.5*  PLT 199 187    Basename 03/19/12 0550 03/18/12 0550  NA 138 140  K 3.6 3.8  CL 102 104  CO2 31 31  BUN 3* 6  CREATININE 0.51 0.61  GLUCOSE 104* 96  CALCIUM 8.5 8.3*   Lab Results  Component Value Date   INR 1.01 03/11/2012    Recent Radiographic Studies :   Chest 2 View  03/11/2012  *RADIOLOGY REPORT*  Clinical Data: Preop for right knee replacement  CHEST - 2 VIEW  Comparison: Chest x-ray of 08/04/2007  Findings: No active infiltrate or effusion is seen.  Mediastinal contours are stable.  The heart is  borderline enlarged and stable. No bony abnormality is seen.  IMPRESSION: No active lung disease.  Stable borderline cardiomegaly.   Original Report Authenticated By: Dwyane Dee, M.D.    Mm Digital Screening  02/26/2012  *RADIOLOGY REPORT*  Clinical Data: Screening.  DIGITAL BILATERAL SCREENING MAMMOGRAM WITH CAD DIGITAL BREAST TOMOSYNTHESIS  Digital breast tomosynthesis images are acquired in two projections.  These images are reviewed in combination with the digital mammogram, confirming the findings below.  Comparison:  Previous exams.  Findings: The breast tissue is extremely dense. No suspicious masses, architectural distortion, or calcifications are present.  Images were processed with CAD.  IMPRESSION: No mammographic evidence of malignancy.  A result letter of this screening mammogram will be mailed directly to the patient.  RECOMMENDATION: Screening mammogram in one  year. (Code:SM-B-01Y)  BI-RADS CATEGORY 1:  Negative.   Original Report Authenticated By: Christiana Pellant, M.D.    Dg Outside Films Extremity  03/18/2012  This examination belongs to an outside facility and is stored  here for comparison purposes only.  Contact the originating outside  institution for any associated report or interpretation.     Examination:  General appearance: alert, cooperative and mild distress Resp: clear to auscultation bilaterally Cardio: regular rate and rhythm GI: normal findings: bowel sounds normal  Calf much less tender. Pain much better  Wound Exam: clean, dry, intact   Drainage:  Scant/small amount dried blood  Motor Exam: EHL, FHL, Anterior Tibial and Posterior Tibial Intact  Sensory Exam: Superficial Peroneal, Deep Peroneal and Tibial normal  Vascular Exam: Left posterior tibial artery has 1+ (weak) pulse  Assessment:    2 Days Post-Op  Procedure(s) (LRB): TOTAL KNEE ARTHROPLASTY (Right)  ADDITIONAL DIAGNOSIS:  Principal Problem:  *Osteoarthritis of knee  Acute Blood Loss  Anemia asymptomatic   Plan: Physical Therapy as ordered Partial Weight Bearing @ 50% (PWB)  DVT Prophylaxis:  Xarelto  DISCHARGE PLAN: Home today  DISCHARGE NEEDS: HHPT, CPM, Walker and 3-in-1 comode seat         [facility] 03/19/2012 4:32 PM

## 2012-03-19 NOTE — Progress Notes (Signed)
Physical Therapy Treatment Patient Details Name: ADELLYN CAPEK MRN: [id number] DOB: 1955/07/08 Today's Date: 03/19/2012 Time: 0901-0940 PT Time Calculation (min): 39 min  PT Assessment / Plan / Recommendation Comments on Treatment Session       Follow Up Recommendations  Home health PT;Supervision/Assistance - 24 hour     Does the patient have the potential to tolerate intense rehabilitation     Barriers to Discharge        Equipment Recommendations  None recommended by PT    Recommendations for Other Services    Frequency 7X/week   Plan Discharge plan remains appropriate;Frequency remains appropriate    Precautions / Restrictions Restrictions RLE Weight Bearing: Partial weight bearing RLE Partial Weight Bearing Percentage or Pounds: 50   Pertinent Vitals/Pain     Mobility  Bed Mobility Supine to Sit: 6: Modified independent (Device/Increase time) Sitting - Scoot to Edge of Bed: 6: Modified independent (Device/Increase time) Sit to Supine: 6: Modified independent (Device/Increase time) Transfers Sit to Stand: 6: Modified independent (Device/Increase time) Stand to Sit: 6: Modified independent (Device/Increase time) Ambulation/Gait Ambulation/Gait Assistance: 6: Modified independent (Device/Increase time) Ambulation Distance (Feet): 400 Feet Assistive device: Rolling walker Gait Pattern: Step-through pattern Gait velocity: decreased    Exercises Total Joint Exercises Quad Sets: AROM;Right;15 reps Short Arc QuadBarbaraann Boys;Right;15 reps Heel Slides: AAROM;Right;15 reps Hip ABduction/ADduction: Right;15 reps;AROM Straight Leg Raises: AAROM;Right;15 reps   PT Diagnosis:    PT Problem List:   PT Treatment Interventions:     PT Goals Acute Rehab PT Goals PT Goal: Supine/Side to Sit - Progress: Met PT Goal: Sit to Supine/Side - Progress: Met PT Goal: Sit to Stand - Progress: Met PT Goal: Stand to Sit - Progress: Met PT Goal: Ambulate - Progress: Met PT Goal:  Perform Home Exercise Program - Progress: Progressing toward goal  Visit Information  Last PT Received On: 03/19/12 Assistance Needed: +1    Subjective Data      Cognition  Overall Cognitive Status: Appears within functional limits for tasks assessed/performed Arousal/Alertness: Awake/alert Orientation Level: Appears intact for tasks assessed Behavior During Session: [facility] for tasks performed    Balance     End of Session PT - End of Session Equipment Utilized During Treatment: Gait belt Activity Tolerance: Patient tolerated treatment well Patient left: in chair;with call bell/phone within reach Nurse Communication: Mobility status   GP     Fredrich Birks 03/19/2012, 12:58 PM 03/19/2012 Fredrich Birks PTA [phone] pager [phone] office

## 2012-03-19 NOTE — Discharge Summary (Signed)
[name], MD   Jacqualine Code, PA-C [address] [place], [place]  [address]                             [phone]  PATIENT ID: Wendy Reeves        MRN:  [id number]          DOB/AGE: 1955/11/23 / 56 y.o.    DISCHARGE SUMMARY  ADMISSION DATE:    03/17/2012 DISCHARGE DATE:   03/19/2012   ADMISSION DIAGNOSIS: RT KNEE OSTEOARTHRITIS    DISCHARGE DIAGNOSIS:  Same  ADDITIONAL DIAGNOSIS: Principal Problem:  *Osteoarthritis of knee  Past Medical History  Diagnosis Date  . Depression   . H/O hiatal hernia     had surgery  . Arthritis   . History of blood transfusion     PROCEDURE: Procedure(s): TOTAL KNEE ARTHROPLASTY Right on 03/17/2012  CONSULTS: none     HISTORY: Wendy Reeves is a very pleasant 56 year old white married female who is seen today for evaluation of her right knee. She has had surgical intervention on the right knee back on 06/27/2011 of which she had a tear in the medial meniscus and underwent a meniscectomy. She also underwent a chondroplasty patellofemoral joint and medial femoral condyle. She has areas of grade 3 changes with loose articular cartilage in the medial and lateral patellar facets. She also had a significant synovitis. The lateral compartment revealed grade 2 chondromalacia changes and no evidence of meniscal tear. In the medial compartment there was some grade 4 chondromalacia particularly in the femoral condyle with areas of loose articular cartilage which were debrided. She had diffuse grade 3 changes otherwise. She had initially a little bit of a rocky postop course and had to have a knee aspiration performed on the 07/19/2011. She was started on diclofenac in May when she was 6 weeks postop. 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 On 09/19/2011 she did have a corticosteroid injection and following that a viscosupplementation using Euflexxa was performed. She still has a fair  amount of trouble with the knee, has tried anti-inflammatories without much help. It is interfering with her job as well as her activities of daily living and it is also starting to bother her even at nighttime. Her symptoms are worsening to the point now where her bad days out number her good days   HOSPITAL COURSE:  [name] is a 56 y.o. admitted on 03/17/2012 and found to have a diagnosis of RT KNEE PAIN &  OSTEOARTHRITIS.  After appropriate laboratory studies were obtained  they were taken to the operating room on 03/17/2012 and underwent  Procedure(s): TOTAL KNEE ARTHROPLASTY Right.   They were given perioperative antibiotics:  Anti-infectives     Start     Dose/Rate Route Frequency Ordered Stop   03/17/12 1900   vancomycin (VANCOCIN) IVPB 1000 mg/200 mL premix        1,000 mg 200 mL/hr over 60 Minutes Intravenous Every 12 hours 03/17/12 1139 03/17/12 2226   03/16/12 1427   ceFAZolin (ANCEF) IVPB 2 g/50 mL premix  Status:  Discontinued        2 g 100 mL/hr over 30 Minutes Intravenous 60 min pre-op 03/16/12 1427 03/17/12 1123        .  Tolerated the procedure well.  Placed with a foley intraoperatively.  Given Ofirmev at induction and for 24 hours.  POD #1, allowed out of bed to a chair.  PT for ambulation and exercise program.  Foley D/C'd in morning.  IV saline locked.  O2 discontionued.  POD #2, continued PT and ambulation.  Hemovac pulled. Ambulating well.  Wanting to be discharged to home .  The remainder of the hospital course was dedicated to ambulation and strengthening.   The patient was discharged on 2 Days Post-Op in  Stable condition.  Blood products given:none  DIAGNOSTIC STUDIES: Recent vital signs: Patient Vitals for the past 24 hrs:  BP Temp Temp src Pulse Resp SpO2  03/19/12 0555 140/62 mmHg 99.1 F (37.3 C) Oral 87  18  95 %  03/18/12 2055 147/64 mmHg 98.9 F (37.2 C) Oral 87  18  94 %       Recent laboratory studies:  [facility] 03/19/12 0550  03/18/12 0550  WBC 6.8 6.1  HGB 9.4* 9.9*  HCT 28.6* 30.5*  PLT 199 187    Basename 03/19/12 0550 03/18/12 0550  NA 138 140  K 3.6 3.8  CL 102 104  CO2 31 31  BUN 3* 6  CREATININE 0.51 0.61  GLUCOSE 104* 96  CALCIUM 8.5 8.3*   Lab Results  Component Value Date   INR 1.01 03/11/2012     Recent Radiographic Studies :   Chest 2 View  03/11/2012  *RADIOLOGY REPORT*  Clinical Data: Preop for right knee replacement  CHEST - 2 VIEW  Comparison: Chest x-ray of 08/04/2007  Findings: No active infiltrate or effusion is seen.  Mediastinal contours are stable.  The heart is borderline enlarged and stable. No bony abnormality is seen.  IMPRESSION: No active lung disease.  Stable borderline cardiomegaly.   Original Report Authenticated By: Dwyane Dee, M.D.    Mm Digital Screening  02/26/2012  *RADIOLOGY REPORT*  Clinical Data: Screening.  DIGITAL BILATERAL SCREENING MAMMOGRAM WITH CAD DIGITAL BREAST TOMOSYNTHESIS  Digital breast tomosynthesis images are acquired in two projections.  These images are reviewed in combination with the digital mammogram, confirming the findings below.  Comparison:  Previous exams.  Findings: The breast tissue is extremely dense. No suspicious masses, architectural distortion, or calcifications are present.  Images were processed with CAD.  IMPRESSION: No mammographic evidence of malignancy.  A result letter of this screening mammogram will be mailed directly to the patient.  RECOMMENDATION: Screening mammogram in one year. (Code:SM-B-01Y)  BI-RADS CATEGORY 1:  Negative.   Original Report Authenticated By: Christiana Pellant, M.D.    Dg Outside Films Extremity  03/18/2012  This examination belongs to an outside facility and is stored  here for comparison purposes only.  Contact the originating outside  institution for any associated report or interpretation.    DISCHARGE INSTRUCTIONS: Discharge Orders    Future Orders Please Complete By Expires   Diet general       Call MD / Call 911      Comments:   If you experience chest pain or shortness of breath, CALL 911 and be transported to the hospital emergency room.  If you develope a fever above 101 F, pus (white drainage) or increased drainage or redness at the wound, or calf pain, call your surgeon's office.   Constipation Prevention      Comments:   Drink plenty of fluids.  Prune juice may be helpful.  You may use a stool softener, such as Colace (over the counter) 100 mg twice a day.  Use MiraLax (over the counter) for constipation as needed.   Increase  activity slowly as tolerated      Patient may shower      Comments:   You may shower today over the dressing.  You may shower without a dressing once there is no drainage.  Do not wash over the wound.  If drainage remains, cover wound with plastic wrap and then shower.   Partial weight bearing      Comments:   50 % WEIGHT BEARING AS TAUGHT IN PHYSICAL THERAPY   Driving restrictions      Comments:   No driving for 6 weeks   Lifting restrictions      Comments:   No lifting for 6 weeks   CPM      Comments:   Continuous passive motion machine (CPM):      Use the CPM from 0 to 60 for 6-8 hours per day.      You may increase by 5-10 per day.  You may break it up into 2 or 3 sessions per day.      Use CPM for 3-4 weeks or until you are told to stop.   TED hose      Comments:   Use stockings (TED hose) for 3 weeks on operative leg(s).  You may remove them at night for sleeping.   Change dressing      Comments:   Change dressing on Saturday, then change the dressing daily with sterile 4 x 4 inch gauze dressing and apply TED hose.  You may clean the incision with alcohol daily  prior to redressing.   Do not put a pillow under the knee. Place it under the heel.         DISCHARGE MEDICATIONS:     Medication List     As of 03/19/2012  4:43 PM    STOP taking these medications         aspirin EC 325 MG tablet      diclofenac 75 MG EC tablet    Commonly known as: VOLTAREN      TAKE these medications         cycloSPORINE 0.05 % ophthalmic emulsion   Commonly known as: RESTASIS   Place 1 drop into both eyes 2 (two) times daily.      DULoxetine 60 MG capsule   Commonly known as: CYMBALTA   Take 120 mg by mouth daily.      methocarbamol 500 MG tablet   Commonly known as: ROBAXIN   Take 1 tablet (500 mg total) by mouth every 6 (six) hours as needed.      multivitamins ther. w/minerals Tabs   Take 1 tablet by mouth daily.      oxyCODONE 5 MG immediate release tablet   Commonly known as: Oxy IR/ROXICODONE   Take 1-2 tablets (5-10 mg total) by mouth every 4 (four) hours as needed.      rivaroxaban 10 MG Tabs tablet   Commonly known as: XARELTO   Take 1 tablet (10 mg total) by mouth daily.      traZODone 100 MG tablet   Commonly known as: DESYREL   Take 200-300 mg by mouth at bedtime.        FOLLOW UP VISIT:       Follow-up Information    Follow up with Valeria Batman, MD. On 03/30/2012.   Contact information:   [address] [place] [address] [phone]          DISPOSITION:  home  CONDITION:  Stable  Mishael Krysiak  03/19/2012, 4:43 PM

## 2012-03-23 ENCOUNTER — Other Ambulatory Visit (HOSPITAL_COMMUNITY): Payer: Self-pay | Admitting: Orthopaedic Surgery

## 2012-03-23 ENCOUNTER — Ambulatory Visit (HOSPITAL_COMMUNITY)
Admission: RE | Admit: 2012-03-23 | Discharge: 2012-03-23 | Disposition: A | Discharge status: Home / Self Care | Payer: Managed Care, Other (non HMO) | Source: Ambulatory Visit | Attending: Orthopaedic Surgery | Admitting: Orthopaedic Surgery

## 2012-03-23 DIAGNOSIS — M7989 Other specified soft tissue disorders: Secondary | ICD-10-CM

## 2012-03-23 DIAGNOSIS — M79669 Pain in unspecified lower leg: Secondary | ICD-10-CM

## 2012-03-23 DIAGNOSIS — M79609 Pain in unspecified limb: Secondary | ICD-10-CM | POA: Insufficient documentation

## 2012-03-23 NOTE — Progress Notes (Signed)
VASCULAR LAB PRELIMINARY  PRELIMINARY  PRELIMINARY  PRELIMINARY  Right lower extremity venous duplex completed.    Preliminary report:  Right:  No evidence of DVT, superficial thrombosis, or Baker's cyst.  Lille Karim, RVT 03/23/2012, 3:19 PM

## 2012-07-20 ENCOUNTER — Encounter: Payer: Self-pay | Admitting: *Deleted

## 2012-07-21 ENCOUNTER — Ambulatory Visit (INDEPENDENT_AMBULATORY_CARE_PROVIDER_SITE_OTHER): Payer: Managed Care, Other (non HMO) | Admitting: Adult Health

## 2012-07-21 ENCOUNTER — Encounter: Payer: Self-pay | Admitting: Adult Health

## 2012-07-21 VITALS — BP 122/78 | Ht 66.0 in | Wt 187.0 lb

## 2012-07-21 DIAGNOSIS — K219 Gastro-esophageal reflux disease without esophagitis: Secondary | ICD-10-CM

## 2012-07-21 DIAGNOSIS — IMO0001 Reserved for inherently not codable concepts without codable children: Secondary | ICD-10-CM | POA: Insufficient documentation

## 2012-07-21 DIAGNOSIS — Z1212 Encounter for screening for malignant neoplasm of rectum: Secondary | ICD-10-CM

## 2012-07-21 DIAGNOSIS — R32 Unspecified urinary incontinence: Secondary | ICD-10-CM

## 2012-07-21 DIAGNOSIS — R319 Hematuria, unspecified: Secondary | ICD-10-CM

## 2012-07-21 HISTORY — DX: Unspecified urinary incontinence: R32

## 2012-07-21 LAB — POCT URINALYSIS DIPSTICK
Glucose, UA: NEGATIVE
Leukocytes, UA: NEGATIVE
Nitrite, UA: NEGATIVE

## 2012-07-21 LAB — HEMOCCULT GUIAC POC 1CARD (OFFICE)

## 2012-07-21 MED ORDER — ESOMEPRAZOLE MAGNESIUM 40 MG PO PACK: 40.0000 mg | PACK | Freq: Every day | ORAL | Status: DC

## 2012-07-21 MED ORDER — MIRABEGRON ER 25 MG PO TB24: 25.0000 mg | ORAL_TABLET | Freq: Every day | ORAL | Status: DC

## 2012-07-21 NOTE — Progress Notes (Signed)
Subjective:     Patient ID: Wendy Reeves, female   DOB: [birth year], 57 y.o.   MRN: [id number]  HPI Cyanne is a 57 year old white female in complaining of urinary incontinence and burning in stomach after eating and some changes in bowel movements, they are yellow and loose at times.She has tried Psychologist, forensic without relief.  The incontinence seems to be more urge than stress, but occasional stress. She is working full time,some stress.  Review of SystemsComplaints + in HPI Reviewed past medical,surgical,social, and family history. Reviewed medications and allergies.    Objective:   Physical Exam Blood pressure 122/78, height 5\' 6"  (1.676 m), weight 187 lb (84.823 kg).   Urine: trace blood Skin warm and dry Abdomen was soft, no HSM, a little tender mid epi- gastric area. Pelvic: External genitalia normal in appearance for age,vagina is slightly atrophic,cervix and uterus are absent.On rectal exam she has good sphincter tone, no polyps or hemorrhoid, hemoccult was negative.  Assessment:    Urinary incontinence Hematuria Reflux      Plan:    Will try nexium 40 1 daily and bland diet, decrease diet mountain dew to 1 20 oz. Per day Given samples of myrbetriq 25 mg 1 daily Number of samples 4 Lot [id number]    Exp date 6/16   Try kegel exercises and voiding more freguently Urine sent for C&S Will follow up by phone

## 2012-07-21 NOTE — Patient Instructions (Addendum)
Try mybretriq,Try nexium and bland diet Call next week in follow up Urinary Incontinence Your doctor wants you to have this information about urinary incontinence. This is the inability to keep urine in your body until you decide to release it. CAUSES  Prostate gland enlargement is a common cause of urinary incontinence. But there are many different causes for losing urinary control. They include:  Medicines.  Infections.  Prostate problems.  Surgery.  Neurological diseases.  Emotional factors. DIAGNOSIS  Evaluating the cause of incontinence is important in choosing the best treatment. This may require:  An ultrasound exam.  Kidney and bladder X-rays.  Cystoscopy. This is an exam of the bladder using a narrow scope. TREATMENT  For incontinent patients, normal daily hygiene and using changing pads or adult diapers regularly will prevent offensive odors and skin damage from the moisture. Changing your medicines may help control incontinence. Your caregiver may prescribe some medicines to help you regain control. Avoid caffeine. It can over-stimulate the bladder. Use the bathroom regularly. Try about every 2 to 3 hours even if you do not feel the need. Take time to empty your bladder completely. After urinating, wait a minute. Then try to urinate again. External devices used to catch urine or an indwelling urine catheter (Foley catheter) may be needed as well. Some prostate gland problems require surgery to correct. Call your caregiver for more information. Document Released: 04/25/2004 Document Revised: 06/10/2011 Document Reviewed: 04/20/2008 [facility] Patient Information 2013 [place], [place].

## 2012-07-22 LAB — URINALYSIS
Bilirubin Urine: NEGATIVE
Nitrite: NEGATIVE
Protein, ur: NEGATIVE mg/dL
Specific Gravity, Urine: <1.005 — ABNORMAL LOW (ref 1.005–1.030)
Urobilinogen, UA: 0.2 mg/dL (ref 0.0–1.0)

## 2012-07-23 LAB — URINE CULTURE: Colony Count: 3000

## 2012-08-20 ENCOUNTER — Telehealth: Payer: Self-pay | Admitting: Adult Health

## 2012-08-20 NOTE — Telephone Encounter (Signed)
Pt doing better esp her bladder the myrbetriq seems to be working, stomach still not a 100% try taking nexium every day

## 2012-08-27 ENCOUNTER — Telehealth: Payer: Self-pay | Admitting: Adult Health

## 2012-08-28 MED ORDER — MIRABEGRON ER 25 MG PO TB24: 25.0000 mg | ORAL_TABLET | Freq: Every day | ORAL | Status: DC

## 2012-08-28 NOTE — Telephone Encounter (Signed)
Needs myrbetriq will rx

## 2012-09-09 ENCOUNTER — Other Ambulatory Visit: Payer: Self-pay | Admitting: Orthopaedic Surgery

## 2012-09-09 DIAGNOSIS — Z780 Asymptomatic menopausal state: Secondary | ICD-10-CM

## 2013-03-12 ENCOUNTER — Other Ambulatory Visit: Payer: Self-pay

## 2013-03-12 DIAGNOSIS — Z1231 Encounter for screening mammogram for malignant neoplasm of breast: Secondary | ICD-10-CM

## 2013-03-24 ENCOUNTER — Ambulatory Visit
Admission: RE | Admit: 2013-03-24 | Discharge: 2013-03-24 | Disposition: A | Discharge status: Home / Self Care | Payer: BC Managed Care – PPO | Source: Ambulatory Visit

## 2013-03-24 DIAGNOSIS — Z1231 Encounter for screening mammogram for malignant neoplasm of breast: Secondary | ICD-10-CM

## 2013-05-20 ENCOUNTER — Telehealth: Payer: Self-pay | Admitting: Adult Health

## 2013-05-20 NOTE — Telephone Encounter (Signed)
Called complains of gas and stools are smaller, last colonoscopy about 9 years ago, see Dr Luvenia Starchourk's office number given, to make appt

## 2013-12-26 ENCOUNTER — Other Ambulatory Visit: Payer: Self-pay | Admitting: Adult Health

## 2014-01-31 ENCOUNTER — Encounter: Payer: Self-pay | Admitting: Adult Health

## 2014-03-21 ENCOUNTER — Telehealth: Payer: Self-pay | Admitting: *Deleted

## 2014-03-21 NOTE — Telephone Encounter (Signed)
Wendy Reeves[place] [facility] [place], states pt insurance will not cover the Nexium 40 mg powder but will cover the Nexium generic 40 mg tablet.

## 2014-03-21 NOTE — Telephone Encounter (Signed)
[facility] aware packet is approved til 03/19/15

## 2014-04-05 ENCOUNTER — Other Ambulatory Visit: Payer: Self-pay

## 2014-04-05 DIAGNOSIS — Z1231 Encounter for screening mammogram for malignant neoplasm of breast: Secondary | ICD-10-CM

## 2014-04-15 ENCOUNTER — Ambulatory Visit
Admission: RE | Admit: 2014-04-15 | Discharge: 2014-04-15 | Disposition: A | Discharge status: Home / Self Care | Payer: BC Managed Care – PPO | Source: Ambulatory Visit

## 2014-04-15 DIAGNOSIS — Z1231 Encounter for screening mammogram for malignant neoplasm of breast: Secondary | ICD-10-CM

## 2014-07-05 ENCOUNTER — Encounter (HOSPITAL_COMMUNITY): Payer: Self-pay

## 2014-07-05 ENCOUNTER — Other Ambulatory Visit (HOSPITAL_COMMUNITY): Payer: Self-pay | Admitting: *Deleted

## 2014-07-05 ENCOUNTER — Ambulatory Visit (HOSPITAL_COMMUNITY)
Admission: RE | Admit: 2014-07-05 | Discharge: 2014-07-05 | Disposition: A | Discharge status: Home / Self Care | Payer: BC Managed Care – PPO | Source: Ambulatory Visit | Attending: Orthopedic Surgery | Admitting: Orthopedic Surgery

## 2014-07-05 ENCOUNTER — Encounter (HOSPITAL_COMMUNITY)
Admission: RE | Admit: 2014-07-05 | Discharge: 2014-07-05 | Disposition: A | Discharge status: Home / Self Care | Payer: BC Managed Care – PPO | Source: Ambulatory Visit | Attending: Orthopaedic Surgery | Admitting: Orthopaedic Surgery

## 2014-07-05 DIAGNOSIS — Z01812 Encounter for preprocedural laboratory examination: Secondary | ICD-10-CM | POA: Diagnosis not present

## 2014-07-05 DIAGNOSIS — M25569 Pain in unspecified knee: Secondary | ICD-10-CM | POA: Diagnosis not present

## 2014-07-05 DIAGNOSIS — M179 Osteoarthritis of knee, unspecified: Secondary | ICD-10-CM | POA: Insufficient documentation

## 2014-07-05 DIAGNOSIS — Z01818 Encounter for other preprocedural examination: Secondary | ICD-10-CM | POA: Diagnosis not present

## 2014-07-05 DIAGNOSIS — M25669 Stiffness of unspecified knee, not elsewhere classified: Secondary | ICD-10-CM | POA: Insufficient documentation

## 2014-07-05 LAB — URINALYSIS, ROUTINE W REFLEX MICROSCOPIC
BILIRUBIN URINE: NEGATIVE
GLUCOSE, UA: NEGATIVE mg/dL
Ketones, ur: NEGATIVE mg/dL
Leukocytes, UA: NEGATIVE
Nitrite: NEGATIVE
Protein, ur: NEGATIVE mg/dL
SPECIFIC GRAVITY, URINE: 1.012 (ref 1.005–1.030)
UROBILINOGEN UA: 0.2 mg/dL (ref 0.0–1.0)
pH: 5.5 (ref 5.0–8.0)

## 2014-07-05 LAB — TYPE AND SCREEN
ABO/RH(D): A POS
ANTIBODY SCREEN: NEGATIVE

## 2014-07-05 LAB — COMPREHENSIVE METABOLIC PANEL
ALT: 19 U/L (ref 0–35)
AST: 19 U/L (ref 0–37)
Albumin: 3.9 g/dL (ref 3.5–5.2)
Alkaline Phosphatase: 66 U/L (ref 39–117)
Anion gap: 6 (ref 5–15)
BUN: 9 mg/dL (ref 6–23)
CO2: 30 mmol/L (ref 19–32)
Calcium: 8.7 mg/dL (ref 8.4–10.5)
Chloride: 104 mmol/L (ref 96–112)
Creatinine, Ser: 0.65 mg/dL (ref 0.50–1.10)
GFR calc Af Amer: >90 mL/min (ref >90)
Glucose, Bld: 105 mg/dL — ABNORMAL HIGH (ref 70–99)
Potassium: 3.8 mmol/L (ref 3.5–5.1)
SODIUM: 140 mmol/L (ref 135–145)
Total Bilirubin: 0.4 mg/dL (ref 0.3–1.2)
Total Protein: 6.5 g/dL (ref 6.0–8.3)

## 2014-07-05 LAB — CBC WITH DIFFERENTIAL/PLATELET
BASOS ABS: 0 10*3/uL (ref 0.0–0.1)
BASOS PCT: 1 % (ref 0–1)
Eosinophils Absolute: 0.4 10*3/uL (ref 0.0–0.7)
Eosinophils Relative: 9 % — ABNORMAL HIGH (ref 0–5)
HCT: 41.6 % (ref 36.0–46.0)
HEMOGLOBIN: 13.3 g/dL (ref 12.0–15.0)
Lymphocytes Relative: 30 % (ref 12–46)
Lymphs Abs: 1.4 10*3/uL (ref 0.7–4.0)
MCH: 28.4 pg (ref 26.0–34.0)
MCHC: 32 g/dL (ref 30.0–36.0)
MCV: 88.7 fL (ref 78.0–100.0)
Monocytes Absolute: 0.4 10*3/uL (ref 0.1–1.0)
Monocytes Relative: 8 % (ref 3–12)
NEUTROS ABS: 2.5 10*3/uL (ref 1.7–7.7)
NEUTROS PCT: 52 % (ref 43–77)
Platelets: 240 10*3/uL (ref 150–400)
RBC: 4.69 MIL/uL (ref 3.87–5.11)
RDW: 12.9 % (ref 11.5–15.5)
WBC: 4.7 10*3/uL (ref 4.0–10.5)

## 2014-07-05 LAB — PROTIME-INR
INR: 0.97 (ref 0.00–1.49)
Prothrombin Time: 13 seconds (ref 11.6–15.2)

## 2014-07-05 LAB — URINE MICROSCOPIC-ADD ON

## 2014-07-05 LAB — APTT: aPTT: 28 seconds (ref 24–37)

## 2014-07-05 LAB — SURGICAL PCR SCREEN
MRSA, PCR: NEGATIVE
Staphylococcus aureus: NEGATIVE

## 2014-07-05 NOTE — Pre-Procedure Instructions (Signed)
Wendy Reeves  07/05/2014   Your procedure is scheduled on:  Tuesday, July 12, 2014 at 7:15 AM.   [facility] Office at 5:30 AM.   Call this number if you have problems the morning of surgery: [phone]               Any questions prior to day of surgery, please call [phone] between 8 & 4 PM.    Remember:   Do not eat food or drink liquids after midnight Monday, 07/11/14.   Take these medicines the morning of surgery with A SIP OF WATER: Duloxetine (Cymbalta)  Stop Vitamins as of today. Do not use Aspirin products and NSAIDS (Ibuprofen, Aleve, etc) prior to surgery.   Do not wear jewelry, make-up or nail polish.  Do not wear lotions, powders, or perfumes. You may wear deodorant.  Do not shave 48 hours prior to surgery.   Do not bring valuables to the hospital.  [facility] is not responsible                  for any belongings or valuables.               Contacts, dentures or bridgework may not be worn into surgery.  Leave suitcase in the car. After surgery it may be brought to your room.  For patients admitted to the hospital, discharge time is determined by your                treatment team.               Special Instructions: [facility] - Preparing for Surgery  Before surgery, you can play an important role.  Because skin is not sterile, your skin needs to be as free of germs as possible.  You can reduce the number of germs on you skin by washing with CHG (chlorahexidine gluconate) soap before surgery.  CHG is an antiseptic cleaner which kills germs and bonds with the skin to continue killing germs even after washing.  Please DO NOT use if you have an allergy to CHG or antibacterial soaps.  If your skin becomes reddened/irritated stop using the CHG and inform your nurse when you arrive at Short Stay.  Do not shave (including legs and underarms) for at least 48 hours prior to the first CHG shower.  You may shave your  face.  Please follow these instructions carefully:   1.  Shower with CHG Soap the night before surgery and the                                morning of Surgery.  2.  If you choose to wash your hair, wash your hair first as usual with your       normal shampoo.  3.  After you shampoo, rinse your hair and body thoroughly to remove the                      Shampoo.  4.  Use CHG as you would any other liquid soap.  You can apply chg directly       to the skin and wash gently with scrungie or a clean washcloth.  5.  Apply the CHG Soap to your body ONLY FROM THE NECK DOWN.        Do not use on open wounds or open sores.  Avoid contact with your eyes, ears, mouth and genitals (private parts).  Wash genitals (private parts) with your normal soap.  6.  Wash thoroughly, paying special attention to the area where your surgery        will be performed.  7.  Thoroughly rinse your body with warm water from the neck down.  8.  DO NOT shower/wash with your normal soap after using and rinsing off       the CHG Soap.  9.  Pat yourself dry with a clean towel.            10.  Wear clean pajamas.            11.  Place clean sheets on your bed the night of your first shower and do not        sleep with pets.  Day of Surgery  Do not apply any lotions the morning of surgery.  Please wear clean clothes to the hospital.     Please read over the following fact sheets that you were given: Pain Booklet, Coughing and Deep Breathing, Blood Transfusion Information, MRSA Information and Surgical Site Infection Prevention

## 2014-07-06 LAB — URINE CULTURE
COLONY COUNT: NO GROWTH
Culture: NO GROWTH

## 2014-07-06 NOTE — H&P (Addendum)
CHIEF COMPLAINT:  Painful left knee.   HISTORY OF PRESENT ILLNESS:  Wendy Reeves is a very pleasant 59 year old white female who is seen today for evaluation of her left knee.  She is status post right total knee arthroplasty a little over 2 years.  She has done extremely well and has essentially no problems with the right knee.  Her husband and her have moved to [facility] and she has been working at the [employer] and has started exhibiting significant pain and discomfort in the left knee.  She has been having a lot of popping and clicking in the knee and pain, especially after she sits for long periods of time.  She is also having symptoms of giving way of the knee with instability pattern.  No recent history of injury or trauma.  She is having difficulty now with her activities of daily living and the pain.  It will wake her at nighttime also.  It is getting to the point where she has pain with every step.  She has not gotten to the point where she has had to use a cane, but certainly does on occasion hold onto objects as she walks.  She has already been through corticosteroid injections and  Viscosupplementation by a local orthopaedist in the [place] area.  She comes in today for evaluation.   PAST MEDICAL HISTORY/GENERAL HEALTH:  Good.   HOSPITALIZATIONS:  1981 for cesarean section, 1985 for cesarean section, 1982 for open cholecystectomy, hiatal hernia repair, 2014 right total knee arthroplasty, 2000 for total abdominal hysterectomy, bilateral salpingo-oophorectomy.   CURRENT MEDICATIONS:  Cymbalta 60 mg b.i.d., trazodone 150 mg nightly, vitamin, Voltaren gel 4 mg q.i.d. to the knee.   ALLERGIES:  Penicillin, which apparently she had a rash with that in sulfa.  Not really sure if she had hives, but she has a scant remembrance of that, but that is when she was a child.     FAMILY HISTORY:  Positive for mother who is still alive at age 48 with hypertension and  arthritis.  Father who died in 52 with cancer, apparently was prostate with metastasis of the bone.  He is also a diabetic.  She has 2 living brothers age 31 and 41, 1 living sister at age 50.     SOCIAL HISTORY: She is a 59 year old white female who is married.  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  She denies the use of tobacco.  She does occasionally social drinking, which is basically once probably every 2 to 3 months.      REVIEW OF SYSTEMS: A 14-point review of systems positive for glasses.  She had pneumonia and bronchitis back in 2006.  She has had ulcers in the past without surgical intervention.  She has had recent weight gain.   PHYSICAL EXAMINATION:  A very pleasant 59 year old white female, well-developed, well-nourished, alert, pleasant, cooperative, moderate distress secondary to left knee pain.  Height is 66-1/4 inches, weight 188 pounds, BMI 30.    Temperature 97.7, pulse 72, respirations 16, blood pressure 118/64.    Head is normocephalic.   Eyes:  Pupils equal, round and reactive to light and accommodation with extraocular movements intact.   Ears, nose and throat are benign.   Chest had good expansion.   Lungs were essentially clear.  She has coarse breath sounds.   Cardiac had regular rhythm and rate, normal S1, S2.  No murmurs, rubs or gallops appreciated.  Pulses 2+ bilateral and  symmetric in lower extremity.   Neck was supple, no carotid bruits noted.   Abdomen scaphoid, soft, nontender, no mass palpable, normal bowel sounds present.   Genital, rectal and breast exam not indicated for an orthopaedic evaluation.   Musculoskeletal:  She has a 10-degree flexion contracture with further motion to 100 degrees.  She has medial and lateral joint line pain.  She does have crepitus with range of motion.  Trace effusion.  She has some pseudolaxity with varus and valgus stressing.  She does have end points though.     RADIOGRAPHS:  Radiographic studies reveals  end stage bone on bone medial compartment OA with peri-articular spurring more medial than lateral joint.  She does have sclerosing noted in both the tibial plateau more than the femoral condyle.  She also has significant patellofemoral OA noted with peri-articular spurring.   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     PLAN:   1.  At this time, I have reviewed evaluations by Dr. Shaune PollackEd Hawkins who feels that she is a candidate for surgery from a medical clearance. 2.  Therefore, procedure risks and benefits were given to her.  She is understanding.  All questions were answered.  The models were used to show the procedure.  She would like to proceed with this in the very near future.   Oris DroneBrian D. Aleda Granaetrarca, PA-C [facility] [phone]  08/07/2014 11:08 PM

## 2014-08-08 ENCOUNTER — Encounter (HOSPITAL_COMMUNITY)
Admission: RE | Admit: 2014-08-08 | Discharge: 2014-08-08 | Disposition: A | Discharge status: Home / Self Care | Payer: BC Managed Care – PPO | Source: Ambulatory Visit | Attending: Orthopaedic Surgery | Admitting: Orthopaedic Surgery

## 2014-08-08 ENCOUNTER — Encounter (HOSPITAL_COMMUNITY): Payer: Self-pay

## 2014-08-08 HISTORY — DX: Nausea with vomiting, unspecified: R11.2

## 2014-08-08 HISTORY — DX: Other specified postprocedural states: Z98.890

## 2014-08-08 LAB — COMPREHENSIVE METABOLIC PANEL
ALT: 17 U/L (ref 14–54)
AST: 19 U/L (ref 15–41)
Albumin: 3.8 g/dL (ref 3.5–5.0)
Alkaline Phosphatase: 56 U/L (ref 38–126)
Anion gap: 6 (ref 5–15)
BUN: 10 mg/dL (ref 6–20)
CALCIUM: 8.9 mg/dL (ref 8.9–10.3)
CO2: 26 mmol/L (ref 22–32)
Chloride: 106 mmol/L (ref 101–111)
Creatinine, Ser: 0.61 mg/dL (ref 0.44–1.00)
GFR calc Af Amer: >60 mL/min (ref >60)
Glucose, Bld: 93 mg/dL (ref 70–99)
Potassium: 4 mmol/L (ref 3.5–5.1)
SODIUM: 138 mmol/L (ref 135–145)
TOTAL PROTEIN: 6.3 g/dL — AB (ref 6.5–8.1)
Total Bilirubin: 0.5 mg/dL (ref 0.3–1.2)

## 2014-08-08 LAB — CBC WITH DIFFERENTIAL/PLATELET
Basophils Absolute: 0.1 10*3/uL (ref 0.0–0.1)
Basophils Relative: 1 % (ref 0–1)
Eosinophils Absolute: 0.3 10*3/uL (ref 0.0–0.7)
Eosinophils Relative: 6 % — ABNORMAL HIGH (ref 0–5)
HEMATOCRIT: 40.7 % (ref 36.0–46.0)
HEMOGLOBIN: 13.2 g/dL (ref 12.0–15.0)
LYMPHS PCT: 28 % (ref 12–46)
Lymphs Abs: 1.5 10*3/uL (ref 0.7–4.0)
MCH: 28.4 pg (ref 26.0–34.0)
MCHC: 32.4 g/dL (ref 30.0–36.0)
MCV: 87.7 fL (ref 78.0–100.0)
MONO ABS: 0.3 10*3/uL (ref 0.1–1.0)
MONOS PCT: 6 % (ref 3–12)
Neutro Abs: 3.1 10*3/uL (ref 1.7–7.7)
Neutrophils Relative %: 59 % (ref 43–77)
Platelets: 243 10*3/uL (ref 150–400)
RBC: 4.64 MIL/uL (ref 3.87–5.11)
RDW: 12.9 % (ref 11.5–15.5)
WBC: 5.3 10*3/uL (ref 4.0–10.5)

## 2014-08-08 LAB — URINALYSIS, ROUTINE W REFLEX MICROSCOPIC
Bilirubin Urine: NEGATIVE
Glucose, UA: NEGATIVE mg/dL
Hgb urine dipstick: NEGATIVE
Ketones, ur: NEGATIVE mg/dL
Nitrite: NEGATIVE
Protein, ur: NEGATIVE mg/dL
Specific Gravity, Urine: 1.006 (ref 1.005–1.030)
Urobilinogen, UA: 0.2 mg/dL (ref 0.0–1.0)
pH: 7.5 (ref 5.0–8.0)

## 2014-08-08 LAB — PROTIME-INR
INR: 1 (ref 0.00–1.49)
PROTHROMBIN TIME: 13.3 s (ref 11.6–15.2)

## 2014-08-08 LAB — URINE MICROSCOPIC-ADD ON

## 2014-08-08 LAB — SURGICAL PCR SCREEN
MRSA, PCR: NEGATIVE
STAPHYLOCOCCUS AUREUS: NEGATIVE

## 2014-08-08 LAB — APTT: aPTT: 29 seconds (ref 24–37)

## 2014-08-08 MED ORDER — SODIUM CHLORIDE 0.9 % IV SOLN: INTRAVENOUS | Status: DC

## 2014-08-08 MED ORDER — VANCOMYCIN HCL IN DEXTROSE 1-5 GM/200ML-% IV SOLN
1000.0000 mg | INTRAVENOUS | Status: AC
  Administered 2014-08-09: 1000 mg via INTRAVENOUS
  Filled 2014-08-08: qty 200

## 2014-08-08 MED ORDER — ACETAMINOPHEN 10 MG/ML IV SOLN: 1000.0000 mg | INTRAVENOUS | Status: DC

## 2014-08-08 NOTE — Pre-Procedure Instructions (Signed)
Wendy [name]  08/08/2014   Your procedure is scheduled on:  08-09-2014   Tuesday   [facility] at 5:30 AM.   Call this number if you have problems the morning of surgery: [phone]   Remember:   Do not eat food or drink liquids after midnight.    Take these medicines the morning of surgery with A SIP OF WATER: Restasis eye drops,cymbalta     Do not wear jewelry, make-up or nail polish.  Do not wear lotions, powders, or perfumes.   Do not shave legs or underarms 48 hours prior to surgery    Do not bring valuables to the hospital.  [facility] is not responsible for any belongings or valuables.               Contacts, dentures or bridgework may not be worn into surgery.   Leave suitcase in the car. After surgery it may be brought to your room.   For patients admitted to the hospital, discharge time is determined by your  treatment team.                   Special Instructions: See attached sheet for instructions on CHG shower/bath     Please read over the following fact sheets that you were given: Pain Booklet, Coughing and Deep Breathing, Blood Transfusion Information and Surgical Site Infection Prevention

## 2014-08-09 ENCOUNTER — Inpatient Hospital Stay (HOSPITAL_COMMUNITY)
Admission: RE | Admit: 2014-08-09 | Discharge: 2014-08-11 | DRG: 470 | Disposition: A | Discharge status: Home / Self Care | Payer: BC Managed Care – PPO | Source: Ambulatory Visit | Attending: Orthopaedic Surgery | Admitting: Orthopaedic Surgery

## 2014-08-09 ENCOUNTER — Encounter (HOSPITAL_COMMUNITY)
Admission: RE | Disposition: A | Discharge status: Home / Self Care | Payer: Self-pay | Source: Ambulatory Visit | Attending: Orthopaedic Surgery

## 2014-08-09 ENCOUNTER — Encounter (HOSPITAL_COMMUNITY): Payer: Self-pay | Admitting: *Deleted

## 2014-08-09 ENCOUNTER — Inpatient Hospital Stay (HOSPITAL_COMMUNITY): Payer: BC Managed Care – PPO | Admitting: Certified Registered Nurse Anesthetist

## 2014-08-09 DIAGNOSIS — Z96651 Presence of right artificial knee joint: Secondary | ICD-10-CM | POA: Diagnosis present

## 2014-08-09 DIAGNOSIS — M1712 Unilateral primary osteoarthritis, left knee: Principal | ICD-10-CM | POA: Diagnosis present

## 2014-08-09 DIAGNOSIS — M25562 Pain in left knee: Secondary | ICD-10-CM | POA: Diagnosis present

## 2014-08-09 DIAGNOSIS — Z8701 Personal history of pneumonia (recurrent): Secondary | ICD-10-CM

## 2014-08-09 DIAGNOSIS — Z9049 Acquired absence of other specified parts of digestive tract: Secondary | ICD-10-CM | POA: Diagnosis present

## 2014-08-09 DIAGNOSIS — Z880 Allergy status to penicillin: Secondary | ICD-10-CM | POA: Diagnosis not present

## 2014-08-09 DIAGNOSIS — Z882 Allergy status to sulfonamides status: Secondary | ICD-10-CM | POA: Diagnosis not present

## 2014-08-09 DIAGNOSIS — K219 Gastro-esophageal reflux disease without esophagitis: Secondary | ICD-10-CM | POA: Diagnosis present

## 2014-08-09 DIAGNOSIS — F329 Major depressive disorder, single episode, unspecified: Secondary | ICD-10-CM | POA: Diagnosis present

## 2014-08-09 DIAGNOSIS — K5900 Constipation, unspecified: Secondary | ICD-10-CM | POA: Diagnosis present

## 2014-08-09 DIAGNOSIS — Z96659 Presence of unspecified artificial knee joint: Secondary | ICD-10-CM

## 2014-08-09 DIAGNOSIS — Z90710 Acquired absence of both cervix and uterus: Secondary | ICD-10-CM

## 2014-08-09 HISTORY — PX: TOTAL KNEE ARTHROPLASTY: SHX125

## 2014-08-09 LAB — URINE CULTURE: Colony Count: 25000

## 2014-08-09 SURGERY — ARTHROPLASTY, KNEE, TOTAL
Anesthesia: Monitor Anesthesia Care | Site: Knee | Laterality: Left

## 2014-08-09 MED ORDER — METHOCARBAMOL 500 MG PO TABS
ORAL_TABLET | ORAL | Status: AC
  Administered 2014-08-09: 500 mg
  Filled 2014-08-09: qty 1

## 2014-08-09 MED ORDER — SODIUM CHLORIDE 0.9 % IV SOLN
INTRAVENOUS | Status: DC
  Administered 2014-08-10: 03:00:00 via INTRAVENOUS

## 2014-08-09 MED ORDER — MIDAZOLAM HCL 2 MG/2ML IJ SOLN
INTRAMUSCULAR | Status: AC
  Filled 2014-08-09: qty 2

## 2014-08-09 MED ORDER — BUPIVACAINE-EPINEPHRINE (PF) 0.25% -1:200000 IJ SOLN
INTRAMUSCULAR | Status: AC
  Filled 2014-08-09: qty 30

## 2014-08-09 MED ORDER — HYDROMORPHONE HCL 1 MG/ML IJ SOLN
0.2500 mg | INTRAMUSCULAR | Status: DC | PRN
  Administered 2014-08-09 (×4): 0.5 mg via INTRAVENOUS

## 2014-08-09 MED ORDER — METOCLOPRAMIDE HCL 5 MG PO TABS: 5.0000 mg | ORAL_TABLET | Freq: Three times a day (TID) | ORAL | Status: DC | PRN

## 2014-08-09 MED ORDER — ROPIVACAINE HCL 5 MG/ML IJ SOLN
INTRAMUSCULAR | Status: DC | PRN
  Administered 2014-08-09: 20 mL via PERINEURAL

## 2014-08-09 MED ORDER — PROPOFOL INFUSION 10 MG/ML OPTIME
INTRAVENOUS | Status: DC | PRN
  Administered 2014-08-09: 50 ug/kg/min via INTRAVENOUS

## 2014-08-09 MED ORDER — POLYVINYL ALCOHOL-POVIDONE 1.4-0.6 % OP SOLN: 1.0000 [drp] | OPHTHALMIC | Status: DC | PRN

## 2014-08-09 MED ORDER — OXYCODONE HCL 5 MG PO TABS
5.0000 mg | ORAL_TABLET | ORAL | Status: DC | PRN
  Administered 2014-08-09 – 2014-08-11 (×10): 10 mg via ORAL
  Filled 2014-08-09 (×10): qty 2

## 2014-08-09 MED ORDER — CYCLOSPORINE 0.05 % OP EMUL
1.0000 [drp] | Freq: Two times a day (BID) | OPHTHALMIC | Status: DC
  Administered 2014-08-09 – 2014-08-11 (×5): 1 [drp] via OPHTHALMIC
  Filled 2014-08-09 (×8): qty 1

## 2014-08-09 MED ORDER — LACTATED RINGERS IV SOLN
INTRAVENOUS | Status: DC | PRN
  Administered 2014-08-09 (×2): via INTRAVENOUS

## 2014-08-09 MED ORDER — ACETAMINOPHEN 10 MG/ML IV SOLN
1000.0000 mg | Freq: Four times a day (QID) | INTRAVENOUS | Status: AC
  Administered 2014-08-09 – 2014-08-10 (×4): 1000 mg via INTRAVENOUS
  Filled 2014-08-09 (×4): qty 100

## 2014-08-09 MED ORDER — DIPHENHYDRAMINE HCL 50 MG/ML IJ SOLN
INTRAMUSCULAR | Status: DC | PRN
  Administered 2014-08-09: 25 mg via INTRAVENOUS

## 2014-08-09 MED ORDER — SODIUM CHLORIDE 0.9 % IR SOLN
Status: DC | PRN
  Administered 2014-08-09: 1000 mL

## 2014-08-09 MED ORDER — ESOMEPRAZOLE MAGNESIUM 40 MG PO PACK: 40.0000 mg | PACK | Freq: Every day | ORAL | Status: DC

## 2014-08-09 MED ORDER — POLYETHYLENE GLYCOL 3350 17 G PO PACK
17.0000 g | PACK | Freq: Every day | ORAL | Status: DC | PRN
  Filled 2014-08-09: qty 1

## 2014-08-09 MED ORDER — VANCOMYCIN HCL IN DEXTROSE 1-5 GM/200ML-% IV SOLN
1000.0000 mg | Freq: Two times a day (BID) | INTRAVENOUS | Status: AC
  Administered 2014-08-09: 1000 mg via INTRAVENOUS
  Filled 2014-08-09: qty 200

## 2014-08-09 MED ORDER — METHOCARBAMOL 1000 MG/10ML IJ SOLN
500.0000 mg | Freq: Four times a day (QID) | INTRAVENOUS | Status: DC | PRN
  Filled 2014-08-09: qty 5

## 2014-08-09 MED ORDER — PANTOPRAZOLE SODIUM 40 MG PO TBEC
40.0000 mg | DELAYED_RELEASE_TABLET | Freq: Every day | ORAL | Status: DC
  Administered 2014-08-10 – 2014-08-11 (×2): 40 mg via ORAL
  Filled 2014-08-09 (×2): qty 1

## 2014-08-09 MED ORDER — DIPHENHYDRAMINE HCL 50 MG/ML IJ SOLN
12.5000 mg | Freq: Once | INTRAMUSCULAR | Status: AC
  Administered 2014-08-09: 12.5 mg via INTRAVENOUS

## 2014-08-09 MED ORDER — CHLORHEXIDINE GLUCONATE 4 % EX LIQD
60.0000 mL | Freq: Once | CUTANEOUS | Status: DC
  Filled 2014-08-09: qty 60

## 2014-08-09 MED ORDER — PROPOFOL 10 MG/ML IV BOLUS
INTRAVENOUS | Status: AC
  Filled 2014-08-09: qty 20

## 2014-08-09 MED ORDER — METOCLOPRAMIDE HCL 5 MG/ML IJ SOLN: 5.0000 mg | Freq: Three times a day (TID) | INTRAMUSCULAR | Status: DC | PRN

## 2014-08-09 MED ORDER — MIDAZOLAM HCL 5 MG/5ML IJ SOLN
INTRAMUSCULAR | Status: DC | PRN
  Administered 2014-08-09: 2 mg via INTRAVENOUS

## 2014-08-09 MED ORDER — MENTHOL 3 MG MT LOZG: 1.0000 | LOZENGE | OROMUCOSAL | Status: DC | PRN

## 2014-08-09 MED ORDER — DIPHENHYDRAMINE HCL 50 MG/ML IJ SOLN
INTRAMUSCULAR | Status: AC
  Filled 2014-08-09: qty 1

## 2014-08-09 MED ORDER — BUPIVACAINE-EPINEPHRINE 0.25% -1:200000 IJ SOLN
INTRAMUSCULAR | Status: DC | PRN
  Administered 2014-08-09: 30 mL

## 2014-08-09 MED ORDER — KETOROLAC TROMETHAMINE 15 MG/ML IJ SOLN: 15.0000 mg | Freq: Four times a day (QID) | INTRAMUSCULAR | Status: DC

## 2014-08-09 MED ORDER — ONDANSETRON HCL 4 MG/2ML IJ SOLN: 4.0000 mg | Freq: Four times a day (QID) | INTRAMUSCULAR | Status: DC | PRN

## 2014-08-09 MED ORDER — DOCUSATE SODIUM 100 MG PO CAPS
100.0000 mg | ORAL_CAPSULE | Freq: Two times a day (BID) | ORAL | Status: DC
  Administered 2014-08-09 – 2014-08-11 (×4): 100 mg via ORAL
  Filled 2014-08-09 (×4): qty 1

## 2014-08-09 MED ORDER — MORPHINE SULFATE 2 MG/ML IJ SOLN
2.0000 mg | INTRAMUSCULAR | Status: DC | PRN
  Administered 2014-08-09 – 2014-08-10 (×6): 2 mg via INTRAVENOUS
  Administered 2014-08-11: 4 mg via INTRAVENOUS
  Administered 2014-08-11 (×2): 2 mg via INTRAVENOUS
  Filled 2014-08-09 (×2): qty 1
  Filled 2014-08-09: qty 2
  Filled 2014-08-09 (×6): qty 1

## 2014-08-09 MED ORDER — HYDROMORPHONE HCL 1 MG/ML IJ SOLN
0.5000 mg | INTRAMUSCULAR | Status: DC | PRN
  Administered 2014-08-09: 1 mg via INTRAVENOUS

## 2014-08-09 MED ORDER — METHOCARBAMOL 500 MG PO TABS
500.0000 mg | ORAL_TABLET | Freq: Four times a day (QID) | ORAL | Status: DC | PRN
  Administered 2014-08-09 – 2014-08-11 (×8): 500 mg via ORAL
  Filled 2014-08-09 (×8): qty 1

## 2014-08-09 MED ORDER — DULOXETINE HCL 60 MG PO CPEP
120.0000 mg | ORAL_CAPSULE | Freq: Every day | ORAL | Status: DC
Start: 2014-08-10 — End: 2014-08-11
  Administered 2014-08-10 – 2014-08-11 (×2): 120 mg via ORAL
  Filled 2014-08-09 (×2): qty 2

## 2014-08-09 MED ORDER — DIPHENHYDRAMINE HCL 12.5 MG/5ML PO ELIX: 12.5000 mg | ORAL_SOLUTION | ORAL | Status: DC | PRN

## 2014-08-09 MED ORDER — ONDANSETRON HCL 4 MG PO TABS: 4.0000 mg | ORAL_TABLET | Freq: Four times a day (QID) | ORAL | Status: DC | PRN

## 2014-08-09 MED ORDER — HYDROMORPHONE HCL 1 MG/ML IJ SOLN
INTRAMUSCULAR | Status: AC
  Administered 2014-08-09: 0.5 mg via INTRAVENOUS
  Filled 2014-08-09: qty 1

## 2014-08-09 MED ORDER — POLYVINYL ALCOHOL 1.4 % OP SOLN
1.0000 [drp] | OPHTHALMIC | Status: DC | PRN
  Filled 2014-08-09: qty 15

## 2014-08-09 MED ORDER — FENTANYL CITRATE (PF) 250 MCG/5ML IJ SOLN
INTRAMUSCULAR | Status: AC
  Filled 2014-08-09: qty 5

## 2014-08-09 MED ORDER — HYDROXYZINE HCL 25 MG PO TABS
25.0000 mg | ORAL_TABLET | Freq: Three times a day (TID) | ORAL | Status: DC | PRN
  Administered 2014-08-09: 25 mg via ORAL
  Filled 2014-08-09: qty 1

## 2014-08-09 MED ORDER — HYDROMORPHONE HCL 1 MG/ML IJ SOLN
INTRAMUSCULAR | Status: AC
  Administered 2014-08-09: 1 mg via INTRAVENOUS
  Filled 2014-08-09: qty 1

## 2014-08-09 MED ORDER — TRAZODONE HCL 50 MG PO TABS
150.0000 mg | ORAL_TABLET | Freq: Every day | ORAL | Status: DC
  Administered 2014-08-09 – 2014-08-10 (×2): 150 mg via ORAL
  Filled 2014-08-09 (×4): qty 1

## 2014-08-09 MED ORDER — OXYCODONE HCL 5 MG PO TABS
ORAL_TABLET | ORAL | Status: AC
  Administered 2014-08-09: 10 mg via ORAL
  Filled 2014-08-09: qty 2

## 2014-08-09 MED ORDER — CHLORHEXIDINE GLUCONATE 4 % EX LIQD
60.0000 mL | Freq: Once | CUTANEOUS | Status: DC
Start: 2014-08-09 — End: 2014-08-09
  Filled 2014-08-09: qty 60

## 2014-08-09 MED ORDER — BISACODYL 10 MG RE SUPP: 10.0000 mg | Freq: Every day | RECTAL | Status: DC | PRN

## 2014-08-09 MED ORDER — PHENOL 1.4 % MT LIQD: 1.0000 | OROMUCOSAL | Status: DC | PRN

## 2014-08-09 MED ORDER — MAGNESIUM CITRATE PO SOLN: 1.0000 | Freq: Once | ORAL | Status: AC | PRN

## 2014-08-09 MED ORDER — SODIUM CHLORIDE 0.9 % IR SOLN
Status: DC | PRN
  Administered 2014-08-09: 3000 mL

## 2014-08-09 MED ORDER — DIPHENHYDRAMINE HCL 50 MG/ML IJ SOLN
INTRAMUSCULAR | Status: AC
  Administered 2014-08-09: 12.5 mg via INTRAVENOUS
  Filled 2014-08-09: qty 1

## 2014-08-09 MED ORDER — ALUM & MAG HYDROXIDE-SIMETH 200-200-20 MG/5ML PO SUSP: 30.0000 mL | ORAL | Status: DC | PRN

## 2014-08-09 MED ORDER — BUPIVACAINE IN DEXTROSE 0.75-8.25 % IT SOLN
INTRATHECAL | Status: DC | PRN
  Administered 2014-08-09: 2 mL via INTRATHECAL

## 2014-08-09 MED ORDER — ROCURONIUM BROMIDE 50 MG/5ML IV SOLN
INTRAVENOUS | Status: AC
  Filled 2014-08-09: qty 1

## 2014-08-09 MED ORDER — FENTANYL CITRATE (PF) 100 MCG/2ML IJ SOLN
INTRAMUSCULAR | Status: DC | PRN
  Administered 2014-08-09 (×2): 50 ug via INTRAVENOUS

## 2014-08-09 MED ORDER — LIDOCAINE HCL (CARDIAC) 20 MG/ML IV SOLN
INTRAVENOUS | Status: AC
  Filled 2014-08-09: qty 5

## 2014-08-09 MED ORDER — ONDANSETRON HCL 4 MG/2ML IJ SOLN
INTRAMUSCULAR | Status: AC
  Filled 2014-08-09: qty 2

## 2014-08-09 MED ORDER — RIVAROXABAN 10 MG PO TABS
10.0000 mg | ORAL_TABLET | Freq: Every day | ORAL | Status: DC
  Administered 2014-08-10 – 2014-08-11 (×2): 10 mg via ORAL
  Filled 2014-08-09 (×2): qty 1

## 2014-08-09 SURGICAL SUPPLY — 66 items
BANDAGE ESMARK 6X9 LF (GAUZE/BANDAGES/DRESSINGS) ×1 IMPLANT
BLADE SAGITTAL 25.0X1.19X90 (BLADE) ×2 IMPLANT
BNDG CMPR 9X6 STRL LF SNTH (GAUZE/BANDAGES/DRESSINGS) ×1
BNDG ESMARK 6X9 LF (GAUZE/BANDAGES/DRESSINGS) ×2
BOWL SMART MIX CTS (DISPOSABLE) ×2 IMPLANT
CAP KNEE TOTAL 3 SIGMA ×1 IMPLANT
CEMENT HV SMART SET (Cement) ×4 IMPLANT
COVER SURGICAL LIGHT HANDLE (MISCELLANEOUS) ×2 IMPLANT
CUFF TOURNIQUET SINGLE 34IN LL (TOURNIQUET CUFF) ×1 IMPLANT
CUFF TOURNIQUET SINGLE 44IN (TOURNIQUET CUFF) IMPLANT
DRAPE EXTREMITY T 121X128X90 (DRAPE) ×2 IMPLANT
DRAPE IMP U-DRAPE 54X76 (DRAPES) ×2 IMPLANT
DRAPE PROXIMA HALF (DRAPES) ×2 IMPLANT
DRSG ADAPTIC 3X8 NADH LF (GAUZE/BANDAGES/DRESSINGS) ×2 IMPLANT
DRSG PAD ABDOMINAL 8X10 ST (GAUZE/BANDAGES/DRESSINGS) ×3 IMPLANT
DURAPREP 26ML APPLICATOR (WOUND CARE) ×4 IMPLANT
DURAPREP 6ML APPLICATOR 50/CS (WOUND CARE) ×1 IMPLANT
ELECT CAUTERY BLADE 6.4 (BLADE) ×2 IMPLANT
ELECT REM PT RETURN 9FT ADLT (ELECTROSURGICAL) ×2
ELECTRODE REM PT RTRN 9FT ADLT (ELECTROSURGICAL) ×1 IMPLANT
EVACUATOR 1/8 PVC DRAIN (DRAIN) IMPLANT
FACESHIELD WRAPAROUND (MASK) ×4 IMPLANT
FACESHIELD WRAPAROUND OR TEAM (MASK) ×2 IMPLANT
GAUZE SPONGE 4X4 12PLY STRL (GAUZE/BANDAGES/DRESSINGS) ×2 IMPLANT
GLOVE BIOGEL PI IND STRL 7.0 (GLOVE) IMPLANT
GLOVE BIOGEL PI IND STRL 8 (GLOVE) ×1 IMPLANT
GLOVE BIOGEL PI IND STRL 8.5 (GLOVE) ×1 IMPLANT
GLOVE BIOGEL PI INDICATOR 7.0 (GLOVE) ×4
GLOVE BIOGEL PI INDICATOR 8 (GLOVE) ×1
GLOVE BIOGEL PI INDICATOR 8.5 (GLOVE) ×1
GLOVE ECLIPSE 6.5 STRL STRAW (GLOVE) ×2 IMPLANT
GLOVE ECLIPSE 8.0 STRL XLNG CF (GLOVE) ×4 IMPLANT
GLOVE SURG ORTHO 8.5 STRL (GLOVE) ×4 IMPLANT
GOWN STRL REUS W/ TWL LRG LVL3 (GOWN DISPOSABLE) ×2 IMPLANT
GOWN STRL REUS W/TWL 2XL LVL3 (GOWN DISPOSABLE) ×2 IMPLANT
GOWN STRL REUS W/TWL LRG LVL3 (GOWN DISPOSABLE) ×4
HANDPIECE INTERPULSE COAX TIP (DISPOSABLE) ×2
KIT BASIN OR (CUSTOM PROCEDURE TRAY) ×2 IMPLANT
KIT ROOM TURNOVER OR (KITS) ×2 IMPLANT
MANIFOLD NEPTUNE II (INSTRUMENTS) ×2 IMPLANT
NEEDLE 22X1 1/2 (OR ONLY) (NEEDLE) ×1 IMPLANT
NS IRRIG 1000ML POUR BTL (IV SOLUTION) ×2 IMPLANT
PACK TOTAL JOINT (CUSTOM PROCEDURE TRAY) ×2 IMPLANT
PACK UNIVERSAL I (CUSTOM PROCEDURE TRAY) ×2 IMPLANT
PAD ARMBOARD 7.5X6 YLW CONV (MISCELLANEOUS) ×4 IMPLANT
PAD CAST 4YDX4 CTTN HI CHSV (CAST SUPPLIES) ×1 IMPLANT
PADDING CAST COTTON 4X4 STRL (CAST SUPPLIES) ×2
PADDING CAST COTTON 6X4 STRL (CAST SUPPLIES) ×2 IMPLANT
SET HNDPC FAN SPRY TIP SCT (DISPOSABLE) ×1 IMPLANT
SPONGE GAUZE 4X4 12PLY STER LF (GAUZE/BANDAGES/DRESSINGS) ×1 IMPLANT
STAPLER VISISTAT 35W (STAPLE) ×2 IMPLANT
SUCTION FRAZIER TIP 10 FR DISP (SUCTIONS) ×2 IMPLANT
SUT BONE WAX W31G (SUTURE) ×2 IMPLANT
SUT ETHIBOND NAB CT1 #1 30IN (SUTURE) ×6 IMPLANT
SUT MNCRL AB 3-0 PS2 18 (SUTURE) ×2 IMPLANT
SUT VIC AB 0 CT1 27 (SUTURE) ×2
SUT VIC AB 0 CT1 27XBRD ANBCTR (SUTURE) ×1 IMPLANT
SUT VIC AB 1 CT1 27 (SUTURE) ×2
SUT VIC AB 1 CT1 27XBRD ANBCTR (SUTURE) ×1 IMPLANT
SYR CONTROL 10ML LL (SYRINGE) ×1 IMPLANT
TOWEL OR 17X24 6PK STRL BLUE (TOWEL DISPOSABLE) ×2 IMPLANT
TOWEL OR 17X26 10 PK STRL BLUE (TOWEL DISPOSABLE) ×2 IMPLANT
TRAY FOLEY SILVER 16FR TEMP (SET/KITS/TRAYS/PACK) ×1 IMPLANT
TRAY FOLEY W/METER SILVER 16FR (SET/KITS/TRAYS/PACK) IMPLANT
WATER STERILE IRR 1000ML POUR (IV SOLUTION) ×4 IMPLANT
WRAP KNEE MAXI GEL POST OP (GAUZE/BANDAGES/DRESSINGS) ×2 IMPLANT

## 2014-08-09 NOTE — H&P (Signed)
  The recent History & Physical has been reviewed. I have personally examined the patient today. There is no interval change to the documented History & Physical. The patient would like to proceed with the procedure.  Norlene CampbellWHITFIELD, Arienne Gartin W 08/09/2014,  7:07 AM

## 2014-08-09 NOTE — Anesthesia Preprocedure Evaluation (Addendum)
Anesthesia Evaluation  Patient identified by MRN, date of birth, ID band Patient awake    Reviewed: Allergy & Precautions, NPO status , Patient's Chart, lab work & pertinent test results  History of Anesthesia Complications Negative for: history of anesthetic complications  Airway Mallampati: II  TM Distance: >3 FB Neck ROM: Full    Dental  (+) Teeth Intact   Pulmonary neg pulmonary ROS,  breath sounds clear to auscultation        Cardiovascular negative cardio ROS  Rhythm:Regular     Neuro/Psych negative neurological ROS     GI/Hepatic Neg liver ROS, GERD-  Medicated and Controlled,  Endo/Other  negative endocrine ROS  Renal/GU negative Renal ROS     Musculoskeletal  (+) Arthritis -,   Abdominal   Peds  Hematology negative hematology ROS (+)   Anesthesia Other Findings   Reproductive/Obstetrics                             Anesthesia Physical Anesthesia Plan  ASA: I  Anesthesia Plan: MAC, Regional and Spinal   Post-op Pain Management:    Induction:   Airway Management Planned: Nasal Cannula  Additional Equipment: None  Intra-op Plan:   Post-operative Plan:   Informed Consent: I have reviewed the patients History and Physical, chart, labs and discussed the procedure including the risks, benefits and alternatives for the proposed anesthesia with the patient or authorized representative who has indicated his/her understanding and acceptance.   Dental advisory given  Plan Discussed with: CRNA and Surgeon  Anesthesia Plan Comments:         Anesthesia Quick Evaluation

## 2014-08-09 NOTE — Op Note (Signed)
PATIENT ID:      Karen KaysGloria J Rossie  MRN:     [id number] DOB/AGE:    [birth year] / 59 y.o.       OPERATIVE REPORT    DATE OF PROCEDURE:  08/09/2014       PREOPERATIVE DIAGNOSIS:PRIMARY, END STAGE   LEFT KNEE OSTEOARTHRITIS                                                       Estimated body mass index is 30.36 kg/(m^2) as calculated from the following:   Height as of this encounter: 5\' 6"  (1.676 m).   Weight as of this encounter: 85.276 kg (188 lb).     POSTOPERATIVE DIAGNOSIS:   LEFT KNEE OSTEOARTHRITIS-SAME                                                                    Estimated body mass index is 30.36 kg/(m^2) as calculated from the following:   Height as of this encounter: 5\' 6"  (1.676 m).   Weight as of this encounter: 85.276 kg (188 lb).     PROCEDURE:  Procedure(s):LEFT TOTAL KNEE ARTHROPLASTY      SURGEON:  Norlene CampbellPeter Brieann Osinski, MD    ASSISTANT:   Jacqualine CodeBrian Petrarca, PA-C   (Present and scrubbed throughout the case, critical for assistance with exposure, retraction, instrumentation, and closure.)          ANESTHESIA: regional and spinal     DRAINS: (LEFT KNEE) Hemovact drain(s) in the CLAMPED with  Suction Clamped :      TOURNIQUET TIME:  Total Tourniquet Time Documented: Thigh (Left) - 65 minutes Total: Thigh (Left) - 65 minutes     COMPLICATIONS:  None   CONDITION:  stable  PROCEDURE IN DETAIL: [id number]: [id number]   Ronnett Pullin W 08/09/2014, 9:14 AM

## 2014-08-09 NOTE — Transfer of Care (Signed)
Immediate Anesthesia Transfer of Care Note  Patient: Wendy Reeves  Procedure(s) Performed: Procedure(s): TOTAL KNEE ARTHROPLASTY (Left)  Patient Location: PACU  Anesthesia Type:MAC and Spinal  Level of Consciousness: awake, alert  and oriented  Airway & Oxygen Therapy: Patient Spontanous Breathing and Patient connected to nasal cannula oxygen  Post-op Assessment: Report given to RN and Post -op Vital signs reviewed and stable  Post vital signs: Reviewed and stable  Last Vitals:  Filed Vitals:   08/09/14 0553  BP: 126/66  Pulse: 64  Temp: 36.5 C  Resp: 20    Complications: No apparent anesthesia complications

## 2014-08-09 NOTE — Anesthesia Procedure Notes (Signed)
Spinal Patient location during procedure: OR Staffing Anesthesiologist: Lakiyah Arntson, CHRIS Preanesthetic Checklist Completed: patient identified, surgical consent, pre-op evaluation, timeout performed, IV checked, risks and benefits discussed and monitors and equipment checked Spinal Block Patient position: sitting Prep: site prepped and draped and DuraPrep Patient monitoring: heart rate, cardiac monitor, continuous pulse ox and blood pressure Approach: midline Location: L3-4 Injection technique: single-shot Needle Needle type: Pencan  Needle gauge: 24 G Needle length: 10 cm Assessment Sensory level: T6  Anesthesia Regional Block:  Femoral nerve block  Pre-Anesthetic Checklist: ,, timeout performed, Correct Patient, Correct Site, Correct Laterality, Correct Procedure, Correct Position, site marked, Risks and benefits discussed,  Surgical consent,  Pre-op evaluation,  At surgeon's request and post-op pain management  Laterality: Lower and Left  Prep: chloraprep       Needles:  Injection technique: Single-shot  Needle Type: Echogenic Stimulator Needle          Additional Needles:  Procedures: ultrasound guided (picture in chart) and nerve stimulator Femoral nerve block  Nerve Stimulator or Paresthesia:  Response: quad, 0.5 mA,   Additional Responses:   Narrative:  Injection made incrementally with aspirations every 5 mL.  Performed by: Personally  Anesthesiologist: Robb Sibal, CHRIS  Additional Notes: H+P and labs reviewed, risks and benefits discussed with patient, procedure tolerated well without complications

## 2014-08-09 NOTE — Anesthesia Postprocedure Evaluation (Signed)
  Anesthesia Post-op Note  Patient: [name]  Procedure(s) Performed: Procedure(s): TOTAL KNEE ARTHROPLASTY (Left)  Patient Location: PACU  Anesthesia Type:Regional and Spinal  Level of Consciousness: awake  Airway and Oxygen Therapy: Patient Spontanous Breathing  Post-op Pain: mild  Post-op Assessment: Post-op Vital signs reviewed, Patient's Cardiovascular Status Stable, Respiratory Function Stable, Patent Airway, No signs of Nausea or vomiting and Pain level controlled  Post-op Vital Signs: Reviewed and stable  Last Vitals:  Filed Vitals:   08/09/14 1346  BP: 138/79  Pulse: 70  Temp: 37 C  Resp: 16    Complications: No apparent anesthesia complications

## 2014-08-09 NOTE — Progress Notes (Signed)
Orthopedic Tech Progress Note Patient Details:  Wendy [name] 29-Aug-1955 [id number]  CPM Left Knee CPM Left Knee: On Left Knee Flexion (Degrees): 90 Left Knee Extension (Degrees): 0 Additional Comments: trapeze bar patient helper Viewed order from doctor's order list  Nikki DomCrawford, Darsh Vandevoort 08/09/2014, 10:07 AM

## 2014-08-09 NOTE — Evaluation (Signed)
Physical Therapy Evaluation Patient Details Name: Wendy KaysGloria J Schellhorn MRN: [id number] DOB: 26-Dec-1955 Today's Date: 08/09/2014   History of Present Illness  Pt is a 59 y/o F s/p L TKA.  Pt's PMH includes depression, depression, PONV, and R TKA.  Clinical Impression  Pt is s/p L TKA resulting in the deficits listed below (see PT Problem List). Pt extremely lethargic this session but demonstrated ability to ambulate 5 ft w/ RW.  Pt will benefit from skilled PT to increase their independence and safety with mobility to allow discharge to the venue listed below.      Follow Up Recommendations Home health PT;Supervision - Intermittent    Equipment Recommendations  Rolling walker with 5" wheels;3in1 (PT)    Recommendations for Other Services OT consult     Precautions / Restrictions Precautions Precautions: Fall;Knee Precaution Booklet Issued: Yes (comment) Precaution Comments: Reviewed no pillow under knee Restrictions Weight Bearing Restrictions: Yes LLE Weight Bearing: Partial weight bearing LLE Partial Weight Bearing Percentage or Pounds: 50      Mobility  Bed Mobility Overal bed mobility: Modified Independent             General bed mobility comments: Cues for leg hook technique which pt was able to demonstrate to transfer to sitting EOB.  Mod use of bed rails, increased time.     Transfers Overall transfer level: Needs assistance Equipment used: Rolling walker (2 wheeled) Transfers: Sit to/from Stand Sit to Stand: Min guard;From elevated surface         General transfer comment: Cues for hand placement and reminder of WB status.  Sit>stand from elevated surface.  Ambulation/Gait Ambulation/Gait assistance: Min guard Ambulation Distance (Feet): 5 Feet Assistive device: Rolling walker (2 wheeled) Gait Pattern/deviations: Step-to pattern;Decreased stride length;Decreased stance time - left;Decreased step length - left;Decreased weight shift to left;Antalgic;Trunk  flexed (hop on RLE)   Gait velocity interpretation: Below normal speed for age/gender General Gait Details: Dec L knee flexion.  Trunk flexion.  Pt requires cues to place L foot flat on floor, pt demonstrates toe touch during stance phase of LLE and performs hop on RLE prior to verbal cues 2/2 pain.  Stairs            Wheelchair Mobility    Modified Rankin (Stroke Patients Only)       Balance Overall balance assessment: Needs assistance Sitting-balance support: Bilateral upper extremity supported;Feet supported Sitting balance-Leahy Scale: Fair     Standing balance support: Bilateral upper extremity supported;During functional activity Standing balance-Leahy Scale: Fair                               Pertinent Vitals/Pain Pain Assessment: 0-10 Pain Score: 5  Pain Location: L knee Pain Descriptors / Indicators: Aching;Moaning;Grimacing;Discomfort Pain Intervention(s): Limited activity within patient's tolerance;Monitored during session;Repositioned    Home Living Family/patient expects to be discharged to:: Private residence Living Arrangements: Children;Parent (mother and daughter) Available Help at Discharge: Family;Available PRN/intermittently Type of Home: House Home Access: Level entry     Home Layout: Two level;Able to live on main level with bedroom/bathroom Home Equipment: None Additional Comments: Mother is 61 y/o and unable to assist pt, pt's mother is independent.  Daughter lives next door who works 4-9pm but will be available to provide assist during day.      Prior Function Level of Independence: Independent               Hand Dominance  Dominant Hand: Right    Extremity/Trunk Assessment   Upper Extremity Assessment: Defer to OT evaluation           Lower Extremity Assessment: LLE deficits/detail   LLE Deficits / Details: weakness and limited ROM as expected s/p L TKA     Communication   Communication: No  difficulties  Cognition Arousal/Alertness: Lethargic Behavior During Therapy: Flat affect Overall Cognitive Status: Within Functional Limits for tasks assessed                      General Comments      Exercises Total Joint Exercises Ankle Circles/Pumps: AROM;Both;15 reps;Supine Quad Sets: AROM;Left;10 reps;Supine Heel Slides: AROM;AAROM;Left;5 reps;Supine      Assessment/Plan    PT Assessment Patient needs continued PT services  PT Diagnosis Difficulty walking;Abnormality of gait;Generalized weakness;Acute pain   PT Problem List Decreased strength;Decreased range of motion;Decreased activity tolerance;Decreased balance;Decreased mobility;Decreased coordination;Decreased knowledge of use of DME;Decreased safety awareness;Decreased knowledge of precautions;Decreased skin integrity;Pain  PT Treatment Interventions DME instruction;Gait training;Stair training;Functional mobility training;Therapeutic activities;Therapeutic exercise;Balance training;Neuromuscular re-education;Modalities;Patient/family education   PT Goals (Current goals can be found in the Care Plan section) Acute Rehab PT Goals Patient Stated Goal: to feel less groggy PT Goal Formulation: With patient Time For Goal Achievement: 08/16/14 Potential to Achieve Goals: Good    Frequency 7X/week   Barriers to discharge Decreased caregiver support Pt w/o assist 4-9pm each day    Co-evaluation               End of Session Equipment Utilized During Treatment: Gait belt Activity Tolerance: Patient limited by lethargy;Patient limited by pain Patient left: in chair;with call bell/phone within reach Nurse Communication: Mobility status;Precautions;Weight bearing status         Time: [id number] PT Time Calculation (min) (ACUTE ONLY): 22 min   Charges:   PT Evaluation $Initial PT Evaluation Tier I: 1 Procedure     PT G Codes:       Michail JewelsAshley Parr PT, DPT [phone] Pager: [phone] 08/09/2014, 4:30  PM

## 2014-08-10 ENCOUNTER — Encounter (HOSPITAL_COMMUNITY): Payer: Self-pay | Admitting: Orthopaedic Surgery

## 2014-08-10 LAB — BASIC METABOLIC PANEL
Anion gap: 10 (ref 5–15)
BUN: 9 mg/dL (ref 6–20)
CALCIUM: 8.1 mg/dL — AB (ref 8.9–10.3)
CO2: 24 mmol/L (ref 22–32)
Chloride: 102 mmol/L (ref 101–111)
Creatinine, Ser: 0.52 mg/dL (ref 0.44–1.00)
GFR calc Af Amer: >60 mL/min (ref >60)
GFR calc non Af Amer: >60 mL/min (ref >60)
Glucose, Bld: 110 mg/dL — ABNORMAL HIGH (ref 70–99)
Potassium: 3.4 mmol/L — ABNORMAL LOW (ref 3.5–5.1)
SODIUM: 136 mmol/L (ref 135–145)

## 2014-08-10 LAB — CBC
HCT: 31.2 % — ABNORMAL LOW (ref 36.0–46.0)
HEMOGLOBIN: 10.3 g/dL — AB (ref 12.0–15.0)
MCH: 29.2 pg (ref 26.0–34.0)
MCHC: 33 g/dL (ref 30.0–36.0)
MCV: 88.4 fL (ref 78.0–100.0)
Platelets: 191 10*3/uL (ref 150–400)
RBC: 3.53 MIL/uL — AB (ref 3.87–5.11)
RDW: 12.9 % (ref 11.5–15.5)
WBC: 6.7 10*3/uL (ref 4.0–10.5)

## 2014-08-10 NOTE — Op Note (Signed)
Wendy Reeves, Wendy Reeves NO.:  [id number]  MEDICAL RECORD NO.:  [id number]  LOCATION:  [id number]                        FACILITY:  [facility]  PHYSICIAN:  Vonna Kotyk. Fenix Rorke, M.D.DATE OF BIRTH:  02-Apr-1955  DATE OF PROCEDURE:  08/09/2014 DATE OF DISCHARGE:                              OPERATIVE REPORT   PREOPERATIVE DIAGNOSIS:  Primary, end-stage osteoarthritis, left knee.  POSTOPERATIVE DIAGNOSIS:  Primary, end-stage osteoarthritis, left knee.  PROCEDURE:  Left total knee replacement.  SURGEON:  Vonna Kotyk. Durward Fortes, MD  ASSISTANT:  Biagio Borg, PA-C  ANESTHESIA:  Spinal with supplemental femoral nerve block.  COMPLICATIONS:  None.  COMPONENTS:  DePuy LCS standard plus femoral component #4 rotating keeled tibial tray with a 10-mm polyethylene bridging bearing a metal- back 3-peg rotating patella.  Components were secured with polymethyl methacrylate.  PROCEDURE IN DETAIL:  Ms. Fang was met with the daughter in the holding area, identified the left knee as the appropriate operative site and marked it accordingly.  She received a femoral nerve block per Anesthesia.  The patient was then transported to room #10 and placed under spinal anesthesia per Anesthesia with mild IV sedation.  Catheter was inserted by the nursing staff.  Urine was clear.  The left lower extremity was then placed in thigh tourniquet.  The leg was prepped with chlorhexidine scrub and DuraPrep x2 from the tourniquet to the tips of the toes.  Sterile draping was performed.  Time-out was called.  The left lower extremity elevated, was Esmarch exsanguinated with a proximal tourniquet at 350 mmHg.  A midline longitudinal incision was made centered about the patella extending from the superior pouch to tibial tubercle.  Via sharp dissection, incision was then carried down to subcutaneous tissue. First layer of capsule was incised in midline.  A medial parapatellar incision was then made with  the Bovie to the deep capsule.  Joint was entered.  There was minimal clear yellow joint effusion.  Patella was everted to 180 degrees laterally and the knee flexed to 90 degrees.  There were large osteophytes along the medial and lateral femoral condyle and medial tibial plateau.  There was complete loss of articular cartilage on the medial compartment on both femur and tibia with eburnated bone.  The patient had a preoperative fixed flexion contracture.  I measured a standard plus femoral component.  Synovectomy was performed, then osteophytes were removed.  First, bony cut was then made transversely in the proximal tibia with a 7-degree angle of declination.  After each bony cut on both the tibia and femur, I checked my alignment with the external guide.  Subsequent cuts were then made on the femur using standard plus femoral jig.  Lamina spreader was inserted medially and laterally, so that I could remove medial and lateral menisci, ACL and PCL and osteophytes in the posterior femoral condyle medially and laterally.  MCL and LCL remained intact throughout the procedure.  A 4-degree distal femoral valgus cut was then made with the standard plus jig.  Final cut was made on the femur for tapering purposes and obtained the center hole.  Retractors were placed about the tibia measured #4 tibial tray, this was pinned in  place.  Center hole made followed by the keeled cut with the tibial jig still in place.  I applied a 10-mm polyethylene bridging bearing as our flexion and extension gaps were perfectly symmetrical 10 mm standard plus trial component was then inserted.  The joint was then reduced and through a full range of motion, we had full extension. There was no opening with varus or valgus stress and excellent tracking of the tibial tray without malrotation.  The patella was then prepared by removing 10 mm of bone leaving 13 mm of patella thickness.  The patella jig was  inserted, 3 holes were made, the trial patella inserted through full range of motion, remained perfectly stable.  Trial components were then removed.  The joint was copiously irrigated with saline solution.  The final components were then impacted with polymethyl methacrylate, initially impacted #4 tibial tray followed by the 10-mm polyethylene bridging bearing and the standard plus femoral component.  Extraneous methacrylate was removed from the periphery of all the components.  The knee in extension, the patella was applied with a clamp and methacrylate.  At approximately 16 minutes, methacrylate had matured, during which time, we injected the knee into the deep capsule with 0.25% Marcaine with epinephrine.  We then inspected the joint.  After methacrylate was matured, we released the tourniquet.  Nice capillary refill to the joint surfaces.  Any gross bleeding was controlled with a Bovie and bone wax was applied to bleeding bone surface.  Medium-size Hemovac was then placed to the lateral capsule.  Joint was again irrigated.  The deep capsule was closed with the running #1 Ethibond.  Superficial capsule closed with running 0 Vicryl, subcu with 2-0 Vicryl and 3-0 Monocryl.  Skin closed with skin clips.  Sterile bulky dressing was applied followed by the patient's support stocking.  The patient tolerated the procedure without complications.     Vonna Kotyk. Durward Fortes, M.D.     PWW/MEDQ  D:  08/09/2014  T:  08/10/2014  Job:  [id number]

## 2014-08-10 NOTE — Progress Notes (Signed)
Patient ID: Wendy Reeves, female   DOB: 01/11/1956, 59 y.o.   MRN: [id number] PATIENT ID: Wendy Reeves        MRN:  [id number]          DOB/AGE: 61/13/1957 / 59 y.o.    Wendy CampbellPeter [name], MD   Wendy CodeBrian Mariluz Crespo, PA-C [address] [place], [place]  [id number]                             [phone]   PROGRESS NOTE  Subjective:  negative for Chest Pain  negative for Shortness of Breath  negative for Nausea/Vomiting   positive for Calf Pain but entire posterior leg    Tolerating Diet: yes         Patient reports pain as moderate.     C/O pain not well controlled as well as pain entire posterior leg pain   Objective: Vital signs in last 24 hours:   Patient Vitals for the past 24 hrs:  BP Temp Temp src Pulse Resp SpO2  08/10/14 0620 110/62 mmHg 98.5 F (36.9 C) - 76 16 95 %  08/09/14 2128 114/67 mmHg 98.9 F (37.2 C) Oral 74 16 97 %  08/09/14 1346 138/79 mmHg 98.6 F (37 C) Axillary 70 16 100 %  08/09/14 1300 - - - 66 14 96 %  08/09/14 1245 - - - 67 14 98 %  08/09/14 1230 121/60 mmHg - - 61 15 98 %  08/09/14 1215 - - - 61 17 99 %  08/09/14 1200 - - - 64 16 98 %  08/09/14 1145 - - - (!) 59 10 97 %  08/09/14 1130 - - - 60 10 96 %  08/09/14 1115 - - - 62 13 97 %  08/09/14 1100 129/62 mmHg - - 63 17 97 %  08/09/14 1045 112/71 mmHg - - 60 15 98 %  08/09/14 1030 120/61 mmHg - - (!) 59 15 99 %  08/09/14 1015 130/64 mmHg - - 60 16 99 %  08/09/14 1000 126/62 mmHg - - 63 12 99 %  08/09/14 0945 123/60 mmHg - - (!) 57 13 100 %  08/09/14 0936 119/61 mmHg 97.9 F (36.6 C) - 63 12 100 %      Intake/Output from previous day:   05/10 0701 - 05/11 0700 In: 1635 [P.O.:240; I.V.:1195] Out: 4155 [Urine:3695; Drains:410]   Intake/Output this shift:       Intake/Output      05/10 0701 - 05/11 0700 05/11 0701 - 05/12 0700   P.O. 240    I.V. (mL/kg) 1195 (14)    IV Piggyback 200    Total Intake(mL/kg) 1635 (19.2)    Urine (mL/kg/hr) 3695 (1.8)    Drains 410 (0.2)    Blood 50 (0)      Total Output 4155     Net -2520             LABORATORY DATA:  Recent Labs  08/08/14 1045  WBC 5.3  HGB 13.2  HCT 40.7  PLT 243    Recent Labs  08/08/14 1045 08/10/14 0540  NA 138 136  K 4.0 3.4*  CL 106 102  CO2 26 24  BUN 10 9  CREATININE 0.61 0.52  GLUCOSE 93 110*  CALCIUM 8.9 8.1*   Lab Results  Component Value Date   INR 1.00 08/08/2014   INR 0.97 07/05/2014   INR 1.01 03/11/2012  Recent Radiographic Studies :  No results found.   Examination:  General appearance: alert, cooperative and moderate distress Resp: clear to auscultation bilaterally Cardio: regular rate and rhythm GI: normal findings: bowel sounds normal  Wound Exam: clean, dry, intact dressing  Drainage:  None: wound tissue dry  Motor Exam: EHL, FHL, Anterior Tibial and Posterior Tibial Intact  Sensory Exam: Superficial Peroneal, Deep Peroneal and Tibial normal  Vascular Exam: Left dorsalis pedis artery has 1+ (weak) pulse   Tender to palpation entire posterior leg from achilles to proximal thigh.  Numbness at proximal thigh where block was placed  Assessment:    1 Day Post-Op  Procedure(s) (LRB): TOTAL KNEE ARTHROPLASTY (Left)  ADDITIONAL DIAGNOSIS:  Active Problems:   Primary osteoarthritis of left knee   S/P total knee replacement using cement   Plan: Physical Therapy as ordered Partial Weight Bearing @ 50% (PWB)  DVT Prophylaxis:  Xarelto, Foot Pumps and TED hose  DISCHARGE PLAN: Home  DISCHARGE NEEDS: HHPT, CPM, Walker and 3-in-1 comode seat           [facility] CenterETRARCA,Tonyetta Berko  08/10/2014 7:48 AM

## 2014-08-10 NOTE — Progress Notes (Signed)
Physical Therapy Treatment Patient Details Name: Wendy [name] MRN: [id number] DOB: 1956/01/12 Today's Date: 08/10/2014    History of Present Illness Pt is a 59 y/o F s/p L TKA.  Pt's PMH includes depression, depression, PONV, and R TKA.    PT Comments    Pt demonstrated ability to ambulate 70 ft and completed therapeutic exercises this session.  Pt limited by severe pain since surgery and nausea.  Pt motivated to progress w/ therapy.  Pt will benefit from continued skilled PT services to increase functional independence and safety.    Follow Up Recommendations  Home health PT;Supervision - Intermittent     Equipment Recommendations  Rolling walker with 5" wheels;3in1 (PT)    Recommendations for Other Services       Precautions / Restrictions Precautions Precautions: Fall;Knee Precaution Booklet Issued: Yes (comment) Precaution Comments: Reviewed no pillow under knee Restrictions Weight Bearing Restrictions: Yes LLE Weight Bearing: Partial weight bearing LLE Partial Weight Bearing Percentage or Pounds: 50    Mobility  Bed Mobility Overal bed mobility: Needs Assistance Bed Mobility: Sit to Supine       Sit to supine: Min assist   General bed mobility comments: Min assist for managing BLEs into bed, pt used leg hook technique.  Pt scooted toward HOB using bed rails and bridging w/ RLE.  Transfers Overall transfer level: Needs assistance Equipment used: Rolling walker (2 wheeled) Transfers: Sit to/from Stand Sit to Stand: Min guard         General transfer comment: Pt slightly impulsive and does not indicate when she is going to stand up.    Ambulation/Gait Ambulation/Gait assistance: Min guard Ambulation Distance (Feet): 70 Feet Assistive device: Rolling walker (2 wheeled) Gait Pattern/deviations: Step-to pattern;Decreased stance time - left;Decreased weight shift to left;Decreased stride length;Antalgic;Trunk flexed   Gait velocity interpretation: Below  normal speed for age/gender General Gait Details: Dec L knee flexion.  Trunk flexed and WB through BUE to adhere to WB precautions.     Stairs            Wheelchair Mobility    Modified Rankin (Stroke Patients Only)       Balance Overall balance assessment: Needs assistance Sitting-balance support: Bilateral upper extremity supported;Feet supported Sitting balance-Leahy Scale: Good     Standing balance support: Bilateral upper extremity supported;During functional activity Standing balance-Leahy Scale: Fair                      Cognition Arousal/Alertness: Awake/alert Behavior During Therapy: Flat affect Overall Cognitive Status: Within Functional Limits for tasks assessed                      Exercises Total Joint Exercises Ankle Circles/Pumps: AROM;Both;10 reps;Seated Quad Sets: AROM;Both;5 reps;Seated Knee Flexion: AROM;Left;AAROM;5 reps;Seated Goniometric ROM: 28-115    General Comments        Pertinent Vitals/Pain Pain Assessment: 0-10 Pain Score: 9  Pain Location: L knee Pain Descriptors / Indicators: Aching;Constant;Guarding;Grimacing;Moaning;Nagging Pain Intervention(s): Limited activity within patient's tolerance;Monitored during session;Repositioned    Home Living                      Prior Function            PT Goals (current goals can now be found in the care plan section) Acute Rehab PT Goals Patient Stated Goal: to not feel neauseous Progress towards PT goals: Progressing toward goals    Frequency  7X/week  PT Plan Current plan remains appropriate    Co-evaluation             End of Session Equipment Utilized During Treatment: Gait belt Activity Tolerance: Patient limited by pain;Other (comment) (limited by nausea) Patient left: in bed;with call bell/phone within reach;with family/visitor present     Time: 1018-1040 PT Time Calculation (min) (ACUTE ONLY): 22 min  Charges:  $Gait Training:  8-22 mins                    G Codes:      Michail JewelsAshley Parr PT, [place] [phone] Pager: [phone] 08/10/2014, 10:50 AM

## 2014-08-10 NOTE — Discharge Instructions (Signed)
Information on my medicine - XARELTO® (Rivaroxaban) ° °This medication education was reviewed with me or my healthcare representative as part of my discharge preparation.  The pharmacist that spoke with me during my hospital stay was:  Aulton Routt George, RPH ° °Why was Xarelto® prescribed for you? °Xarelto® was prescribed for you to reduce the risk of blood clots forming after orthopedic surgery. The medical term for these abnormal blood clots is venous thromboembolism (VTE). ° °What do you need to know about xarelto® ? °Take your Xarelto® ONCE DAILY at the same time every day. °You may take it either with or without food. ° °If you have difficulty swallowing the tablet whole, you may crush it and mix in applesauce just prior to taking your dose. ° °Take Xarelto® exactly as prescribed by your doctor and DO NOT stop taking Xarelto® without talking to the doctor who prescribed the medication.  Stopping without other VTE prevention medication to take the place of Xarelto® may increase your risk of developing a clot. ° °After discharge, you should have regular check-up appointments with your healthcare provider that is prescribing your Xarelto®.   ° °What do you do if you miss a dose? °If you miss a dose, take it as soon as you remember on the same day then continue your regularly scheduled once daily regimen the next day. Do not take two doses of Xarelto® on the same day.  ° °Important Safety Information °A possible side effect of Xarelto® is bleeding. You should call your healthcare provider right away if you experience any of the following: °? Bleeding from an injury or your nose that does not stop. °? Unusual colored urine (red or dark brown) or unusual colored stools (red or black). °? Unusual bruising for unknown reasons. °? A serious fall or if you hit your head (even if there is no bleeding). ° °Some medicines may interact with Xarelto® and might increase your risk of bleeding while on Xarelto®. To help avoid  this, consult your healthcare provider or pharmacist prior to using any new prescription or non-prescription medications, including herbals, vitamins, non-steroidal anti-inflammatory drugs (NSAIDs) and supplements. ° °This website has more information on Xarelto®: www.xarelto.com. ° ° °

## 2014-08-10 NOTE — Plan of Care (Signed)
Problem: Consults Goal: Diagnosis- Total Joint Replacement Outcome: Completed/Met Date Met:  08/10/14 Primary Total Knee LEft

## 2014-08-10 NOTE — Progress Notes (Signed)
Physical Therapy Treatment Patient Details Name: Wendy [name] MRN: [id number] DOB: Jan 15, 1956 Today's Date: 08/10/2014    History of Present Illness Pt is a 59 y/o F s/p L TKA.  Pt's PMH includes depression, depression, PONV, and R TKA.    PT Comments    Patient is making good progress with PT.  From a mobility standpoint anticipate patient will be ready for DC home tomorrow.  Pt is impulsive during the session occasionally reaching for a chair nearby to sit in despite pt's husband following w/ recliner chair and against PT advice.  Pt demonstrated ability to ambulate 100 ft this session w/ 3 sitting rest breaks 2/2 fatigue.   Follow Up Recommendations  Home health PT;Supervision - Intermittent     Equipment Recommendations  Rolling walker with 5" wheels;3in1 (PT)    Recommendations for Other Services       Precautions / Restrictions Precautions Precautions: Fall;Knee Precaution Comments: Reviewed no pillow under knee Restrictions Weight Bearing Restrictions: Yes LLE Weight Bearing: Partial weight bearing LLE Partial Weight Bearing Percentage or Pounds: 50    Mobility  Bed Mobility Overal bed mobility: Modified Independent Bed Mobility: Supine to Sit     Supine to sit: Modified independent (Device/Increase time)     General bed mobility comments: Pt used towel to assist bringing LLE to sitting EOB.  Increased time.  Min verbal cues for technique.  Transfers Overall transfer level: Needs assistance Equipment used: Rolling walker (2 wheeled) Transfers: Sit to/from Stand Sit to Stand: Min guard         General transfer comment: Pt slightly impulsive and does not indicate when she is going to stand up and sit down despite cues to notify PT.    Ambulation/Gait Ambulation/Gait assistance: Min guard Ambulation Distance (Feet): 100 Feet Assistive device: Rolling walker (2 wheeled) Gait Pattern/deviations: Step-to pattern;Step-through pattern;Decreased weight shift  to left;Decreased stride length;Decreased stance time - left;Antalgic;Trunk flexed   Gait velocity interpretation: Below normal speed for age/gender General Gait Details: Dec L knee flexion, improved L foot contact w/ floor during L stance phase.  WB through BUEs to adhere to WB precautions   Stairs            Wheelchair Mobility    Modified Rankin (Stroke Patients Only)       Balance Overall balance assessment: Needs assistance Sitting-balance support: Bilateral upper extremity supported;Feet supported Sitting balance-Leahy Scale: Good     Standing balance support: Bilateral upper extremity supported;During functional activity Standing balance-Leahy Scale: Fair                      Cognition Arousal/Alertness: Awake/alert Behavior During Therapy: WFL for tasks assessed/performed Overall Cognitive Status: Within Functional Limits for tasks assessed                      Exercises Total Joint Exercises Quad Sets: AROM;Both;5 reps;Supine Long Arc Quad: AAROM;Left;10 reps;Seated Knee Flexion: AROM;Left;Standing;10 reps    General Comments General comments (skin integrity, edema, etc.): Pt impulsive and reached for nearby chair in room and hallway w/o communicating to PT, despite pt's husband was following w/ recliner chair.      Pertinent Vitals/Pain Pain Assessment: 0-10 Pain Score: 6  Pain Location: L knee Pain Descriptors / Indicators: Aching;Grimacing;Guarding Pain Intervention(s): Limited activity within patient's tolerance;Monitored during session;Repositioned;RN gave pain meds during session    Home Living  Prior Function            PT Goals (current goals can now be found in the care plan section) Acute Rehab PT Goals Patient Stated Goal: to walk Progress towards PT goals: Progressing toward goals    Frequency  7X/week    PT Plan Current plan remains appropriate    Co-evaluation              End of Session Equipment Utilized During Treatment: Gait belt Activity Tolerance: Patient limited by fatigue Patient left: in chair;with call bell/phone within reach;with family/visitor present     Time: 1610-96041328-1358 PT Time Calculation (min) (ACUTE ONLY): 30 min  Charges:  $Gait Training: 8-22 mins $Therapeutic Exercise: 8-22 mins                    G Codes:      Michail JewelsAshley Parr PT, [place] [phone] Pager: [phone] 08/10/2014, 2:48 PM

## 2014-08-10 NOTE — Evaluation (Signed)
Occupational Therapy Evaluation Patient Details Name: Wendy Reeves MRN: [id number] DOB: [birth year] Today's Date: 08/10/2014    History of Present Illness Pt is a 59 y/o F s/p L TKA.  Pt's PMH includes depression, depression, PONV, and R TKA.   Clinical Impression   PTA pt lived at home and was independent with ADLs. Pt currently limited by decreased ROM and pain which impair her independence with ADLs. Pt will benefit from acute OT to progress to Supervision level to return home with family support. Recommend HHOT to progress towards independence.     Follow Up Recommendations  Home health OT    Equipment Recommendations  3 in 1 bedside comode    Recommendations for Other Services       Precautions / Restrictions Precautions Precautions: Fall;Knee Precaution Booklet Issued: Yes (comment) Precaution Comments: Reviewed no pillow under knee Restrictions Weight Bearing Restrictions: Yes LLE Weight Bearing: Partial weight bearing LLE Partial Weight Bearing Percentage or Pounds: 50      Mobility Bed Mobility Overal bed mobility: Needs Assistance Bed Mobility: Sit to Supine       Sit to supine: Min assist   General bed mobility comments: Min assist for managing BLEs into bed, pt used leg hook technique.  Pt scooted toward HOB using bed rails and bridging w/ RLE.  Transfers Overall transfer level: Needs assistance Equipment used: Rolling walker (2 wheeled) Transfers: Sit to/from Stand Sit to Stand: Min guard         General transfer comment: Pt slightly impulsive and does not indicate when she is going to stand up.           ADL Overall ADL's : Needs assistance/impaired Eating/Feeding: Independent;Sitting   Grooming: Oral care;Min guard;Standing;Wash/dry face   Upper Body Bathing: Set up;Sitting   Lower Body Bathing: Sit to/from stand;Minimal assistance   Upper Body Dressing : Set up;Sitting   Lower Body Dressing: Moderate assistance;Sit to/from  stand   Toilet Transfer: Min guard;Ambulation;RW (BSC over toilet) Toilet Transfer Details (indicate cue type and reason): pt has difficulty with transitions Toileting- ArchitectClothing Manipulation and Hygiene: Min guard;Sit to/from stand       Functional mobility during ADLs: Min guard;Rolling walker General ADL Comments: Pt able to get to EOB with use of gait belt as leg lifter. Pt able to ambulate to bathroom for toileting and stand at sink for grooming. Pt has difficulty with transitions due to L knee pain when bending.      Vision Additional Comments: No change from baseline          Pertinent Vitals/Pain Pain Assessment: 0-10 Pain Score: 9  Pain Location: L knee Pain Descriptors / Indicators: Aching;Constant;Guarding;Grimacing;Moaning;Nagging Pain Intervention(s): Limited activity within patient's tolerance;Monitored during session;Repositioned     Hand Dominance Right   Extremity/Trunk Assessment Upper Extremity Assessment Upper Extremity Assessment: Overall WFL for tasks assessed   Lower Extremity Assessment Lower Extremity Assessment: Defer to PT evaluation   Cervical / Trunk Assessment Cervical / Trunk Assessment: Normal   Communication Communication Communication: No difficulties   Cognition Arousal/Alertness: Awake/alert Behavior During Therapy: Flat affect Overall Cognitive Status: Within Functional Limits for tasks assessed                                Home Living Family/patient expects to be discharged to:: Private residence Living Arrangements: Children;Parent (mother and daughter) Available Help at Discharge: Family;Available PRN/intermittently Type of Home: House Home Access: Level entry  Home Layout: Two level;Able to live on main level with bedroom/bathroom     Bathroom Shower/Tub: Tub/shower unit Shower/tub characteristics: Curtain       Home Equipment: None   Additional Comments: Mother is 61 y/o and unable to assist pt,  pt's mother is independent.  Daughter lives next door who works 4-9pm but will be available to provide assist during day.        Prior Functioning/Environment Level of Independence: Independent             OT Diagnosis: Generalized weakness;Acute pain   OT Problem List: Decreased strength;Decreased range of motion;Decreased activity tolerance;Impaired balance (sitting and/or standing);Decreased safety awareness;Decreased knowledge of use of DME or AE;Decreased knowledge of precautions;Pain   OT Treatment/Interventions: Self-care/ADL training;Therapeutic exercise;Energy conservation;DME and/or AE instruction;Therapeutic activities;Patient/family education;Balance training    OT Goals(Current goals can be found in the care plan section) Acute Rehab OT Goals Patient Stated Goal: to not feel neauseous OT Goal Formulation: With patient Time For Goal Achievement: 08/24/14 Potential to Achieve Goals: Good ADL Goals Pt Will Perform Grooming: with supervision;standing Pt Will Perform Lower Body Bathing: with set-up;with supervision;with adaptive equipment;sit to/from stand Pt Will Perform Lower Body Dressing: with set-up;with supervision;with adaptive equipment;sit to/from stand Pt Will Transfer to Toilet: with supervision;ambulating;bedside commode Pt Will Perform Toileting - Clothing Manipulation and hygiene: with supervision;sit to/from stand Pt Will Perform Tub/Shower Transfer: with supervision;Tub transfer;3 in 1;rolling walker;ambulating  OT Frequency: Min 2X/week    End of Session Equipment Utilized During Treatment: Gait belt;Rolling walker CPM Left Knee CPM Left Knee: Off Additional Comments: removed pt from CPM (on since 6am)  Activity Tolerance: Patient limited by pain Patient left: in chair;with call bell/phone within reach   Time: 1308-65780839-0913 OT Time Calculation (min): 34 min Charges:  OT General Charges $OT Visit: 1 Procedure OT Evaluation $Initial OT Evaluation Tier  I: 1 Procedure OT Treatments $Therapeutic Activity: 8-22 mins G-Codes:    [name], [name] 08/10/2014, 11:08 AM  [name], OTR/L Occupational Therapist [phone] (pager)

## 2014-08-11 LAB — BASIC METABOLIC PANEL
Anion gap: 7 (ref 5–15)
CHLORIDE: 105 mmol/L (ref 101–111)
CO2: 27 mmol/L (ref 22–32)
CREATININE: 0.49 mg/dL (ref 0.44–1.00)
Calcium: 8.1 mg/dL — ABNORMAL LOW (ref 8.9–10.3)
GFR calc Af Amer: >60 mL/min (ref >60)
GFR calc non Af Amer: >60 mL/min (ref >60)
Glucose, Bld: 111 mg/dL — ABNORMAL HIGH (ref 65–99)
POTASSIUM: 3.7 mmol/L (ref 3.5–5.1)
Sodium: 139 mmol/L (ref 135–145)

## 2014-08-11 LAB — CBC
HCT: 29.8 % — ABNORMAL LOW (ref 36.0–46.0)
Hemoglobin: 9.9 g/dL — ABNORMAL LOW (ref 12.0–15.0)
MCH: 29.1 pg (ref 26.0–34.0)
MCHC: 33.2 g/dL (ref 30.0–36.0)
MCV: 87.6 fL (ref 78.0–100.0)
Platelets: 198 10*3/uL (ref 150–400)
RBC: 3.4 MIL/uL — ABNORMAL LOW (ref 3.87–5.11)
RDW: 12.9 % (ref 11.5–15.5)
WBC: 6.4 10*3/uL (ref 4.0–10.5)

## 2014-08-11 MED ORDER — METHOCARBAMOL 500 MG PO TABS: 500.0000 mg | ORAL_TABLET | Freq: Four times a day (QID) | ORAL | Status: DC | PRN

## 2014-08-11 MED ORDER — RIVAROXABAN 10 MG PO TABS: 10.0000 mg | ORAL_TABLET | Freq: Every day | ORAL | Status: DC

## 2014-08-11 MED ORDER — OXYCODONE HCL 5 MG PO TABS: 5.0000 mg | ORAL_TABLET | ORAL | Status: DC | PRN

## 2014-08-11 NOTE — Progress Notes (Signed)
Discharge instruction gave to pt and her husband. All questions answerd. Pt is ready to go.

## 2014-08-11 NOTE — Progress Notes (Signed)
Physical Therapy Treatment Patient Details Name: Wendy [name] MRN: [id number] DOB: [birth year] Today's Date: 08/11/2014    History of Present Illness Pt is a 59 y/o F s/p L TKA.  Pt's PMH includes depression, depression, PONV, and R TKA.    PT Comments    Pt demonstrated ability to ambulate 200 ft and ascend/descend 2 steps this session.  Pt will benefit from continued skilled PT services to increase functional independence and safety.  Patient is making good progress with PT.  From a mobility standpoint anticipate patient will be ready for DC home today.    Follow Up Recommendations  Home health PT;Supervision - Intermittent     Equipment Recommendations  Rolling walker with 5" wheels;3in1 (PT)    Recommendations for Other Services       Precautions / Restrictions Precautions Precautions: Fall;Knee Precaution Comments: Reviewed no pillow under knee Restrictions Weight Bearing Restrictions: Yes LLE Weight Bearing: Partial weight bearing LLE Partial Weight Bearing Percentage or Pounds: 50    Mobility  Bed Mobility Overal bed mobility: Modified Independent Bed Mobility: Supine to Sit;Sit to Supine     Supine to sit: Modified independent (Device/Increase time) Sit to supine: Modified independent (Device/Increase time)   General bed mobility comments: Pt used leg hook technique to get into and OOB.  Min verbal cues for scooting to EOB.    Transfers Overall transfer level: Needs assistance Equipment used: Rolling walker (2 wheeled) Transfers: Sit to/from Stand Sit to Stand: Supervision         General transfer comment: Supervision for safety, does not appear impulsive this session.  Good carryover from last session.  Ambulation/Gait Ambulation/Gait assistance: Supervision Ambulation Distance (Feet): 200 Feet Assistive device: Rolling walker (2 wheeled) Gait Pattern/deviations: Step-to pattern;Step-through pattern;Antalgic;Trunk flexed;Decreased stride  length;Decreased stance time - left;Decreased weight shift to left   Gait velocity interpretation: Below normal speed for age/gender General Gait Details: Pt able to achieve step through gait w/ verbal cues and demonstration.  Dec L knee flexion, improved L foot contact w/ floor during L stance phase.  WB through BUEs to adhere to WB precautions   Stairs Stairs: Yes Stairs assistance: Min guard Stair Management: No rails;Backwards;Step to pattern;With walker Number of Stairs: 2 General stair comments: Demonstration and verbal cues for technique.  Pt's husband assist w/ holding RW.    Wheelchair Mobility    Modified Rankin (Stroke Patients Only)       Balance Overall balance assessment: Needs assistance Sitting-balance support: No upper extremity supported;Feet supported Sitting balance-Leahy Scale: Good     Standing balance support: Bilateral upper extremity supported;During functional activity Standing balance-Leahy Scale: Fair                      Cognition Arousal/Alertness: Awake/alert Behavior During Therapy: WFL for tasks assessed/performed Overall Cognitive Status: Within Functional Limits for tasks assessed                      Exercises Total Joint Exercises Long Arc Quad: AAROM;Left;10 reps;Seated Knee Flexion: AROM;Left;5 reps;Seated Goniometric ROM: 5-108    General Comments        Pertinent Vitals/Pain Pain Assessment: 0-10 Pain Score: 3  Pain Location: L knee Pain Descriptors / Indicators: Aching;Dull;Grimacing;Guarding Pain Intervention(s): Limited activity within patient's tolerance;Monitored during session;Repositioned    Home Living                      Prior Function  PT Goals (current goals can now be found in the care plan section) Acute Rehab PT Goals Patient Stated Goal: to go home Progress towards PT goals: Progressing toward goals    Frequency  7X/week    PT Plan Current plan remains  appropriate    Co-evaluation             End of Session Equipment Utilized During Treatment: Gait belt Activity Tolerance: Patient limited by fatigue Patient left: in bed;in CPM;with call bell/phone within reach;with family/visitor present     Time: [id number] PT Time Calculation (min) (ACUTE ONLY): 34 min  Charges:  $Gait Training: 23-37 mins                    G Codes:      Michail JewelsAshley Parr PT, [place] [phone] Pager: [phone] 08/11/2014, 11:18 AM

## 2014-08-11 NOTE — Discharge Summary (Signed)
Wendy CampbellPeter [name], MD   Wendy CodeBrian Pollie Poma, PA-C [address], [place], [place]  [id number]                             [phone]  PATIENT ID: Wendy [name]        MRN:  [id number]          DOB/AGE: October 16, 1955 / 59 y.o.    DISCHARGE SUMMARY  ADMISSION DATE:    08/09/2014 DISCHARGE DATE:   08/11/2014   ADMISSION DIAGNOSIS: LEFT KNEE OSTEOARTHRITIS    DISCHARGE DIAGNOSIS:  LEFT KNEE OSTEOARTHRITIS    ADDITIONAL DIAGNOSIS: Active Problems:   Primary osteoarthritis of left knee   S/P total knee replacement using cement  Past Medical History  Diagnosis Date  . Depression   . H/O hiatal hernia     had surgery  . Arthritis   . History of blood transfusion   . Chronic constipation   . Depression   . GERD (gastroesophageal reflux disease)   . Urinary incontinence 07/21/2012  . PONV (postoperative nausea and vomiting)     PROCEDURE: Procedure(s): TOTAL KNEE ARTHROPLASTY Left on 08/09/2014  CONSULTS: none     HISTORY: Wendy BondsGloria is a very pleasant [age] year oldwhite female who is seen today for evaluation of her left knee. She is status post right total knee arthroplasty a little over 2 years. She has done extremely welland has essentially no problems with the right knee. [place] and she has been working at the [employer] and has started exhibiting significant pain and discomfort in the left knee. She has been having a lot of popping and clicking in the knee and pain, especially after she sits for long periods of time. She is also having symptoms of giving way of the knee with instability pattern. No recent history of injury or trauma. She is having difficulty now with her activities of daily living and the pain. It will wake her at nighttime also. It is getting to the point where she has pain with every step. She has not gotten to the point where she has had to use a cane, but certainly does on occasion hold  onto objects as she walks. She has already been through corticosteroid injections and Viscosupplementation by [place].  HOSPITAL COURSE:  Wendy [name] is a 59 y.o. admitted on 08/09/2014 and found to have a diagnosis of LEFT KNEE OSTEOARTHRITIS.  After appropriate laboratory studies were obtained  they were taken to the operating room on 08/09/2014 and underwent  Procedure(s): TOTAL KNEE ARTHROPLASTY  Left.   They were given perioperative antibiotics:  Anti-infectives    Start     Dose/Rate Route Frequency Ordered Stop   08/09/14 1415  vancomycin (VANCOCIN) IVPB 1000 mg/200 mL premix     1,000 mg 200 mL/hr over 60 Minutes Intravenous Every 12 hours 08/09/14 1404 08/09/14 1617   08/09/14 0700  vancomycin (VANCOCIN) IVPB 1000 mg/200 mL premix     1,000 mg 200 mL/hr over 60 Minutes Intravenous To Surgery 08/08/14 1331 08/09/14 0735    .  Tolerated the procedure well.  Placed with a foley intraoperatively.    Toradol was given post op.  POD #1, allowed out of bed to a chair.  PT for ambulation and exercise program.  Foley D/C'd in morning.  IV saline locked.  O2 discontionued.  POD #2, continued  PT and ambulation.   Hemovac pulled.  The remainder of the hospital course was dedicated to ambulation and strengthening.   The patient was discharged on 2 Days Post-Op in  Stable condition.  Blood products given:none  DIAGNOSTIC STUDIES: Recent vital signs: Patient Vitals for the past 24 hrs:  BP Temp Temp src Pulse SpO2  08/10/14 1956 136/68 mmHg 98.8 F (37.1 C) Oral 96 95 %       Recent laboratory studies:  Recent Labs  08/08/14 1045 08/10/14 0540 08/11/14 0458  WBC 5.3 6.7 6.4  HGB 13.2 10.3* 9.9*  HCT 40.7 31.2* 29.8*  PLT 243 191 198    Recent Labs  08/08/14 1045 08/10/14 0540 08/11/14 0458  NA 138 136 139  K 4.0 3.4* 3.7  CL 106 102 105  CO2 26 24 27   BUN 10 9 <5*  CREATININE 0.61 0.52 0.49  GLUCOSE 93 110* 111*  CALCIUM 8.9  8.1* 8.1*   Lab Results  Component Value Date   INR 1.00 08/08/2014   INR 0.97 07/05/2014   INR 1.01 03/11/2012     Recent Radiographic Studies :  No results found.  DISCHARGE INSTRUCTIONS: Discharge Instructions    CPM    Complete by:  As directed   Continuous passive motion machine (CPM):      Use the CPM from 0 to 60 degrees for 6-8 hours per day.      You may increase by 5-10 per day.  You may break it up into 2 or 3 sessions per day.      Use CPM for 3-4 weeks or until you are told to stop.     Call MD / Call 911    Complete by:  As directed   If you experience chest pain or shortness of breath, CALL 911 and be transported to the hospital emergency room.  If you develope a fever above 101 F, pus (white drainage) or increased drainage or redness at the wound, or calf pain, call your surgeon's office.     Change dressing    Complete by:  As directed   DO NOT CHANGE YOUR DRESSING.     Constipation Prevention    Complete by:  As directed   Drink plenty of fluids.  Prune juice may be helpful.  You may use a stool softener, such as Colace (over the counter) 100 mg twice a day.  Use MiraLax (over the counter) for constipation as needed.     Diet general    Complete by:  As directed      Discharge instructions    Complete by:  As directed   INSTRUCTIONS AFTER JOINT REPLACEMENT   Remove items at home which could result in a fall. This includes throw rugs or furniture in walking pathways ICE to the affected joint every three hours while awake for 30 minutes at a time, for at least the first 3-5 days, and then as needed for pain and swelling.  Continue to use ice for pain and swelling. You may notice swelling that will progress down to the foot and ankle.  This is normal after surgery.  Elevate your leg when you are not up walking on it.   Continue to use the breathing machine you got in the hospital (incentive spirometer) which will help keep your temperature down.  It is common for  your temperature to cycle up and down following surgery, especially at night when you are not up moving around and exerting yourself.  The breathing machine keeps your lungs expanded and your temperature down.   DIET:  As you were doing prior to hospitalization, we recommend a well-balanced diet.  DRESSING / WOUND CARE / SHOWERING  Keep the surgical dressing until follow up.  The dressing is water proof, so you can shower without any extra covering.  IF THE DRESSING FALLS OFF or the wound gets wet inside, change the dressing with sterile gauze.  Please use good hand washing techniques before changing the dressing.  Do not use any lotions or creams on the incision until instructed by your surgeon.    ACTIVITY  Increase activity slowly as tolerated, but follow the weight bearing instructions below.   No driving for 6 weeks or until further direction given by your physician.  You cannot drive while taking narcotics.  No lifting or carrying greater than 10 lbs. until further directed by your surgeon. Avoid periods of inactivity such as sitting longer than an hour when not asleep. This helps prevent blood clots.  You may return to work once you are authorized by your doctor.     WEIGHT BEARING   Partial weight bearing with assist device as directed.     EXERCISES  Results after joint replacement surgery are often greatly improved when you follow the exercise, range of motion and muscle strengthening exercises prescribed by your doctor. Safety measures are also important to protect the joint from further injury. Any time any of these exercises cause you to have increased pain or swelling, decrease what you are doing until you are comfortable again and then slowly increase them. If you have problems or questions, call your caregiver or physical therapist for advice.   Rehabilitation is important following a joint replacement. After just a few days of immobilization, the muscles of the leg can  become weakened and shrink (atrophy).  These exercises are designed to build up the tone and strength of the thigh and leg muscles and to improve motion. Often times heat used for twenty to thirty minutes before working out will loosen up your tissues and help with improving the range of motion but do not use heat for the first two weeks following surgery (sometimes heat can increase post-operative swelling).   These exercises can be done on a table or on a bed. Use whatever works the best and is most comfortable for you.    Use music or television while you are exercising so that the exercises are a pleasant break in your day. This will make your life better with the exercises acting as a break in your routine that you can look forward to.   Perform all exercises about fifteen times, three times per day or as directed.  You should exercise both the operative leg and the other leg as well.   Exercises include:   Quad Sets - Tighten up the muscle on the front of the thigh (Quad) and hold for 5-10 seconds.   Straight Leg Raises - With your knee straight (if you were given a brace, keep it on), lift the leg to 60 degrees, hold for 3 seconds, and slowly lower the leg.  Perform this exercise against resistance later as your leg gets stronger.  Leg Slides: Lying on your back, slowly slide your foot toward your buttocks, bending your knee up off the floor (only go as far as is comfortable). Then slowly slide your foot back down until your leg is flat on the floor again.  Angel Wings: Lying on  your back spread your legs to the side as far apart as you can without causing discomfort.  Hamstring Strength:  Lying on your back, push your heel against the floor with your leg straight by tightening up the muscles of your buttocks.  Repeat, but this time bend your knee to a comfortable angle, and push your heel against the floor.  You may put a pillow under the heel to make it more comfortable if necessary.   A  rehabilitation program following joint replacement surgery can speed recovery and prevent re-injury in the future due to weakened muscles. Contact your doctor or a physical therapist for more information on knee rehabilitation.    CONSTIPATION  Constipation is defined medically as fewer than three stools per week and severe constipation as less than one stool per week.  Even if you have a regular bowel pattern at home, your normal regimen is likely to be disrupted due to multiple reasons following surgery.  Combination of anesthesia, postoperative narcotics, change in appetite and fluid intake all can affect your bowels.   YOU MUST use at least one of the following options; they are listed in order of increasing strength to get the job done.  They are all available over the counter, and you may need to use some, POSSIBLY even all of these options:    Drink plenty of fluids (prune juice may be helpful) and high fiber foods Colace 100 mg by mouth twice a day  Senokot for constipation as directed and as needed Dulcolax (bisacodyl), take with full glass of water  Miralax (polyethylene glycol) once or twice a day as needed.  If you have tried all these things and are unable to have a bowel movement in the first 3-4 days after surgery call either your surgeon or your primary doctor.    If you experience loose stools or diarrhea, hold the medications until you stool forms back up.  If your symptoms do not get better within 1 week or if they get worse, check with your doctor.  If you experience "the worst abdominal pain ever" or develop nausea or vomiting, please contact the office immediately for further recommendations for treatment.   ITCHING:  If you experience itching with your medications, try taking only a single pain pill, or even half a pain pill at a time.  You can also use Benadryl over the counter for itching or also to help with sleep.   TED HOSE STOCKINGS:  Use stockings on both legs until  for at least 2 weeks or as directed by physician office. They may be removed at night for sleeping.  MEDICATIONS:  See your medication summary on the "After Visit Summary" that nursing will review with you.  You may have some home medications which will be placed on hold until you complete the course of blood thinner medication.  It is important for you to complete the blood thinner medication as prescribed.  PRECAUTIONS:  If you experience chest pain or shortness of breath - call 911 immediately for transfer to the hospital emergency department.   If you develop a fever greater that 101 F, purulent drainage from wound, increased redness or drainage from wound, foul odor from the wound/dressing, or calf pain - CONTACT YOUR SURGEON.  FOLLOW-UP APPOINTMENTS:  If you do not already have a post-op appointment, please call the office for an appointment to be seen by your surgeon.  Guidelines for how soon to be seen are listed in your "After Visit Summary", but are typically between 1-4 weeks after surgery.  OTHER INSTRUCTIONS:   Knee Replacement:  Do not place pillow under knee, focus on keeping the knee straight while resting. CPM instructions: 0-90 degrees, 2 hours in the morning, 2 hours in the afternoon, and 2 hours in the evening. Place foam block, curve side up under heel at all times except when in CPM or when walking.  DO NOT modify, tear, cut, or change the foam block in any way.  MAKE SURE YOU:  Understand these instructions.  Get help right away if you are not doing well or get worse.    Thank you for letting us be a part of your medical care team.  It is a privilege we respect greatly.  We hope these instructions will help you stay on track for a fast and full recovery!     Do not put a pillow under the knee. Place it under the heel.    Complete by:  As directed      Driving restrictions    Complete by:  As directed   No driving for 6  weeks     Increase activity slowly as tolerated    Complete by:  As directed      Lifting restrictions    Complete by:  As directed   No lifting for 6 weeks     Partial weight bearing    Complete by:  As directed   % Body Weight:  50%  Laterality:  left  Extremity:  Lower     Patient may shower    Complete by:  As directed   You may shower over your dressing     TED hose    Complete by:  As directed   Use stockings (TED hose) for 3 weeks on left  leg.  You may remove them at night for sleeping.           DISCHARGE MEDICATIONS:     Medication List    STOP taking these medications        diclofenac sodium 1 % Gel  Commonly known as:  VOLTAREN     ibuprofen 200 MG tablet  Commonly known as:  ADVIL,MOTRIN     mirabegron ER 25 MG Tb24 tablet  Commonly known as:  MYRBETRIQ      TAKE these medications        cycloSPORINE 0.05 % ophthalmic emulsion  Commonly known as:  RESTASIS  Place 1 drop into both eyes 2 (two) times daily.     DULoxetine 60 MG capsule  Commonly known as:  CYMBALTA  Take 120 mg by mouth daily.     methocarbamol 500 MG tablet  Commonly known as:  ROBAXIN  Take 1 tablet (500 mg total) by mouth every 6 (six) hours as needed for muscle spasms.     multivitamins ther. w/minerals Tabs tablet  Take 1 tablet by mouth daily.     NEXIUM 40 MG packet  Generic drug:  esomeprazole  MIX 1 PACKET WITH 15 ML OF WATER EVERY DAY BEFORE BREAKFAST     oxyCODONE 5 MG immediate release tablet  Commonly known as:  Oxy IR/ROXICODONE  Take 1-2 tablets (5-10 mg total) by mouth every 4 (four) hours as needed for breakthrough  pain.     polyvinyl alcohol-povidone 1.4-0.6 % ophthalmic solution  Commonly known as:  HYPOTEARS  Place 1-2 drops into both eyes as needed (dry eyes).     rivaroxaban 10 MG Tabs tablet  Commonly known as:  XARELTO  Take 1 tablet (10 mg total) by mouth daily with breakfast.     traZODone 100 MG tablet  Commonly known as:  DESYREL  Take  150 mg by mouth at bedtime.        FOLLOW UP VISIT:       Follow-up Information    Follow up with [facility], Claude Manges, MD. Schedule an appointment as soon as possible for a visit on 08/22/2014.   Specialty:  Orthopedic Surgery   Contact information:   [address] [place] [address] [phone]       DISPOSITION:   Home  CONDITION:  Stable   Oris Drone. Aleda Grana [facility] [phone]  08/11/2014 3:54 PM

## 2014-08-11 NOTE — Progress Notes (Signed)
Physical Therapy Treatment Patient Details Name: Wendy KaysGloria J Reim MRN: [id number] DOB: 15-Feb-1956 Today's Date: 08/11/2014    History of Present Illness Pt is a 59 y/o F s/p L TKA.  Pt's PMH includes depression, depression, PONV, and R TKA.    PT Comments    Patient is making good progress with PT.  From a mobility standpoint anticipate patient will be ready for DC home today.  Pt will benefit from continued skilled PT services to increase functional independence and safety.   Follow Up Recommendations  Home health PT;Supervision - Intermittent     Equipment Recommendations  Rolling walker with 5" wheels;3in1 (PT)    Recommendations for Other Services       Precautions / Restrictions Precautions Precautions: Fall;Knee Precaution Comments: Reviewed no pillow under knee Restrictions Weight Bearing Restrictions: Yes LLE Weight Bearing: Partial weight bearing LLE Partial Weight Bearing Percentage or Pounds: 50    Mobility  Bed Mobility Overal bed mobility: Modified Independent Bed Mobility: Supine to Sit;Sit to Supine     Supine to sit: Modified independent (Device/Increase time) Sit to supine: Modified independent (Device/Increase time)   General bed mobility comments: in recliner  Transfers Overall transfer level: Needs assistance Equipment used: Rolling walker (2 wheeled) Transfers: Sit to/from Stand Sit to Stand: Supervision         General transfer comment: supervision for safety.  Ambulation/Gait Ambulation/Gait assistance: Supervision Ambulation Distance (Feet): 300 Feet Assistive device: Rolling walker (2 wheeled) Gait Pattern/deviations: Step-through pattern;Decreased stride length;Decreased stance time - left;Decreased weight shift to left;Antalgic   Gait velocity interpretation: Below normal speed for age/gender General Gait Details: Dec L knee flexion and extension during swing phase and stance phase respectively.     Stairs             Wheelchair Mobility    Modified Rankin (Stroke Patients Only)       Balance Overall balance assessment: Needs assistance Sitting-balance support: No upper extremity supported;Feet supported Sitting balance-Leahy Scale: Good     Standing balance support: Bilateral upper extremity supported;During functional activity Standing balance-Leahy Scale: Fair                      Cognition Arousal/Alertness: Awake/alert Behavior During Therapy: WFL for tasks assessed/performed Overall Cognitive Status: Within Functional Limits for tasks assessed                      Exercises Total Joint Exercises Hip ABduction/ADduction: AROM;Left;10 reps;Standing Knee Flexion: AROM;Left;10 reps;Standing;Limitations Knee Flexion Limitations: Pt w/ compensatory L hip E 2/2 limited L knee flexion    General Comments        Pertinent Vitals/Pain Pain Assessment: 0-10 Pain Score: 4  Pain Location: L knee Pain Descriptors / Indicators: Aching;Nagging;Grimacing Pain Intervention(s): Limited activity within patient's tolerance;Monitored during session;Repositioned    Home Living                      Prior Function            PT Goals (current goals can now be found in the care plan section) Acute Rehab PT Goals Patient Stated Goal: to go home Progress towards PT goals: Progressing toward goals    Frequency  7X/week    PT Plan Current plan remains appropriate    Co-evaluation             End of Session Equipment Utilized During Treatment: Gait belt Activity Tolerance: Patient limited by fatigue Patient  left: in chair;with call bell/phone within reach;with family/visitor present     Time: 1419-1440 PT Time Calculation (min) (ACUTE ONLY): 21 min  Charges:  $Gait Training: 8-22 mins                    G Codes:      Michail JewelsAshley Parr PT, [place] [phone] Pager: [phone] 08/11/2014, 3:49 PM

## 2014-08-11 NOTE — Progress Notes (Signed)
Occupational Therapy Treatment Patient Details Name: Wendy [name] MRN: [id number] DOB: [birth year] Today's Date: 08/11/2014    History of present illness Pt is a 59 y/o F s/p L TKA.  Pt's PMH includes depression, depression, PONV, and R TKA.   OT comments  Pt ready to DC from OT standpoint  Follow Up Recommendations  No OT follow up    Equipment Recommendations  None recommended by OT    Recommendations for Other Services      Precautions / Restrictions Precautions Precautions: Fall;Knee Precaution Comments: Reviewed no pillow under knee Restrictions Weight Bearing Restrictions: Yes LLE Weight Bearing: Partial weight bearing LLE Partial Weight Bearing Percentage or Pounds: 50       Mobility Bed Mobility Overal bed mobility: Modified Independent Bed Mobility: Supine to Sit;Sit to Supine     Supine to sit: Modified independent (Device/Increase time) Sit to supine: Modified independent (Device/Increase time)   General bed mobility comments: Pt used leg hook technique to get into and OOB.  Min verbal cues for scooting to EOB.    Transfers Overall transfer level: Needs assistance Equipment used: Rolling walker (2 wheeled) Transfers: Sit to/from Stand Sit to Stand: Supervision         General transfer comment: Supervision for safety, does not appear impulsive this session.  Good carryover from last session.    Balance Overall balance assessment: Needs assistance Sitting-balance support: No upper extremity supported;Feet supported Sitting balance-Leahy Scale: Good     Standing balance support: Bilateral upper extremity supported;During functional activity Standing balance-Leahy Scale: Fair                     ADL                       Lower Body Dressing: Minimal assistance;Sit to/from stand   Toilet Transfer: Firefighterupervision/safety;RW Toilet Transfer Details (indicate cue type and reason): chair to bed Toileting- Clothing Manipulation and  Hygiene: Supervision/safety;Sit to/from stand       Functional mobility during ADLs: Min guard General ADL Comments: pt hoping to go home this day.       Vision                     Perception     Praxis      Cognition   Behavior During Therapy: [facility]FL for tasks assessed/performed Overall Cognitive Status: Within Functional Limits for tasks assessed                       Extremity/Trunk Assessment               Exercises Total Joint Exercises Long Arc Quad: AAROM;Left;10 reps;Seated Knee Flexion: AROM;Left;5 reps;Seated Goniometric ROM: 5-108   Shoulder Instructions       General Comments      Pertinent Vitals/ Pain       Pain Assessment: 0-10 Pain Score: 3  Pain Location: l knee Pain Descriptors / Indicators: Sore Pain Intervention(s): Limited activity within patient's tolerance;Monitored during session;Repositioned  Home Living                                          Prior Functioning/Environment              Frequency       Progress Toward Goals  OT Goals(current goals can now  be found in the care plan section)  Progress towards OT goals: Progressing toward goals  Acute Rehab OT Goals Patient Stated Goal: to go home  Plan Discharge plan remains appropriate    Co-evaluation                 End of Session Equipment Utilized During Treatment: Rolling walker CPM Left Knee CPM Left Knee: On Left Knee Flexion (Degrees): 70 Left Knee Extension (Degrees): 0   Activity Tolerance Patient tolerated treatment well   Patient Left in bed;with call bell/phone within reach;with family/visitor present   Nurse Communication Mobility status        Time: [id number] OT Time Calculation (min): 10 min  Charges: OT General Charges $OT Visit: 1 Procedure OT Treatments $Self Care/Home Management : 8-22 mins  Valeri Sula, Metro KungLorraine D 08/11/2014, 12:42 PM

## 2014-08-12 LAB — TYPE AND SCREEN
ABO/RH(D): A POS
ANTIBODY SCREEN: POSITIVE
DAT, IgG: NEGATIVE
UNIT DIVISION: 0
Unit division: 0

## 2014-08-12 NOTE — Care Management Note (Signed)
Case Management Note  Patient Details  Name: Wendy [name] MRN: [id number] Date of Birth: [birth year]  Subjective/Objective:       Patient underwent a left total knee arthroplasty.            Action/Plan:    Case manager contacted patient at home. (She was discharged prior to CM see her). Choice was offered. Referral was called to [facility], [facility]iaison. Patient will be staying in [place]: [address], [place], [place] [address]  Phone: [phone]/  Expected Discharge Date: 08/11/14                 Expected Discharge Plan: Home with Home Health    In-House Referral:  NA  Discharge planning Services  CM Consult  Post Acute Care Choice:  Home Health Choice offered to:  Patient  DME Arranged:    DME Agency:  [employer]  HH Arranged:  PT HH Agency:  [facility]  Status of Service:  Completed, signed off  Medicare Important Message Given:    Date Medicare IM Given:    Medicare IM give by:    Date Additional Medicare IM Given:    Additional Medicare Important Message give by:     If discussed at Long Length of Stay Meetings, dates discussed:    Additional Comments:  Durenda GuthrieBrady, Chauntay Paszkiewicz Naomi, RN 08/12/2014, 10:05 AM

## 2014-08-15 ENCOUNTER — Ambulatory Visit (HOSPITAL_COMMUNITY)
Admission: RE | Admit: 2014-08-15 | Discharge: 2014-08-15 | Disposition: A | Discharge status: Home / Self Care | Payer: BC Managed Care – PPO | Source: Ambulatory Visit | Attending: Orthopaedic Surgery | Admitting: Orthopaedic Surgery

## 2014-08-15 ENCOUNTER — Other Ambulatory Visit (HOSPITAL_COMMUNITY): Payer: Self-pay | Admitting: Orthopaedic Surgery

## 2014-08-15 DIAGNOSIS — M7989 Other specified soft tissue disorders: Secondary | ICD-10-CM | POA: Insufficient documentation

## 2014-08-15 DIAGNOSIS — R609 Edema, unspecified: Secondary | ICD-10-CM

## 2014-08-15 DIAGNOSIS — M79605 Pain in left leg: Secondary | ICD-10-CM | POA: Insufficient documentation

## 2014-08-15 DIAGNOSIS — I82402 Acute embolism and thrombosis of unspecified deep veins of left lower extremity: Secondary | ICD-10-CM

## 2014-08-27 ENCOUNTER — Other Ambulatory Visit: Payer: Self-pay | Admitting: Adult Health

## 2015-02-14 ENCOUNTER — Other Ambulatory Visit: Payer: Self-pay

## 2015-02-14 DIAGNOSIS — Z1231 Encounter for screening mammogram for malignant neoplasm of breast: Secondary | ICD-10-CM

## 2015-04-28 ENCOUNTER — Ambulatory Visit: Payer: BC Managed Care – PPO

## 2015-07-17 ENCOUNTER — Ambulatory Visit: Payer: Managed Care, Other (non HMO) | Admitting: Adult Health

## 2015-07-17 ENCOUNTER — Encounter: Payer: Self-pay | Admitting: Adult Health

## 2015-09-07 ENCOUNTER — Other Ambulatory Visit: Payer: Self-pay | Admitting: Obstetrics and Gynecology

## 2015-09-07 DIAGNOSIS — Z1231 Encounter for screening mammogram for malignant neoplasm of breast: Secondary | ICD-10-CM

## 2015-09-08 ENCOUNTER — Ambulatory Visit
Admission: RE | Admit: 2015-09-08 | Discharge: 2015-09-08 | Disposition: A | Discharge status: Home / Self Care | Payer: BC Managed Care – PPO | Source: Ambulatory Visit | Attending: Obstetrics and Gynecology | Admitting: Obstetrics and Gynecology

## 2015-09-08 DIAGNOSIS — Z1231 Encounter for screening mammogram for malignant neoplasm of breast: Secondary | ICD-10-CM

## 2015-09-11 ENCOUNTER — Encounter (HOSPITAL_BASED_OUTPATIENT_CLINIC_OR_DEPARTMENT_OTHER): Payer: Self-pay

## 2015-09-11 ENCOUNTER — Emergency Department (HOSPITAL_BASED_OUTPATIENT_CLINIC_OR_DEPARTMENT_OTHER)
Admission: EM | Admit: 2015-09-11 | Discharge: 2015-09-11 | Disposition: A | Discharge status: Home / Self Care | Payer: BC Managed Care – PPO | Attending: Emergency Medicine | Admitting: Emergency Medicine

## 2015-09-11 DIAGNOSIS — S81851A Open bite, right lower leg, initial encounter: Secondary | ICD-10-CM | POA: Diagnosis present

## 2015-09-11 DIAGNOSIS — Y939 Activity, unspecified: Secondary | ICD-10-CM | POA: Insufficient documentation

## 2015-09-11 DIAGNOSIS — Y999 Unspecified external cause status: Secondary | ICD-10-CM | POA: Insufficient documentation

## 2015-09-11 DIAGNOSIS — Y929 Unspecified place or not applicable: Secondary | ICD-10-CM | POA: Diagnosis not present

## 2015-09-11 DIAGNOSIS — M1990 Unspecified osteoarthritis, unspecified site: Secondary | ICD-10-CM | POA: Diagnosis not present

## 2015-09-11 DIAGNOSIS — F329 Major depressive disorder, single episode, unspecified: Secondary | ICD-10-CM | POA: Diagnosis not present

## 2015-09-11 DIAGNOSIS — W540XXA Bitten by dog, initial encounter: Secondary | ICD-10-CM | POA: Diagnosis not present

## 2015-09-11 MED ORDER — DOXYCYCLINE HYCLATE 100 MG PO CAPS: 100.0000 mg | ORAL_CAPSULE | Freq: Two times a day (BID) | ORAL | Status: DC

## 2015-09-11 MED ORDER — METRONIDAZOLE 500 MG PO TABS: 500.0000 mg | ORAL_TABLET | Freq: Three times a day (TID) | ORAL | Status: DC

## 2015-09-11 MED ORDER — TETANUS-DIPHTH-ACELL PERTUSSIS 5-2.5-18.5 LF-MCG/0.5 IM SUSP
0.5000 mL | Freq: Once | INTRAMUSCULAR | Status: AC
  Administered 2015-09-11: 0.5 mL via INTRAMUSCULAR
  Filled 2015-09-11: qty 0.5

## 2015-09-11 NOTE — Discharge Instructions (Signed)

## 2015-09-11 NOTE — ED Notes (Signed)
C/o dog bite to right posterior calf-puncture wounds noted-pt's own dog-UTD on vaccines-NAD-steady gait

## 2015-09-11 NOTE — ED Provider Notes (Signed)
CSN: [id number]     Arrival date & time 09/11/15  1816 History   First MD Initiated Contact with Patient 09/11/15 1834     Chief Complaint  Patient presents with  . Animal Bite   Patient is a 60 y.o. female presenting with animal bite.  Animal Bite Contact animal:  Dog Location:  Leg Leg injury location:  R lower leg Pain details:    Severity:  No pain Incident location:  Home Provoked: provoked   Animal's rabies vaccination status:  Up to date Animal in possession: yes   Tetanus status:  Unknown  60 year old female presenting with a dog bite to the right posterior calf. She was feeding her to dogs when one attempted to eat the food of the other. They began to fight and she attempted to get in between them to stop them. One of the dogs bit down onto her right calf. She states that he immediately let go. She is complaining of one laceration with associated swelling and bruising to the calf. Bleeding controlled at this time without wound dressing. The dogs are in her possession and she has already been in contact with animal control services about monitoring her behavior. They are up-to-date on their rabies vaccine. She is unsure of her tetanus status. She denies pain at this time. She has no other complaints today.  Past Medical History  Diagnosis Date  . Depression   . H/O hiatal hernia     had surgery  . Arthritis   . History of blood transfusion   . Chronic constipation   . Depression   . GERD (gastroesophageal reflux disease)   . Urinary incontinence 07/21/2012  . PONV (postoperative nausea and vomiting)    Past Surgical History  Procedure Laterality Date  . Abdominal hysterectomy    . Hernia repair    . Cholecystectomy    . Appendectomy  1982  . Total knee arthroplasty  03/17/2012    Procedure: TOTAL KNEE ARTHROPLASTY;  Surgeon: Valeria Batman, MD;  Location: [facility] OR;  Service: Orthopedics;  Laterality: Right;  RIGHT TOTAL ARTHROPLASTY  . Cesarean section    . Total  knee arthroplasty Left 08/09/2014    Procedure: TOTAL KNEE ARTHROPLASTY;  Surgeon: Valeria Batman, MD;  Location: [facility] OR;  Service: Orthopedics;  Laterality: Left;   Family History  Problem Relation Age of Onset  . CAD Other   . Cancer Other   . Diabetes Other   . Cancer Sister     lung   Social History  Substance Use Topics  . Smoking status: Never Smoker   . Smokeless tobacco: Never Used  . Alcohol Use: Yes     Comment: occ   OB History    Gravida Para Term Preterm AB TAB SAB Ectopic Multiple Living   Review of Systems  All other systems reviewed and are negative.     Allergies  Penicillins and Sulfa antibiotics  Home Medications   Prior to Admission medications   Medication Sig Start Date End Date Taking? Authorizing Provider  cycloSPORINE (RESTASIS) 0.05 % ophthalmic emulsion Place 1 drop into both eyes 2 (two) times daily.    Historical Provider, MD  doxycycline (VIBRAMYCIN) 100 MG capsule Take 1 capsule (100 mg total) by mouth 2 (two) times daily. 09/11/15   Jasai Sorg, PA-C  DULoxetine (CYMBALTA) 60 MG capsule Take 120 mg by mouth daily.    Historical Provider,  MD  metroNIDAZOLE (FLAGYL) 500 MG tablet Take 1 tablet (500 mg total) by mouth 3 (three) times daily. 09/11/15   Dorance Spink, PA-C  Multiple Vitamins-Minerals (MULTIVITAMINS THER. W/MINERALS) TABS Take 1 tablet by mouth daily.    Historical Provider, MD  traZODone (DESYREL) 100 MG tablet Take 150 mg by mouth at bedtime.     Historical Provider, MD   BP 131/81 mmHg  Pulse 66  Temp(Src) 98.4 F (36.9 C) (Oral)  Resp 18  Ht 5\' 6"  (1.676 m)  Wt 78.019 kg  BMI 27.77 kg/m2  SpO2 98% Physical Exam  Constitutional: She appears well-developed and well-nourished. No distress.  HENT:  Head: Normocephalic and atraumatic.  Right Ear: External ear normal.  Left Ear: External ear normal.  Eyes: Conjunctivae are normal. Right eye exhibits no discharge. Left eye exhibits no discharge. No  scleral icterus.  Neck: Normal range of motion.  Cardiovascular: Normal rate and intact distal pulses.   Pulmonary/Chest: Effort normal.  Musculoskeletal: Normal range of motion.  Mild tenderness to palpation of the right posterior calf at the site of dog bite. Full range of motion of the knee and ankle intact. No swelling or deformity. Patient ambulates with a steady gait.  Neurological: She is alert. Coordination normal.  Skin: Skin is warm and dry.  Ecchymosis and swelling noted to the right posterior calf approximately 6 cm. One 1.5 cm linear laceration noted to the right posterior calf. Wound irrigated and base explored. Wound is superficial with a depth of less than 5 mm with no foreign bodies. Bleeding controlled at this time.  Psychiatric: She has a normal mood and affect. Her behavior is normal.  Nursing note and vitals reviewed.   ED Course  Procedures (including critical care time) Labs Review Labs Reviewed - No data to display  Imaging Review No results found. I have personally reviewed and evaluated these images and lab results as part of my medical decision-making.   EKG Interpretation None      MDM   Final diagnoses:  Dog bite   Wendy Reeves presents with a dog bite to the right calf.  Pt wounds irrigated with sterile saline and wound examined in a bloodless field with visualization of the base and no foreign bodies were seen. Wound is superficial with length of 1.5 cm depth of less than 5 mm. Patient's tetanus updated. The animal is in the possession of the patient and she has already been in touch with her local animal control. Rabies vaccine and immunoglobulin not indicated at this time pending observation of the animal. Wounds not closed secondary to concern for infection. We'll discharge home with pain medication, doxycycline and flagyl given PCN allergy and requests for close follow-up with PCP, urgent care or ED in two days for a wound check. Discussed signs of  infection and other reasons to return to the ED sooner.      Rolm GalaStevi Shenelle Klas, PA-C 09/12/15 [id number]  Alvira MondayErin Schlossman, MD 09/13/15 1702

## 2016-04-05 ENCOUNTER — Other Ambulatory Visit (INDEPENDENT_AMBULATORY_CARE_PROVIDER_SITE_OTHER): Payer: Self-pay | Admitting: Orthopaedic Surgery

## 2016-04-10 ENCOUNTER — Telehealth (INDEPENDENT_AMBULATORY_CARE_PROVIDER_SITE_OTHER): Payer: Self-pay | Admitting: Orthopaedic Surgery

## 2016-04-10 NOTE — Telephone Encounter (Signed)
Patient is requesting Wendy Reeves to please call her about the symptoms and pain she is experiencing in her knee.

## 2016-04-11 NOTE — Telephone Encounter (Signed)
Please call.

## 2016-04-12 ENCOUNTER — Other Ambulatory Visit (INDEPENDENT_AMBULATORY_CARE_PROVIDER_SITE_OTHER): Payer: Self-pay

## 2016-04-12 ENCOUNTER — Telehealth (INDEPENDENT_AMBULATORY_CARE_PROVIDER_SITE_OTHER): Payer: Self-pay | Admitting: Orthopaedic Surgery

## 2016-04-12 MED ORDER — IBUPROFEN 800 MG PO TABS: ORAL_TABLET | ORAL | 1 refills | Status: DC

## 2016-04-12 NOTE — Telephone Encounter (Signed)
[facility] called about a refill request for ibuprofen. Please call pharmacy [phone]

## 2016-04-12 NOTE — Telephone Encounter (Signed)
Call in 

## 2016-04-12 NOTE — Telephone Encounter (Signed)
Ok to call in

## 2016-04-12 NOTE — Telephone Encounter (Signed)
done

## 2016-04-15 ENCOUNTER — Ambulatory Visit (INDEPENDENT_AMBULATORY_CARE_PROVIDER_SITE_OTHER): Payer: Self-pay

## 2016-04-15 ENCOUNTER — Encounter (INDEPENDENT_AMBULATORY_CARE_PROVIDER_SITE_OTHER): Payer: Self-pay | Admitting: Orthopaedic Surgery

## 2016-04-15 ENCOUNTER — Ambulatory Visit (INDEPENDENT_AMBULATORY_CARE_PROVIDER_SITE_OTHER): Payer: BC Managed Care – PPO | Admitting: Orthopaedic Surgery

## 2016-04-15 VITALS — BP 124/74 | HR 62 | Ht 66.0 in | Wt 174.2 lb

## 2016-04-15 DIAGNOSIS — G8929 Other chronic pain: Secondary | ICD-10-CM | POA: Diagnosis not present

## 2016-04-15 DIAGNOSIS — M25562 Pain in left knee: Secondary | ICD-10-CM | POA: Diagnosis not present

## 2016-04-15 NOTE — Progress Notes (Signed)
Office Visit Note   Patient: Wendy Reeves           Date of Birth: 05-04-1955           MRN: [id number] Visit Date: 04/15/2016              Requested by: Kari Baars, MD [address] [place], [place] [address] PCP: Fredirick Maudlin, MD   Assessment & Plan: Visit Diagnoses: Left total knee replacement without obvious complicating factors. Patient has sensation of occasional "slipping" without objective findings Long discussion regarding exercises and particularly those referable to the quadriceps and hamstring of both lower extremities. I do not think the knee is infected nor loosened area and ligaments are perfectly stable and she does not walk with a limp. Plan to see her back on an as-needed basis.    Orders:  No orders of the defined types were placed in this encounter.  No orders of the defined types were placed in this encounter.     Procedures: No procedures performed   Clinical Data: No additional findings.   Subjective: Chief Complaint  Patient presents with  . Left Knee - Pain, Weakness    Pt here today for left knee pain. Pt had left TKA in May 2016.  She states the left knee has started "giving way" and has weakness and pain.  She has not fallen but has caught herself  Shanyia is 2 years status post left total knee replacement. She had a significant intra-articular bleed from this overall toe and developed a stiff knee in both flexion and extension. Manipulation helped minimally within approximately 6 weeks. In May of last year I performed an arthroscopic debridement. There was considerable scar within the gutters and superior pouch. That procedure seemed to help the point where she was having less difficulty ambulating. Have not seen her since as she [place] in [place]. She relates that she has an occasional feeling of something "slipping". She denies any fever or chills or the knee swelling. She has not had any injury or trauma.  Review  of Systems   Objective: Vital Signs: Ht 5\' 6"  (1.676 m)   Wt 174 lb 2.6 oz (79 kg)   BMI 28.11 kg/m   Physical Exam  Ortho Exam left knee lacked approximately 10 to full extension. There was no opening with a varus or valgus stress. Negative anterior drawer sign. The knee was not effused nor warm or hot.No Calf pain. Neurovascular exam intact. No swelling distally.  Specialty Comments:  No specialty comments available.  Imaging: No results found.   PMFS History: Patient Active Problem List   Diagnosis Date Noted  . Primary osteoarthritis of left knee 08/09/2014  . S/P total knee replacement using cement 08/09/2014  . Urinary incontinence 07/21/2012  . Reflux 07/21/2012  . Osteoarthritis of knee 03/17/2012  . Pain in joint, lower leg 07/15/2011  . Stiffness of joint, not elsewhere classified, lower leg 07/15/2011  . Muscle weakness (generalized) 07/15/2011  . Difficulty in walking(719.7) 07/15/2011   Past Medical History:  Diagnosis Date  . Arthritis   . Chronic constipation   . Depression   . Depression   . GERD (gastroesophageal reflux disease)   . H/O hiatal hernia    had surgery  . History of blood transfusion   . PONV (postoperative nausea and vomiting)   . Urinary incontinence 07/21/2012    Family History  Problem Relation Age of Onset  . CAD Other   .  Cancer Other   . Diabetes Other   . Cancer Sister     lung    Past Surgical History:  Procedure Laterality Date  . ABDOMINAL HYSTERECTOMY    . APPENDECTOMY  1982  . CESAREAN SECTION    . CHOLECYSTECTOMY    . HERNIA REPAIR    . TOTAL KNEE ARTHROPLASTY  03/17/2012   Procedure: TOTAL KNEE ARTHROPLASTY;  Surgeon: Valeria BatmanPeter W Duron Meister, MD;  [facility];  Service: Orthopedics;  Laterality: Right;  RIGHT TOTAL ARTHROPLASTY  . TOTAL KNEE ARTHROPLASTY Left 08/09/2014   Procedure: TOTAL KNEE ARTHROPLASTY;  Surgeon: Valeria BatmanPeter W Kotaro Buer, MD;  [facility];  Service: Orthopedics;  Laterality: Left;   Social  History   Occupational History  . Not on file.   Social History Main Topics  . Smoking status: Never Smoker  . Smokeless tobacco: Never Used  . Alcohol use Yes     Comment: occ  . Drug use: No  . Sexual activity: Yes    Birth control/ protection: Surgical

## 2016-04-24 NOTE — Telephone Encounter (Signed)
Called.  Already seen by Dr Cleophas DunkerWhitfield

## 2016-06-07 ENCOUNTER — Other Ambulatory Visit (INDEPENDENT_AMBULATORY_CARE_PROVIDER_SITE_OTHER): Payer: Self-pay | Admitting: Orthopaedic Surgery

## 2016-06-13 NOTE — Telephone Encounter (Signed)
Patient called in regards to her [facility] [place].  She states that the pharmacy does not have it.  CB#[phone].  Thank you.

## 2016-06-17 ENCOUNTER — Telehealth (INDEPENDENT_AMBULATORY_CARE_PROVIDER_SITE_OTHER): Payer: Self-pay | Admitting: Orthopaedic Surgery

## 2016-06-17 NOTE — Telephone Encounter (Signed)
Patient called stating that the pharmacy told her to call our office to get her Ibuprofen refilled.  CB#[phone].

## 2016-06-17 NOTE — Telephone Encounter (Signed)
OK 

## 2016-06-18 NOTE — Telephone Encounter (Signed)
ok 

## 2016-06-19 ENCOUNTER — Other Ambulatory Visit (INDEPENDENT_AMBULATORY_CARE_PROVIDER_SITE_OTHER): Payer: Self-pay

## 2016-06-19 MED ORDER — IBUPROFEN 800 MG PO TABS: ORAL_TABLET | ORAL | 1 refills | Status: DC

## 2016-06-19 NOTE — Telephone Encounter (Signed)
done

## 2016-12-16 ENCOUNTER — Other Ambulatory Visit: Payer: Self-pay | Admitting: Obstetrics and Gynecology

## 2016-12-16 DIAGNOSIS — Z1231 Encounter for screening mammogram for malignant neoplasm of breast: Secondary | ICD-10-CM

## 2016-12-17 ENCOUNTER — Ambulatory Visit
Admission: RE | Admit: 2016-12-17 | Discharge: 2016-12-17 | Disposition: A | Discharge status: Home / Self Care | Payer: BC Managed Care – PPO | Source: Ambulatory Visit | Attending: Obstetrics and Gynecology | Admitting: Obstetrics and Gynecology

## 2016-12-17 DIAGNOSIS — Z1231 Encounter for screening mammogram for malignant neoplasm of breast: Secondary | ICD-10-CM

## 2016-12-18 ENCOUNTER — Telehealth: Payer: Self-pay | Admitting: *Deleted

## 2016-12-18 NOTE — Telephone Encounter (Signed)
Informed patient mammogram was normal.  

## 2016-12-19 ENCOUNTER — Telehealth: Payer: Self-pay | Admitting: Obstetrics and Gynecology

## 2016-12-19 NOTE — Telephone Encounter (Signed)
Patient called stating she had a normal mammogram but wanted to know if she needed to have a 3D done. Spoke to Dr Emelda Fear who stated patient did not need to have another one done this year since it was normal but if she wanted to have a 3D next year, it would be fine. Verbalized understanding.

## 2018-01-05 ENCOUNTER — Other Ambulatory Visit: Payer: Self-pay | Admitting: Obstetrics and Gynecology

## 2018-01-05 DIAGNOSIS — Z1231 Encounter for screening mammogram for malignant neoplasm of breast: Secondary | ICD-10-CM

## 2018-01-06 ENCOUNTER — Other Ambulatory Visit: Payer: Self-pay

## 2018-01-06 ENCOUNTER — Ambulatory Visit
Admission: RE | Admit: 2018-01-06 | Discharge: 2018-01-06 | Disposition: A | Discharge status: Home / Self Care | Payer: 59 | Source: Ambulatory Visit | Attending: Obstetrics and Gynecology | Admitting: Obstetrics and Gynecology

## 2018-01-06 ENCOUNTER — Emergency Department (HOSPITAL_BASED_OUTPATIENT_CLINIC_OR_DEPARTMENT_OTHER): Payer: 59

## 2018-01-06 ENCOUNTER — Telehealth: Payer: Self-pay | Admitting: Obstetrics and Gynecology

## 2018-01-06 ENCOUNTER — Encounter (HOSPITAL_BASED_OUTPATIENT_CLINIC_OR_DEPARTMENT_OTHER): Payer: Self-pay | Admitting: *Deleted

## 2018-01-06 ENCOUNTER — Emergency Department (HOSPITAL_BASED_OUTPATIENT_CLINIC_OR_DEPARTMENT_OTHER)
Admission: EM | Admit: 2018-01-06 | Discharge: 2018-01-06 | Disposition: A | Discharge status: Home / Self Care | Payer: 59 | Attending: Emergency Medicine | Admitting: Emergency Medicine

## 2018-01-06 DIAGNOSIS — Z1231 Encounter for screening mammogram for malignant neoplasm of breast: Secondary | ICD-10-CM

## 2018-01-06 DIAGNOSIS — R5383 Other fatigue: Secondary | ICD-10-CM

## 2018-01-06 DIAGNOSIS — R51 Headache: Secondary | ICD-10-CM | POA: Insufficient documentation

## 2018-01-06 DIAGNOSIS — R519 Headache, unspecified: Secondary | ICD-10-CM

## 2018-01-06 LAB — URINALYSIS, MICROSCOPIC (REFLEX)

## 2018-01-06 LAB — CBC
HEMATOCRIT: 40 % (ref 36.0–46.0)
HEMOGLOBIN: 13 g/dL (ref 12.0–15.0)
MCH: 29.3 pg (ref 26.0–34.0)
MCHC: 32.5 g/dL (ref 30.0–36.0)
MCV: 90.1 fL (ref 80.0–100.0)
Platelets: 260 10*3/uL (ref 150–400)
RBC: 4.44 MIL/uL (ref 3.87–5.11)
RDW: 12.2 % (ref 11.5–15.5)
WBC: 6.3 10*3/uL (ref 4.0–10.5)
nRBC: 0 % (ref 0.0–0.2)

## 2018-01-06 LAB — URINALYSIS, ROUTINE W REFLEX MICROSCOPIC
Bilirubin Urine: NEGATIVE
Glucose, UA: NEGATIVE mg/dL
KETONES UR: NEGATIVE mg/dL
Nitrite: NEGATIVE
PH: 7 (ref 5.0–8.0)
Protein, ur: NEGATIVE mg/dL
Specific Gravity, Urine: 1.01 (ref 1.005–1.030)

## 2018-01-06 LAB — BASIC METABOLIC PANEL
Anion gap: 8 (ref 5–15)
BUN: 10 mg/dL (ref 8–23)
CHLORIDE: 104 mmol/L (ref 98–111)
CO2: 26 mmol/L (ref 22–32)
CREATININE: 0.57 mg/dL (ref 0.44–1.00)
Calcium: 8.8 mg/dL — ABNORMAL LOW (ref 8.9–10.3)
GFR calc Af Amer: >60 mL/min (ref >60)
GFR calc non Af Amer: >60 mL/min (ref >60)
GLUCOSE: 87 mg/dL (ref 70–99)
POTASSIUM: 3.3 mmol/L — AB (ref 3.5–5.1)
SODIUM: 138 mmol/L (ref 135–145)

## 2018-01-06 LAB — SEDIMENTATION RATE: SED RATE: 8 mm/h (ref 0–22)

## 2018-01-06 MED ORDER — METOCLOPRAMIDE HCL 5 MG/ML IJ SOLN
10.0000 mg | Freq: Once | INTRAMUSCULAR | Status: AC
  Administered 2018-01-06: 10 mg via INTRAVENOUS
  Filled 2018-01-06: qty 2

## 2018-01-06 MED ORDER — DIPHENHYDRAMINE HCL 50 MG/ML IJ SOLN
25.0000 mg | Freq: Once | INTRAMUSCULAR | Status: AC
  Administered 2018-01-06: 25 mg via INTRAVENOUS
  Filled 2018-01-06: qty 1

## 2018-01-06 MED ORDER — SODIUM CHLORIDE 0.9 % IV BOLUS
500.0000 mL | Freq: Once | INTRAVENOUS | Status: AC
  Administered 2018-01-06: 17:00:00 via INTRAVENOUS

## 2018-01-06 NOTE — Discharge Instructions (Signed)
Please read and follow all provided instructions.  Your diagnoses today include:  1. Acute nonintractable headache, unspecified headache type   2. Fatigue, unspecified type     Tests performed today include:  CT of your head which was normal and did not show any serious cause of your headache  Blood counts and electrolytes-normal hemoglobin, slightly low potassium  Urine test -shows a small amount of red and white blood cells, culture sent and is pending  Vital signs. See below for your results today.   Medications:  In the Emergency Department you received:  Reglan - antinausea/headache medication  Benadryl - antihistamine to counteract potential side effects of reglan  Take any prescribed medications only as directed.  Additional information:  Follow any educational materials contained in this packet.  You are having a headache. No specific cause was found today for your headache. It may have been a migraine or other cause of headache. Stress, anxiety, fatigue, and depression are common triggers for headaches.   Your headache today does not appear to be life-threatening or require hospitalization, but often the exact cause of headaches is not determined in the emergency department. Therefore, follow-up with your doctor is very important to find out what may have caused your headache and whether or not you need any further diagnostic testing or treatment.   Sometimes headaches can appear benign (not harmful), but then more serious symptoms can develop which should prompt an immediate re-evaluation by your doctor or the emergency department.  BE VERY CAREFUL not to take multiple medicines containing Tylenol (also called acetaminophen). Doing so can lead to an overdose which can damage your liver and cause liver failure and possibly death.   Follow-up instructions: Please follow-up with your primary care provider in the next 3 days for further evaluation of your symptoms.    Return instructions:   Please return to the Emergency Department if you experience worsening symptoms.  Return if the medications do not resolve your headache, if it recurs, or if you have multiple episodes of vomiting or cannot keep down fluids.  Return if you have a change from the usual headache.  RETURN IMMEDIATELY IF you:  Develop a sudden, severe headache  Develop confusion or become poorly responsive or faint  Develop a fever above 100.15F or problem breathing  Have a change in speech, vision, swallowing, or understanding  Develop new weakness, numbness, tingling, incoordination in your arms or legs  Have a seizure  Please return if you have any other emergent concerns.  Additional Information:  Your vital signs today were: BP 125/73 (BP Location: Left Arm)    Pulse 74    Temp 98.3 F (36.8 C) (Oral)    Resp 18    Ht 5\' 6"  (1.676 m)    Wt 81.6 kg    SpO2 98%    BMI 29.05 kg/m  If your blood pressure (BP) was elevated above 135/85 this visit, please have this repeated by your doctor within one month. --------------

## 2018-01-06 NOTE — ED Triage Notes (Signed)
Pt has been having a headache and feeling exhausted since last week. Gotten worse today.

## 2018-01-06 NOTE — ED Provider Notes (Signed)
[facility] EMERGENCY DEPARTMENT Provider Note   CSN: [id number] Arrival date & time: 01/06/18  1622     History   Chief Complaint Chief Complaint  Patient presents with  . Headache  . Fatigue    HPI Wendy Reeves is a 62 y.o. female.  Patient with no chronic medical problems presents to the emergency department with complaint of frontal headache which has been ongoing over the past week or so.  It has been intermittent.  She does not have a history of similar headaches.  She denies any injuries.  In addition she states that she has extreme fatigue.  She has missed the past 2 days of work because of her symptoms.  She does not have a primary care doctor.  She denies any fever, ear pain, facial pain or sore throat.  No chest pain or new shortness of breath.  No nausea, vomiting, diarrhea, abdominal pain.  No blood noted in the urine or the stool.  No urinary symptoms.  No skin rashes.  Patient denies signs of stroke including: facial droop, slurred speech, aphasia, weakness/numbness in extremities, imbalance/trouble walking. She has not taken any medications for her symptoms.     Past Medical History:  Diagnosis Date  . Arthritis   . Chronic constipation   . Depression   . Depression   . GERD (gastroesophageal reflux disease)   . H/O hiatal hernia    had surgery  . History of blood transfusion   . PONV (postoperative nausea and vomiting)   . Urinary incontinence 07/21/2012    Patient Active Problem List   Diagnosis Date Noted  . Primary osteoarthritis of left knee 08/09/2014  . S/P total knee replacement using cement 08/09/2014  . Urinary incontinence 07/21/2012  . Reflux 07/21/2012  . Osteoarthritis of knee 03/17/2012  . Pain in joint, lower leg 07/15/2011  . Stiffness of joint, not elsewhere classified, lower leg 07/15/2011  . Muscle weakness (generalized) 07/15/2011  . Difficulty in walking(719.7) 07/15/2011    Past Surgical History:  Procedure  Laterality Date  . ABDOMINAL HYSTERECTOMY    . APPENDECTOMY  1982  . CESAREAN SECTION    . CHOLECYSTECTOMY    . HERNIA REPAIR    . TOTAL KNEE ARTHROPLASTY  03/17/2012   Procedure: TOTAL KNEE ARTHROPLASTY;  Surgeon: Valeria Batman, MD;  Location: [facility] OR;  Service: Orthopedics;  Laterality: Right;  RIGHT TOTAL ARTHROPLASTY  . TOTAL KNEE ARTHROPLASTY Left 08/09/2014   Procedure: TOTAL KNEE ARTHROPLASTY;  Surgeon: Valeria Batman, MD;  Location: [facility] OR;  Service: Orthopedics;  Laterality: Left;     OB History    Gravida  2   Para  2   Term  2   Preterm      AB      Living  3     SAB      TAB      Ectopic      Multiple  1   Live Births  3            Home Medications    Prior to Admission medications   Medication Sig Start Date End Date Taking? Authorizing Provider  cycloSPORINE (RESTASIS) 0.05 % ophthalmic emulsion Place 1 drop into both eyes 2 (two) times daily.   Yes [provider]  DULoxetine (CYMBALTA) 60 MG capsule Take 120 mg by mouth daily.   Yes [provider]  ibuprofen (ADVIL,MOTRIN) 800 MG tablet TAKE 1 TABLET BY MOUTH 3 TIMES A DAY  AS NEEDED FOR INFLAMATION 06/19/16  Yes Valeria Batman, MD  Multiple Vitamins-Minerals (MULTIVITAMINS THER. W/MINERALS) TABS Take 1 tablet by mouth daily.   Yes [provider]  traZODone (DESYREL) 100 MG tablet Take 150 mg by mouth at bedtime.    Yes [provider]  doxycycline (VIBRAMYCIN) 100 MG capsule Take 1 capsule (100 mg total) by mouth 2 (two) times daily. 09/11/15   Barrett, Rolm Gala, PA-C  metroNIDAZOLE (FLAGYL) 500 MG tablet Take 1 tablet (500 mg total) by mouth 3 (three) times daily. 09/11/15   Barrett, Rolm Gala, PA-C  XIIDRA 5 % SOLN INSTILL 1 DROP INTO BOTH EYES TWICE A DAY 03/05/16   [provider]    Family History Family History  Problem Relation Age of Onset  . Cancer Sister        lung  . CAD Other   . Cancer Other   . Diabetes Other     Social  History Social History   Tobacco Use  . Smoking status: Never Smoker  . Smokeless tobacco: Never Used  Substance Use Topics  . Alcohol use: Yes    Comment: occ  . Drug use: No     Allergies   Penicillins and Sulfa antibiotics   Review of Systems Review of Systems  Constitutional: Negative for fever.  HENT: Negative for rhinorrhea and sore throat.   Eyes: Negative for redness.  Respiratory: Negative for cough.   Cardiovascular: Negative for chest pain.  Gastrointestinal: Negative for abdominal pain, diarrhea, nausea and vomiting.  Genitourinary: Negative for dysuria.  Musculoskeletal: Negative for myalgias.  Skin: Negative for rash.  Neurological: Negative for headaches.     Physical Exam Updated Vital Signs BP 125/73 (BP Location: Left Arm)   Pulse 74   Temp 98.3 F (36.8 C) (Oral)   Resp 18   Ht 5\' 6"  (1.676 m)   Wt 81.6 kg   SpO2 98%   BMI 29.05 kg/m   Physical Exam  Constitutional: She is oriented to person, place, and time. She appears well-developed and well-nourished.  HENT:  Head: Normocephalic and atraumatic.  Right Ear: Tympanic membrane, external ear and ear canal normal.  Left Ear: Tympanic membrane, external ear and ear canal normal.  Nose: No mucosal edema or rhinorrhea. Right sinus exhibits frontal sinus tenderness. Left sinus exhibits frontal sinus tenderness.  Mouth/Throat: Uvula is midline, oropharynx is clear and moist and mucous membranes are normal.  Eyes: Pupils are equal, round, and reactive to light. Conjunctivae, EOM and lids are normal. Right eye exhibits no nystagmus. Left eye exhibits no nystagmus.  Neck: Normal range of motion. Neck supple.  Cardiovascular: Normal rate and regular rhythm.  Pulmonary/Chest: Effort normal and breath sounds normal.  Abdominal: Soft. There is no tenderness.  Musculoskeletal:       Cervical back: She exhibits normal range of motion, no tenderness and no bony tenderness.  Neurological: She is alert and  oriented to person, place, and time. She has normal strength and normal reflexes. No cranial nerve deficit or sensory deficit. She displays a negative Romberg sign. Coordination and gait normal. GCS eye subscore is 4. GCS verbal subscore is 5. GCS motor subscore is 6.  Skin: Skin is warm and dry.  Psychiatric: She has a normal mood and affect.  Nursing note and vitals reviewed.    ED Treatments / Results  Labs (all labs ordered are listed, but only abnormal results are displayed) Labs Reviewed  BASIC METABOLIC PANEL - Abnormal; Notable for the following components:  Result Value   Potassium 3.3 (*)    Calcium 8.8 (*)    All other components within normal limits  URINALYSIS, ROUTINE W REFLEX MICROSCOPIC - Abnormal; Notable for the following components:   Hgb urine dipstick SMALL (*)    Leukocytes, UA SMALL (*)    All other components within normal limits  URINALYSIS, MICROSCOPIC (REFLEX) - Abnormal; Notable for the following components:   Bacteria, UA RARE (*)    All other components within normal limits  URINE CULTURE  CBC  SEDIMENTATION RATE    EKG None  Radiology Ct Head Wo Contrast  Result Date: 01/06/2018 CLINICAL DATA:  62 year old female with acute headache. EXAM: CT HEAD WITHOUT CONTRAST TECHNIQUE: Contiguous axial images were obtained from the base of the skull through the vertex without intravenous contrast. COMPARISON:  09/03/2007 CT FINDINGS: Brain: No evidence of acute infarction, hemorrhage, hydrocephalus, extra-axial collection or mass lesion/mass effect. Vascular: No hyperdense vessel or unexpected calcification. Skull: Normal. Negative for fracture or focal lesion. Sinuses/Orbits: No acute finding. Other: None. IMPRESSION: Unremarkable noncontrast head CT. Electronically Signed   By: Harmon Pier M.D.   On: 01/06/2018 17:28   Mm 3d Screen Breast Bilateral  Result Date: 01/06/2018 CLINICAL DATA:  Screening. EXAM: DIGITAL SCREENING BILATERAL MAMMOGRAM WITH TOMO  AND CAD COMPARISON:  Previous exam(s). ACR Breast Density Category c: The breast tissue is heterogeneously dense, which may obscure small masses. FINDINGS: There are no findings suspicious for malignancy. Images were processed with CAD. IMPRESSION: No mammographic evidence of malignancy. A result letter of this screening mammogram will be mailed directly to the patient. RECOMMENDATION: Screening mammogram in one year. (Code:SM-B-01Y) BI-RADS CATEGORY  1: Negative. Electronically Signed   By: Beckie Salts M.D.   On: 01/06/2018 15:36    Procedures Procedures (including critical care time)  Medications Ordered in ED Medications  metoCLOPramide (REGLAN) injection 10 mg (10 mg Intravenous Given 01/06/18 1737)  diphenhydrAMINE (BENADRYL) injection 25 mg (25 mg Intravenous Given 01/06/18 1736)  sodium chloride 0.9 % bolus 500 mL (0 mLs Intravenous Stopped 01/06/18 1800)     Initial Impression / Assessment and Plan / ED Course  I have reviewed the triage vital signs and the nursing notes.  Pertinent labs & imaging results that were available during my care of the patient were reviewed by me and considered in my medical decision making (see chart for details).     Patient seen and examined. Work-up initiated. Medications ordered.   Vital signs reviewed and are as follows: BP 125/73 (BP Location: Left Arm)   Pulse 74   Temp 98.3 F (36.8 C) (Oral)   Resp 18   Ht 5\' 6"  (1.676 m)   Wt 81.6 kg   SpO2 98%   BMI 29.05 kg/m   Patient does not have clinical signs symptoms of a sinus infection.  Her symptoms are very nonfocal.  No significant neuro deficits on exam.  Given her age and new onset headache, will check lab work, sed rate, head CT.  6:46 PM patient feeling better after treatment.  Discussed lab results.  Urine culture sent.  Strongly encourage PCP follow-up for further evaluation and recheck.  Patient counseled to return if they have weakness in their arms or legs, slurred speech,  trouble walking or talking, confusion, trouble with their balance, or if they have any other concerns. Patient verbalizes understanding and agrees with plan.    Final Clinical Impressions(s) / ED Diagnoses   Final diagnoses:  Acute nonintractable headache, unspecified headache  type  Fatigue, unspecified type   Patient without high-risk features of headache including: sudden onset/thunderclap HA, altered mental status, accompanying seizure, headache with exertion, history of immunocompromise, neck or shoulder pain, fever, use of anticoagulation, family history of spontaneous SAH, concomitant drug use, toxic exposure. CT ordered 2/2 new HA and age -- is neg. No sinusitis as well.   Patient has a normal complete neurological exam, normal vital signs, normal level of consciousness, no signs of meningismus, is well-appearing/non-toxic appearing, no signs of trauma.   No dangerous or life-threatening conditions suspected or identified by history, physical exam, and by work-up. No indications for hospitalization identified.     ED Discharge Orders    None       Renne Crigler, PA-C 01/06/18 Hulan Saas, DO 01/06/18 2227

## 2018-01-06 NOTE — Telephone Encounter (Signed)
Pt informed that the mammogram final report was benign.

## 2018-01-06 NOTE — ED Notes (Signed)
ED Provider at bedside. 

## 2018-01-08 LAB — URINE CULTURE

## 2018-01-13 DIAGNOSIS — M19039 Primary osteoarthritis, unspecified wrist: Secondary | ICD-10-CM | POA: Insufficient documentation

## 2018-02-11 DIAGNOSIS — Z8711 Personal history of peptic ulcer disease: Secondary | ICD-10-CM | POA: Insufficient documentation

## 2018-05-25 ENCOUNTER — Telehealth: Payer: Self-pay | Admitting: Adult Health

## 2018-05-25 MED ORDER — MIRABEGRON ER 25 MG PO TB24: 25.0000 mg | ORAL_TABLET | Freq: Every day | ORAL | 3 refills | Status: DC

## 2018-05-25 NOTE — Telephone Encounter (Signed)
Patient called, stated that she wants to speak to [facility] about something that happened before and it's happening again.  She wouldn't give me anymore details.  [phone]

## 2018-05-25 NOTE — Telephone Encounter (Signed)
Pt states that she saw Victorino Dike 10-15 years ago for the issue of not being able to hold her urine. She states that Victorino Dike put her on medication that she used for about a year and the issue got better. She states that she is having the same issue again and requests that medication be sent to her pharmacy. Advised that I would send her request to [facility] and she could check with the pharmacy later today if she did not hear back from me. Pt verbalized understanding.

## 2018-05-25 NOTE — Telephone Encounter (Signed)
Having UI again, will rx myrbetriq like before, if not better make appt, she is moving to [employer] [facility] [employer]

## 2018-10-21 ENCOUNTER — Other Ambulatory Visit: Payer: Self-pay | Admitting: Adult Health

## 2018-12-21 ENCOUNTER — Other Ambulatory Visit: Payer: Self-pay | Admitting: Obstetrics and Gynecology

## 2018-12-21 ENCOUNTER — Other Ambulatory Visit: Payer: Self-pay | Admitting: Adult Health

## 2018-12-21 DIAGNOSIS — Z1231 Encounter for screening mammogram for malignant neoplasm of breast: Secondary | ICD-10-CM

## 2019-01-07 DIAGNOSIS — H903 Sensorineural hearing loss, bilateral: Secondary | ICD-10-CM | POA: Insufficient documentation

## 2019-01-15 ENCOUNTER — Ambulatory Visit
Admission: RE | Admit: 2019-01-15 | Discharge: 2019-01-15 | Disposition: A | Discharge status: Home / Self Care | Payer: BC Managed Care – PPO | Source: Ambulatory Visit | Attending: Obstetrics and Gynecology | Admitting: Obstetrics and Gynecology

## 2019-01-15 ENCOUNTER — Other Ambulatory Visit: Payer: Self-pay

## 2019-01-15 DIAGNOSIS — Z1231 Encounter for screening mammogram for malignant neoplasm of breast: Secondary | ICD-10-CM

## 2019-01-18 ENCOUNTER — Telehealth: Payer: Self-pay | Admitting: Obstetrics and Gynecology

## 2019-01-18 NOTE — Telephone Encounter (Signed)
-----   Message from Jonnie Kind, MD sent at 01/18/2019  1:13 PM EDT ----- Normal mammogram, congrats\!

## 2019-01-18 NOTE — Telephone Encounter (Signed)
Left message confirming normal  Mammogram. Pt called back.and results reviewed c pt.

## 2019-02-02 ENCOUNTER — Ambulatory Visit: Payer: 59

## 2019-02-15 ENCOUNTER — Telehealth: Payer: Self-pay | Admitting: *Deleted

## 2019-02-15 NOTE — Telephone Encounter (Signed)
Pt has rash, on abdomen, near scar to come in 11/19 at 2 pm

## 2019-02-15 NOTE — Telephone Encounter (Signed)
Patient left message on nurse line requesting a return call from [place].  Did not leave details on the line.

## 2019-02-17 ENCOUNTER — Telehealth: Payer: Self-pay | Admitting: Adult Health

## 2019-02-17 NOTE — Telephone Encounter (Signed)

## 2019-02-18 ENCOUNTER — Ambulatory Visit: Payer: BC Managed Care – PPO | Admitting: Adult Health

## 2019-02-18 ENCOUNTER — Other Ambulatory Visit: Payer: Self-pay

## 2019-02-18 ENCOUNTER — Encounter: Payer: Self-pay | Admitting: Adult Health

## 2019-02-18 VITALS — BP 131/71 | HR 78 | Ht 65.0 in | Wt 195.0 lb

## 2019-02-18 DIAGNOSIS — B369 Superficial mycosis, unspecified: Secondary | ICD-10-CM | POA: Diagnosis not present

## 2019-02-18 DIAGNOSIS — R109 Unspecified abdominal pain: Secondary | ICD-10-CM

## 2019-02-18 DIAGNOSIS — R319 Hematuria, unspecified: Secondary | ICD-10-CM | POA: Insufficient documentation

## 2019-02-18 LAB — POCT URINALYSIS DIPSTICK
Glucose, UA: NEGATIVE
Ketones, UA: NEGATIVE
Leukocytes, UA: NEGATIVE
Nitrite, UA: NEGATIVE
Protein, UA: NEGATIVE

## 2019-02-18 MED ORDER — NYSTATIN-TRIAMCINOLONE 100000-0.1 UNIT/GM-% EX CREA: 1.0000 "application " | TOPICAL_CREAM | Freq: Two times a day (BID) | CUTANEOUS | 2 refills | Status: DC

## 2019-02-18 NOTE — Progress Notes (Signed)
Patient ID: Wendy Reeves, female   DOB: 1955-10-17, 63 y.o.   MRN: [id number] History of Present Illness: Wendy Reeves is a 63 year old white female, married, sp hysterectomy in complaining of abdominal pain for about a month, worse with eating, and rash under panniculus. PCP is Dr Luan Pulling   Current Medications, Allergies, Past Medical History, Past Surgical History, Family History and Social History were reviewed in [place] record.     Review of Systems: Pain in abdomen x 1 month worse with eating Denies problems with urination and BMs  Rash under panniculus   Physical Exam:BP 131/71 (BP Location: Left Arm, Patient Position: Sitting, Cuff Size: Normal)   Pulse 78   Ht 5\' 5"  (1.651 m)   Wt 195 lb (88.5 kg)   BMI 32.45 kg/m   Urine dipstick 2+blood General:  Well developed, well nourished, no acute distress Skin:  Warm and dry Neck:  Midline trachea, normal thyroid, good ROM, no lymphadenopathy Lungs; Clear to auscultation bilaterally Cardiovascular: Regular rate and rhythm Abdomen:  Soft, non tender, no hepatosplenomegaly, has skin fungus under panniculus  Pelvic:  External genitalia is normal in appearance, no lesions.  The vagina is pale, with loss of moisture and rugae.Marland Kitchen Urethra has no lesions or masses. The cervix and uterus are absent.  No adnexal masses or tenderness noted.Bladder is non tender, no masses felt. Psych:  No mood changes, alert and cooperative,seems happy Fall risk is low Co exam with Weyman Croon FNP student.  Impression and Plan:  1. Hematuria, unspecified type UA C&S sent  2. Abdominal pain, unspecified abdominal location Referred to GI, Roseanne Kaufman NP  3. Superficial fungus infection of skin Meds ordered this encounter  Medications  . nystatin-triamcinolone (MYCOLOG II) cream    Sig: Apply 1 application topically 2 (two) times daily.    Dispense:  30 g    Refill:  2    Order Specific Question:   Supervising Provider    Answer:    Tania Ade H [[id number]]   Follow up prn.

## 2019-02-19 LAB — URINALYSIS, ROUTINE W REFLEX MICROSCOPIC
Bilirubin, UA: NEGATIVE
Glucose, UA: NEGATIVE
Leukocytes,UA: NEGATIVE
Nitrite, UA: NEGATIVE
Protein,UA: NEGATIVE
Specific Gravity, UA: 1.021 (ref 1.005–1.030)
Urobilinogen, Ur: 0.2 mg/dL (ref 0.2–1.0)
pH, UA: 5.5 (ref 5.0–7.5)

## 2019-02-19 LAB — MICROSCOPIC EXAMINATION
Bacteria, UA: NONE SEEN
Casts: NONE SEEN /lpf

## 2019-02-19 LAB — SPECIMEN STATUS REPORT

## 2019-02-22 LAB — URINE CULTURE

## 2019-02-22 LAB — SPECIMEN STATUS REPORT

## 2019-02-23 ENCOUNTER — Encounter: Payer: Self-pay | Admitting: Internal Medicine

## 2019-02-24 ENCOUNTER — Telehealth: Payer: Self-pay | Admitting: Adult Health

## 2019-02-24 NOTE — Telephone Encounter (Signed)
Pt aware of urine and will come by next week to leave a urine for repeat UA C&S

## 2019-02-24 NOTE — Telephone Encounter (Signed)
Left message to recollect urine, RBCs elevated, and culture was canceled did not have enough urine.

## 2019-03-03 ENCOUNTER — Other Ambulatory Visit: Payer: Self-pay

## 2019-03-03 ENCOUNTER — Other Ambulatory Visit (INDEPENDENT_AMBULATORY_CARE_PROVIDER_SITE_OTHER): Payer: BC Managed Care – PPO | Admitting: *Deleted

## 2019-03-03 DIAGNOSIS — R319 Hematuria, unspecified: Secondary | ICD-10-CM | POA: Diagnosis not present

## 2019-03-03 LAB — POCT URINALYSIS DIPSTICK
Glucose, UA: NEGATIVE
Ketones, UA: NEGATIVE
Leukocytes, UA: NEGATIVE
Nitrite, UA: NEGATIVE
Protein, UA: NEGATIVE

## 2019-03-03 NOTE — Progress Notes (Signed)
   NURSE VISIT- UTI SYMPTOMS   SUBJECTIVE:  Wendy Reeves is a 63 y.o. G43P2003 female here for UTI symptoms. She is a GYN patient. She reports no symptomsOBJECTIVE:  There were no vitals taken for this visit.  Appears well, in no apparent distress  Results for orders placed or performed in visit on 03/03/19 (from the past 24 hour(s))  POCT Urinalysis Dipstick   Collection Time: 03/03/19 10:39 AM  Result Value Ref Range   Color, UA     Clarity, UA     Glucose, UA Negative Negative   Bilirubin, UA     Ketones, UA neg    Spec Grav, UA     Blood, UA trace    pH, UA     Protein, UA Negative Negative   Urobilinogen, UA     Nitrite, UA neg    Leukocytes, UA Negative Negative   Appearance     Odor      ASSESSMENT: GYN patient with UTI symptoms and negative nitrites  PLAN: Note routed to Derrek Monaco, AGNP   Rx sent by provider today: No Urine culture sent Call or return to clinic prn if these symptoms worsen or fail to improve as anticipated. Follow-up: as needed   Levy Pupa  03/03/2019 10:43 AM

## 2019-03-03 NOTE — Progress Notes (Signed)
Chart reviewed for nurse visit. Agree with plan of care.  Estill Dooms, NP 03/03/2019 12:18 PM

## 2019-03-04 LAB — URINALYSIS, ROUTINE W REFLEX MICROSCOPIC
Bilirubin, UA: NEGATIVE
Glucose, UA: NEGATIVE
Ketones, UA: NEGATIVE
Leukocytes,UA: NEGATIVE
Nitrite, UA: NEGATIVE
Protein,UA: NEGATIVE
RBC, UA: NEGATIVE
Specific Gravity, UA: 1.007 (ref 1.005–1.030)
Urobilinogen, Ur: 0.2 mg/dL (ref 0.2–1.0)
pH, UA: 7 (ref 5.0–7.5)

## 2019-03-05 LAB — URINE CULTURE

## 2019-03-08 ENCOUNTER — Telehealth: Payer: Self-pay | Admitting: *Deleted

## 2019-03-08 NOTE — Telephone Encounter (Signed)
Pt advised of urine results and voiced understanding. Will plan on seeing urologist. JSY

## 2019-03-08 NOTE — Telephone Encounter (Signed)
-----   Message from Estill Dooms, NP sent at 03/08/2019 12:09 PM EST ----- Let Wendy Reeves know no blood on urinalysis and the culture did not grow out anything

## 2019-03-08 NOTE — Progress Notes (Addendum)
REVIEWED-NO ADDITIONAL RECOMMENDATIONS.      Primary Care Physician:  Kari Baars, MD  Referring Provider: Cyril Mourning, NP Primary Gastroenterologist:  Dr. Darrick Penna   Chief Complaint  Patient presents with  . Abdominal Pain    mid abd, occurs after eating/drinking; x2 months  . Nausea    HPI:   Wendy Reeves is a 63 y.o. female presenting today at the request of Cyril Mourning, NP, due to abdominal pain.   Gallbladder absent. Onset of pain 2 months ago. Intermittent upper abdominal pain. No weight loss. No appetite at times. Coffee bothers at times. Belches after drinking water or eating. Upper abdominal pain. Postprandial. Nauseated with eating/drinking. Will wake up at night and drink Mylanta, Pepto. Sometimes ginger ale to try and calm. Sometimes has to get up and walk around. If eating/drinking and laying back, will have regurgitation. Believes she has a history of hiatal hernia repair. Solid food, liquid dysphagia. No odynophagia. Intermittent GERD. No PPI. Takes Ibuprofen for joint pain intermittently.   Social drinker. Rare ETOH. Non-smoker.   Colonoscopy: completed in [place], [place] but unsure the year. Does not recall any history of polyps.  EGD in remote past prior to hiatal hernia repair.   Past Medical History:  Diagnosis Date  . Arthritis   . Chronic constipation   . Depression   . Depression   . GERD (gastroesophageal reflux disease)   . H/O hiatal hernia    had surgery  . History of blood transfusion   . PONV (postoperative nausea and vomiting)   . Urinary incontinence 07/21/2012    Past Surgical History:  Procedure Laterality Date  . ABDOMINAL HYSTERECTOMY    . APPENDECTOMY  1982  . CATARACT EXTRACTION, BILATERAL    . CESAREAN SECTION    . CHOLECYSTECTOMY    . HERNIA REPAIR    . TOTAL KNEE ARTHROPLASTY  03/17/2012   Procedure: TOTAL KNEE ARTHROPLASTY;  Surgeon: Valeria Batman, MD;  Location[facility] OR;  Service: Orthopedics;  Laterality: Right;   RIGHT TOTAL ARTHROPLASTY  . TOTAL KNEE ARTHROPLASTY Left 08/09/2014   Procedure: TOTAL KNEE ARTHROPLASTY;  Surgeon: Valeria Batman, MD;  Location: [facility] OR;  Service: Orthopedics;  Laterality: Left;    Current Outpatient Medications  Medication Sig Dispense Refill  . cycloSPORINE (RESTASIS) 0.05 % ophthalmic emulsion Place 1 drop into both eyes 2 (two) times daily.    . DULoxetine (CYMBALTA) 60 MG capsule Take 120 mg by mouth daily.    Marland Kitchen ibuprofen (ADVIL,MOTRIN) 800 MG tablet TAKE 1 TABLET BY MOUTH 3 TIMES A DAY AS NEEDED FOR INFLAMATION 30 tablet 1  . Multiple Vitamins-Minerals (MULTIVITAMINS THER. W/MINERALS) TABS Take 1 tablet by mouth.     Marland Kitchen MYRBETRIQ 25 MG TB24 tablet TAKE 1 TABLET BY MOUTH EVERY DAY 30 tablet 1  . nystatin-triamcinolone (MYCOLOG II) cream Apply 1 application topically 2 (two) times daily. 30 g 2  . traZODone (DESYREL) 100 MG tablet Take 150-300 mg by mouth at bedtime.      No current facility-administered medications for this visit.     Allergies as of 03/09/2019 - Review Complete 03/09/2019  Allergen Reaction Noted  . Sulfur Hives 02/18/2019  . Penicillins Hives 03/02/2012  . Sulfa antibiotics Hives 03/02/2012    Family History  Problem Relation Age of Onset  . Cancer Sister        lung  . CAD Other   . Cancer Other   . Diabetes Other   . Breast cancer Neg Hx  Social History   Socioeconomic History  . Marital status: Married    Spouse name: Not on file  . Number of children: Not on file  . Years of education: Not on file  . Highest education level: Not on file  Occupational History  . Not on file  Social Needs  . Financial resource strain: Not on file  . Food insecurity    Worry: Not on file    Inability: Not on file  . Transportation needs    Medical: Not on file    Non-medical: Not on file  Tobacco Use  . Smoking status: Never Smoker  . Smokeless tobacco: Never Used  Substance and Sexual Activity  . Alcohol use: Yes    Comment:  occ  . Drug use: No  . Sexual activity: Yes    Birth control/protection: Surgical    Comment: hyst  Lifestyle  . Physical activity    Days per week: Not on file    Minutes per session: Not on file  . Stress: Not on file  Relationships  . Social Herbalist on phone: Not on file    Gets together: Not on file    Attends religious service: Not on file    Active member of club or organization: Not on file    Attends meetings of clubs or organizations: Not on file    Relationship status: Not on file  . Intimate partner violence    Fear of current or ex partner: Not on file    Emotionally abused: Not on file    Physically abused: Not on file    Forced sexual activity: Not on file  Other Topics Concern  . Not on file  Social History Narrative  . Not on file    Review of Systems: Gen: Denies any fever, chills, fatigue, weight loss, lack of appetite.  CV: Denies chest pain, heart palpitations, peripheral edema, syncope.  Resp: Denies shortness of breath at rest or with exertion. Denies wheezing or cough.  GI: see HPI GU : Denies urinary burning, urinary frequency, urinary hesitancy MS: Denies joint pain, muscle weakness, cramps, or limitation of movement.  Derm: Denies rash, itching, dry skin Psych: Denies depression, anxiety, memory loss, and confusion Heme: Denies bruising, bleeding, and enlarged lymph nodes.  Physical Exam: BP (!) 150/84   Pulse 71   Temp (!) 96.2 F (35.7 C) (Temporal)   Ht 5\' 5"  (1.651 m)   Wt 192 lb 9.6 oz (87.4 kg)   BMI 32.05 kg/m  General:   Alert and oriented. Pleasant and cooperative. Well-nourished and well-developed.  Head:  Normocephalic and atraumatic. Eyes:  Without icterus, sclera clear and conjunctiva pink.  Ears:  Normal auditory acuity. Lungs:  Clear to auscultation bilaterally. No wheezes, rales, or rhonchi. No distress.  Heart:  S1, S2 present without murmurs appreciated.  Abdomen:  +BS, soft, non-tender and non-distended.  No HSM noted. No guarding or rebound. No masses appreciated.  Rectal:  Deferred  Msk:  Symmetrical without gross deformities. Normal posture. Extremities:  Without edema. Neurologic:  Alert and  oriented x4;  grossly normal neurologically. Skin:  Intact without significant lesions or rashes. Psych:  Alert and cooperative. Normal mood and affect.  ASSESSMENT: SHAMYA MACFADDEN is a 63 y.o. female presenting today with 2 month history of dyspepsia and associated solid food/liquid dysphagia, worsening postprandially and associated with nausea. No weight loss or vomiting reported. Gallbladder is absent. Currently not taking a PPI and endorses occasional Ibuprofen  use. Needs EGD/dilation in near future, and we will start a PPI in the interim.    PLAN:  Proceed with upper endoscopy/dilation in the near future with Dr. Darrick PennaFields. The risks, benefits, and alternatives have been discussed in detail with patient. They have stated understanding and desire to proceed. Propofol due to polypharmacy  Start Protonix once each morning, 30 minutes before breakfast.   Limit/avoid NSAIDs  Will attempt to [place] outside [place]. No concerning lower GI signs/symptoms.   Wendy MinkAnna W. Chanson Teems, PhD, ANP-BC [facility]

## 2019-03-09 ENCOUNTER — Other Ambulatory Visit: Payer: Self-pay

## 2019-03-09 ENCOUNTER — Ambulatory Visit: Payer: BC Managed Care – PPO | Admitting: Gastroenterology

## 2019-03-09 ENCOUNTER — Encounter: Payer: Self-pay | Admitting: Gastroenterology

## 2019-03-09 DIAGNOSIS — R1310 Dysphagia, unspecified: Secondary | ICD-10-CM

## 2019-03-09 DIAGNOSIS — R1013 Epigastric pain: Secondary | ICD-10-CM | POA: Insufficient documentation

## 2019-03-09 MED ORDER — PANTOPRAZOLE SODIUM 40 MG PO TBEC: 40.0000 mg | DELAYED_RELEASE_TABLET | Freq: Every day | ORAL | 3 refills | Status: DC

## 2019-03-09 NOTE — Patient Instructions (Signed)
We are arranging an upper endoscopy with dilation in the near future.  I have sent in a medication called Protonix to take first thing each morning, 30 minutes before breakfast. It is best absorbed on an empty stomach.  If you have any worsening symptoms, please call!  Limit Ibuprofen as you are doing.  It was a pleasure to see you today. I want to create trusting relationships with patients to provide genuine, compassionate, and quality care. I value your feedback. If you receive a survey regarding your visit,  I greatly appreciate you taking time to fill this out.   Annitta Needs, PhD, ANP-BC [facility]

## 2019-03-10 NOTE — Progress Notes (Signed)
cc'ed to pcp °

## 2019-03-16 ENCOUNTER — Other Ambulatory Visit: Payer: Self-pay | Admitting: Women's Health

## 2019-03-24 ENCOUNTER — Other Ambulatory Visit: Payer: Self-pay

## 2019-03-24 ENCOUNTER — Encounter (HOSPITAL_COMMUNITY): Payer: Self-pay

## 2019-03-29 ENCOUNTER — Other Ambulatory Visit (HOSPITAL_COMMUNITY)
Admission: RE | Admit: 2019-03-29 | Discharge: 2019-03-29 | Disposition: A | Discharge status: Home / Self Care | Payer: BC Managed Care – PPO | Source: Ambulatory Visit | Attending: Gastroenterology | Admitting: Gastroenterology

## 2019-03-29 ENCOUNTER — Other Ambulatory Visit: Payer: Self-pay

## 2019-03-29 ENCOUNTER — Encounter (HOSPITAL_COMMUNITY)
Admission: RE | Admit: 2019-03-29 | Discharge: 2019-03-29 | Disposition: A | Discharge status: Home / Self Care | Payer: BC Managed Care – PPO | Source: Ambulatory Visit | Attending: Gastroenterology | Admitting: Gastroenterology

## 2019-03-29 DIAGNOSIS — Z20828 Contact with and (suspected) exposure to other viral communicable diseases: Secondary | ICD-10-CM | POA: Diagnosis not present

## 2019-03-29 DIAGNOSIS — Z01812 Encounter for preprocedural laboratory examination: Secondary | ICD-10-CM | POA: Insufficient documentation

## 2019-03-29 LAB — SARS CORONAVIRUS 2 (TAT 6-24 HRS): SARS Coronavirus 2: NEGATIVE

## 2019-03-30 ENCOUNTER — Encounter (HOSPITAL_COMMUNITY)
Admission: RE | Disposition: A | Discharge status: Home / Self Care | Payer: Self-pay | Source: Ambulatory Visit | Attending: Gastroenterology

## 2019-03-30 ENCOUNTER — Ambulatory Visit (HOSPITAL_COMMUNITY)
Admission: RE | Admit: 2019-03-30 | Discharge: 2019-03-30 | Disposition: A | Discharge status: Home / Self Care | Payer: BC Managed Care – PPO | Source: Ambulatory Visit | Attending: Gastroenterology | Admitting: Gastroenterology

## 2019-03-30 ENCOUNTER — Ambulatory Visit (HOSPITAL_COMMUNITY): Payer: BC Managed Care – PPO | Admitting: Anesthesiology

## 2019-03-30 DIAGNOSIS — K259 Gastric ulcer, unspecified as acute or chronic, without hemorrhage or perforation: Secondary | ICD-10-CM | POA: Insufficient documentation

## 2019-03-30 DIAGNOSIS — K5900 Constipation, unspecified: Secondary | ICD-10-CM | POA: Diagnosis not present

## 2019-03-30 DIAGNOSIS — F329 Major depressive disorder, single episode, unspecified: Secondary | ICD-10-CM | POA: Diagnosis not present

## 2019-03-30 DIAGNOSIS — Z880 Allergy status to penicillin: Secondary | ICD-10-CM | POA: Diagnosis not present

## 2019-03-30 DIAGNOSIS — Z90710 Acquired absence of both cervix and uterus: Secondary | ICD-10-CM | POA: Insufficient documentation

## 2019-03-30 DIAGNOSIS — K219 Gastro-esophageal reflux disease without esophagitis: Secondary | ICD-10-CM | POA: Insufficient documentation

## 2019-03-30 DIAGNOSIS — M1990 Unspecified osteoarthritis, unspecified site: Secondary | ICD-10-CM | POA: Diagnosis not present

## 2019-03-30 DIAGNOSIS — K222 Esophageal obstruction: Secondary | ICD-10-CM | POA: Insufficient documentation

## 2019-03-30 DIAGNOSIS — Z9841 Cataract extraction status, right eye: Secondary | ICD-10-CM | POA: Insufficient documentation

## 2019-03-30 DIAGNOSIS — Z9842 Cataract extraction status, left eye: Secondary | ICD-10-CM | POA: Diagnosis not present

## 2019-03-30 DIAGNOSIS — R1310 Dysphagia, unspecified: Secondary | ICD-10-CM | POA: Insufficient documentation

## 2019-03-30 DIAGNOSIS — Z801 Family history of malignant neoplasm of trachea, bronchus and lung: Secondary | ICD-10-CM | POA: Diagnosis not present

## 2019-03-30 DIAGNOSIS — Z79899 Other long term (current) drug therapy: Secondary | ICD-10-CM | POA: Insufficient documentation

## 2019-03-30 DIAGNOSIS — Z8249 Family history of ischemic heart disease and other diseases of the circulatory system: Secondary | ICD-10-CM | POA: Insufficient documentation

## 2019-03-30 DIAGNOSIS — Z833 Family history of diabetes mellitus: Secondary | ICD-10-CM | POA: Insufficient documentation

## 2019-03-30 DIAGNOSIS — Z9049 Acquired absence of other specified parts of digestive tract: Secondary | ICD-10-CM | POA: Insufficient documentation

## 2019-03-30 DIAGNOSIS — Z96653 Presence of artificial knee joint, bilateral: Secondary | ICD-10-CM | POA: Diagnosis not present

## 2019-03-30 DIAGNOSIS — Z8042 Family history of malignant neoplasm of prostate: Secondary | ICD-10-CM | POA: Diagnosis not present

## 2019-03-30 DIAGNOSIS — T39395A Adverse effect of other nonsteroidal anti-inflammatory drugs [NSAID], initial encounter: Secondary | ICD-10-CM | POA: Diagnosis not present

## 2019-03-30 DIAGNOSIS — Z882 Allergy status to sulfonamides status: Secondary | ICD-10-CM | POA: Insufficient documentation

## 2019-03-30 DIAGNOSIS — R1013 Epigastric pain: Secondary | ICD-10-CM

## 2019-03-30 HISTORY — PX: ESOPHAGOGASTRODUODENOSCOPY (EGD) WITH PROPOFOL: SHX5813

## 2019-03-30 HISTORY — PX: SAVORY DILATION: SHX5439

## 2019-03-30 HISTORY — PX: BIOPSY: SHX5522

## 2019-03-30 SURGERY — ESOPHAGOGASTRODUODENOSCOPY (EGD) WITH PROPOFOL
Anesthesia: General

## 2019-03-30 MED ORDER — LIDOCAINE VISCOUS HCL 2 % MT SOLN: 5.0000 mL | Freq: Once | OROMUCOSAL | Status: DC

## 2019-03-30 MED ORDER — CHLORHEXIDINE GLUCONATE CLOTH 2 % EX PADS: 6.0000 | MEDICATED_PAD | Freq: Once | CUTANEOUS | Status: DC

## 2019-03-30 MED ORDER — MINERAL OIL PO OIL
TOPICAL_OIL | ORAL | Status: AC
  Filled 2019-03-30: qty 30

## 2019-03-30 MED ORDER — PROPOFOL 10 MG/ML IV BOLUS
INTRAVENOUS | Status: DC | PRN
  Administered 2019-03-30: 40 mg via INTRAVENOUS

## 2019-03-30 MED ORDER — LIDOCAINE VISCOUS HCL 2 % MT SOLN
OROMUCOSAL | Status: AC
  Filled 2019-03-30: qty 15

## 2019-03-30 MED ORDER — STERILE WATER FOR IRRIGATION IR SOLN
Status: DC | PRN
  Administered 2019-03-30: 1.5 mL

## 2019-03-30 MED ORDER — KETAMINE HCL 10 MG/ML IJ SOLN
INTRAMUSCULAR | Status: DC | PRN
  Administered 2019-03-30: 15 mg via INTRAVENOUS

## 2019-03-30 MED ORDER — LACTATED RINGERS IV SOLN: Freq: Once | INTRAVENOUS | Status: AC

## 2019-03-30 MED ORDER — LIDOCAINE VISCOUS HCL 2 % MT SOLN
OROMUCOSAL | Status: DC | PRN
  Administered 2019-03-30: 5 mL via OROMUCOSAL

## 2019-03-30 MED ORDER — PROPOFOL 500 MG/50ML IV EMUL
INTRAVENOUS | Status: DC | PRN
  Administered 2019-03-30: 200 ug/kg/min via INTRAVENOUS

## 2019-03-30 MED ORDER — KETAMINE HCL 50 MG/5ML IJ SOSY
PREFILLED_SYRINGE | INTRAMUSCULAR | Status: AC
  Filled 2019-03-30: qty 5

## 2019-03-30 MED ORDER — LACTATED RINGERS IV SOLN: INTRAVENOUS | Status: DC | PRN

## 2019-03-30 NOTE — Anesthesia Postprocedure Evaluation (Signed)
Anesthesia Post Note  Patient: Wendy Reeves  Procedure(s) Performed: ESOPHAGOGASTRODUODENOSCOPY (EGD) WITH PROPOFOL (N/A ) SAVORY DILATION (N/A ) BIOPSY  Patient location during evaluation: PACU Anesthesia Type: General Level of consciousness: awake and alert and patient cooperative Pain management: satisfactory to patient Vital Signs Assessment: post-procedure vital signs reviewed and stable Respiratory status: spontaneous breathing Postop Assessment: no apparent nausea or vomiting Anesthetic complications: no     Last Vitals:  Vitals:   03/30/19 1013 03/30/19 1015  BP: 127/63 127/63  Pulse: 67 64  Resp: (!) 23 (!) 23  Temp: 36.7 C   SpO2: 95% 98%    Last Pain:  Vitals:   03/30/19 1013  TempSrc:   PainSc: 0-No pain                 Ayako Tapanes

## 2019-03-30 NOTE — H&P (Addendum)
Primary Care Physician:  Kari Baars, MD Primary Gastroenterologist:  Dr. Darrick Penna  Pre-Procedure History & Physical: HPI:  Wendy Reeves is a 63 y.o. female here for DYSPEPSIA/dysphagia.  Past Medical History:  Diagnosis Date  . Arthritis   . Chronic constipation   . Depression   . GERD (gastroesophageal reflux disease)   . H/O hiatal hernia    had surgery  . History of blood transfusion   . PONV (postoperative nausea and vomiting)   . Urinary incontinence 07/21/2012    Past Surgical History:  Procedure Laterality Date  . ABDOMINAL HYSTERECTOMY    . APPENDECTOMY  1982  . CATARACT EXTRACTION, BILATERAL    . CESAREAN SECTION    . CHOLECYSTECTOMY    . HIATAL HERNIA REPAIR    . ROTATOR CUFF REPAIR    . TOTAL KNEE ARTHROPLASTY  03/17/2012   Procedure: TOTAL KNEE ARTHROPLASTY;  Surgeon: Valeria Batman, MD;  Location: [facility] OR;  Service: Orthopedics;  Laterality: Right;  RIGHT TOTAL ARTHROPLASTY  . TOTAL KNEE ARTHROPLASTY Left 08/09/2014   Procedure: TOTAL KNEE ARTHROPLASTY;  Surgeon: Valeria Batman, MD;  Location: [facility] OR;  Service: Orthopedics;  Laterality: Left;    Prior to Admission medications   Medication Sig Start Date End Date Taking? Authorizing Provider  alum & mag hydroxide-simeth (MYLANTA) 200-200-20 MG/5ML suspension Take 15 mLs by mouth every 6 (six) hours as needed for indigestion or heartburn.   Yes [provider]  calcium carbonate (TUMS - DOSED IN MG ELEMENTAL CALCIUM) 500 MG chewable tablet Chew 1 tablet by mouth daily as needed for indigestion or heartburn.   Yes [provider]  cycloSPORINE (RESTASIS) 0.05 % ophthalmic emulsion Place 1 drop into both eyes 2 (two) times daily.   Yes [provider]  DULoxetine (CYMBALTA) 60 MG capsule Take 120 mg by mouth daily.   Yes [provider]  ibuprofen (ADVIL,MOTRIN) 800 MG tablet TAKE 1 TABLET BY MOUTH 3 TIMES A DAY AS NEEDED FOR INFLAMATION Patient taking differently: Take 800  mg by mouth every 8 (eight) hours as needed for moderate pain.  06/19/16  Yes Valeria Batman, MD  MYRBETRIQ 25 MG TB24 tablet TAKE 1 TABLET BY MOUTH EVERY DAY Patient taking differently: Take 25 mg by mouth daily.  12/21/18  Yes Cheral Marker, CNM  pantoprazole (PROTONIX) 40 MG tablet Take 1 tablet (40 mg total) by mouth daily. 30 minutes before breakfast 03/09/19  Yes Gelene Mink, NP  traZODone (DESYREL) 100 MG tablet Take 300 mg by mouth at bedtime.    Yes [provider]  nystatin-triamcinolone (MYCOLOG II) cream Apply 1 application topically 2 (two) times daily. Patient not taking: Reported on 03/16/2019 02/18/19   Adline Potter, NP    Allergies as of 03/09/2019 - Review Complete 03/09/2019  Allergen Reaction Noted  . Sulfur Hives 02/18/2019  . Penicillins Hives 03/02/2012  . Sulfa antibiotics Hives 03/02/2012    Family History  Problem Relation Age of Onset  . Prostate cancer Father   . Cancer Sister        lung  . CAD Other   . Cancer Other   . Diabetes Other   . Breast cancer Neg Hx   . Colon cancer Neg Hx   . Colon polyps Neg Hx     Social History   Socioeconomic History  . Marital status: Married    Spouse name: Not on file  . Number of children: Not on file  . Years of  education: Not on file  . Highest education level: Not on file  Occupational History    Comment: [employer], [place] at [employer]  . Smoking status: Never Smoker  . Smokeless tobacco: Never Used  Substance and Sexual Activity  . Alcohol use: Yes    Comment: occ  . Drug use: No  . Sexual activity: Yes    Birth control/protection: Surgical    Comment: hyst  Other Topics Concern  . Not on file  Social History Narrative  . Not on file   Social Determinants of Health   Financial Resource Strain:   . Difficulty of Paying Living Expenses: Not on file  Food Insecurity:   . Worried About Charity fundraiser in the Last Year: Not on file  . Ran Out  of Food in the Last Year: Not on file  Transportation Needs:   . Lack of Transportation (Medical): Not on file  . Lack of Transportation (Non-Medical): Not on file  Physical Activity:   . Days of Exercise per Week: Not on file  . Minutes of Exercise per Session: Not on file  Stress:   . Feeling of Stress : Not on file  Social Connections:   . Frequency of Communication with Friends and Family: Not on file  . Frequency of Social Gatherings with Friends and Family: Not on file  . Attends Religious Services: Not on file  . Active Member of Clubs or Organizations: Not on file  . Attends Archivist Meetings: Not on file  . Marital Status: Not on file  Intimate Partner Violence:   . Fear of Current or Ex-Partner: Not on file  . Emotionally Abused: Not on file  . Physically Abused: Not on file  . Sexually Abused: Not on file    Review of Systems: See HPI, otherwise negative ROS   Physical Exam: Pulse 60   Temp 98.5 F (36.9 C) (Oral)   Resp 16   SpO2 98%  General:   Alert,  pleasant and cooperative in NAD Head:  Normocephalic and atraumatic. Neck:  Supple; Lungs:  Clear throughout to auscultation.    Heart:  Regular rate and rhythm. Abdomen:  Soft, nontender and nondistended. Normal bowel sounds, without guarding, and without rebound.   Neurologic:  Alert and  oriented x4;  grossly normal neurologically.  Impression/Plan:   DYSPEPSIA/DYSPHAGIA  PLAN:  EGD/?dil TODAY.  DISCUSSED PROCEDURE, BENEFITS, & RISKS: < 1% chance of medication reaction, bleeding, perforation, or ASPIRATION.

## 2019-03-30 NOTE — Op Note (Signed)
[facility] Patient Name: Wendy Reeves Procedure Date: 03/30/2019 9:19 AM MRN: [id number] Date of Birth: 02/03/1956 Attending MD: Jonette Eva MD, MD CSN: [id number] Age: 63 Admit Type: Outpatient Procedure:                Upper GI endoscopy WITH COLD FORCEPS                            BIOPSY/ESOPHAGEAL DILATION Indications:              Dyspepsia, Dysphagia Providers:                Jonette Eva MD, MD, Buel Ream. Thomasena Edis RN, RN,                            Dyann Ruddle Referring MD:             Oneal Deputy. Juanetta Gosling MD, MD Medicines:                Propofol per Anesthesia Complications:            No immediate complications. Estimated Blood Loss:     Estimated blood loss was minimal. Procedure:                Pre-Anesthesia Assessment:                           - Prior to the procedure, a History and Physical                            was performed, and patient medications and                            allergies were reviewed. The patient's tolerance of                            previous anesthesia was also reviewed. The risks                            and benefits of the procedure and the sedation                            options and risks were discussed with the patient.                            All questions were answered, and informed consent                            was obtained. Prior Anticoagulants: The patient has                            taken no previous anticoagulant or antiplatelet                            agents except for NSAID medication. ASA Grade  Assessment: II - A patient with mild systemic                            disease. After reviewing the risks and benefits,                            the patient was deemed in satisfactory condition to                            undergo the procedure. After obtaining informed                            consent, the endoscope was passed under direct                            vision.  Throughout the procedure, the patient's                            blood pressure, pulse, and oxygen saturations were                            monitored continuously. The GIF-H190 ([id number]) was                            introduced through the mouth, and advanced to the                            duodenal bulb. The upper GI endoscopy was somewhat                            difficult due to the patient's agitation.                            Successful completion of the procedure was aided by                            increasing the dose of sedation medication. The                            patient tolerated the procedure fairly well. Scope In: 9:58:34 AM Scope Out: 10:05:37 AM Total Procedure Duration: 0 hours 7 minutes 3 seconds  Findings:      A low-grade of narrowing Schatzki ring was found at the gastroesophageal       junction. A guidewire was placed and the scope was withdrawn. Dilation       was performed with a Savary dilator with mild resistance at 16 mm and 17       mm.      HIATAL HERNIA REPAIR INTACT. Localized moderate inflammation       characterized by congestion (edema), erosions and erythema was found on       the greater curvature of the stomach and in the gastric antrum.       Biopsies(2;BODY,1:INCISURA,2:ANTRUM) were taken with a cold forceps for       Helicobacter pylori testing.  The examined duodenum was normal. Impression:               - Low-grade of narrowing Schatzki ring.                           - NAUSEA/ABDOMINAL PAIN DUE TO MODERATE EROSIVE                            NSAID Gastritis. Biopsied. Moderate Sedation:      Per Anesthesia Care Recommendation:           - Patient has a contact number available for                            emergencies. The signs and symptoms of potential                            delayed complications were discussed with the                            patient. Return to normal activities tomorrow.                             Written discharge instructions were provided to the                            patient.                           - Low fat diet.                           - Continue present medications. PROTONIX ONCE DAILY.                           - Await pathology results.                           - Return to GI office in 4 months. Procedure Code(s):        --- Professional ---                           [phone], Esophagogastroduodenoscopy, flexible,                            transoral; with insertion of guide wire followed by                            passage of dilator(s) through esophagus over guide                            wire                           43239, 59, Esophagogastroduodenoscopy, flexible,  transoral; with biopsy, single or multiple Diagnosis Code(s):        --- Professional ---                           K22.2, Esophageal obstruction                           K29.70, Gastritis, unspecified, without bleeding                           R10.13, Epigastric pain                           R13.10, Dysphagia, unspecified CPT copyright 2019 American Medical Association. All rights reserved. The codes documented in this report are preliminary and upon coder review may  be revised to meet current compliance requirements. Jonette Eva, MD Jonette Eva MD, MD 03/30/2019 10:23:35 AM This report has been signed electronically. Number of Addenda: 0

## 2019-03-30 NOTE — Transfer of Care (Addendum)
Immediate Anesthesia Transfer of Care Note  Patient: Wendy KEEVEN  Procedure(s) Performed: ESOPHAGOGASTRODUODENOSCOPY (EGD) WITH PROPOFOL (N/A ) SAVORY DILATION (N/A ) BIOPSY  Patient Location: PACU  Anesthesia Type:General  Level of Consciousness: awake, alert  and patient cooperative  Airway & Oxygen Therapy: Patient Spontanous Breathing  Post-op Assessment: Report given to RN and Post -op Vital signs reviewed and stable  Post vital signs: Reviewed and stable  Last Vitals:  Vitals Value Taken Time  BP    Temp 98   Pulse 67 03/30/19 1013  Resp    SpO2 95 % 03/30/19 1013  Vitals shown include unvalidated device data.  Last Pain:  Vitals:   03/30/19 0953  TempSrc:   PainSc: 0-No pain      Patients Stated Pain Goal: 7 (78/58/85 0277)  Complications: No apparent anesthesia complications

## 2019-03-30 NOTE — Discharge Instructions (Signed)
I STRETCHED your esophagus. You had a ring near the base of your esophagus. Your hiatal hernia repair is intact. Your nausea and upper abdominal pain is due to EROSIVE gastritis MOST LIKELY DUE TO IBUPROFEN. I biopsied your stomach.   DRINK WATER TO KEEP YOUR URINE LIGHT YELLOW.  FOLLOW A LOW FAT/SOFT MECHANICAL DIET. MEATS SHOULD BE BAKED, BROILED, OR BOILED.  MEATS SHOULD BE CHOPPED OR GROUND. SEE INFO BELOW.   CONTINUE PROTONIX. TAKE 30 MINUTES PRIOR TO BREAKFAST.  IT IS OK TO CONTINUE IBUPROFEN. ALTERNATIVES THAT WILL NOT CAUSE GASTRITIS OR NAUSEA INCLUDE TURMERIC ONCE DAILY OR ALPHA LIPOIC ACID 250 MG TWICE DAILY.  YOUR BIOPSY RESULTS WILL BE BACK IN 5 BUSINESS DAYS.  FOLLOW UP IN 4 MOS.  UPPER ENDOSCOPY AFTER CARE Read the instructions outlined below and refer to this sheet in the next week. These discharge instructions provide you with general information on caring for yourself after you leave the hospital. While your treatment has been planned according to the most current medical practices available, unavoidable complications occasionally occur. If you have any problems or questions after discharge, call DR. Trenese Haft, [phone].  ACTIVITY  You may resume your regular activity, but move at a slower pace for the next 24 hours.   Take frequent rest periods for the next 24 hours.   Walking will help get rid of the air and reduce the bloated feeling in your belly (abdomen).   No driving for 24 hours (because of the medicine (anesthesia) used during the test).   You may shower.   Do not sign any important legal documents or operate any machinery for 24 hours (because of the anesthesia used during the test).    NUTRITION  Drink plenty of fluids.   You may resume your normal diet as instructed by your doctor.   Begin with a light meal and progress to your normal diet. Heavy or fried foods are harder to digest and may make you feel sick to your stomach (nauseated).   Avoid  alcoholic beverages for 24 hours or as instructed.    MEDICATIONS  You may resume your normal medications.   WHAT YOU CAN EXPECT TODAY  Some feelings of bloating in the abdomen.   Passage of more gas than usual.    IF YOU HAD A BIOPSY TAKEN DURING THE UPPER ENDOSCOPY:  Eat a soft diet IF YOU HAVE NAUSEA, BLOATING, ABDOMINAL PAIN, OR VOMITING.    FINDING OUT THE RESULTS OF YOUR TEST Not all test results are available during your visit. DR. Darrick Penna WILL CALL YOU WITHIN 7 DAYS OF YOUR PROCEDUE WITH YOUR RESULTS. Do not assume everything is normal if you have not heard from DR. Janelle Spellman IN ONE WEEK, CALL HER OFFICE AT [phone].  SEEK IMMEDIATE MEDICAL ATTENTION AND CALL THE OFFICE: [phone] IF:  You have more than a spotting of blood in your stool.   Your belly is swollen (abdominal distention).   You are nauseated or vomiting.   You have a temperature over 101F.   You have abdominal pain or discomfort that is severe or gets worse throughout the day.  Gastritis  Gastritis is an inflammation (the body's way of reacting to injury and/or infection) of the stomach. It is often caused by viral or bacterial (germ) infections. It can also be caused BY ASPIRIN, BC/GOODY POWDER'S, (IBUPROFEN) MOTRIN, OR ALEVE (NAPROXEN), chemicals (including alcohol), SPICY FOODS, and medications. This illness may be associated with generalized malaise (feeling tired, not well), UPPER ABDOMINAL STOMACH cramps,  and fever. One common bacterial cause of gastritis is an organism known as H. Pylori. This can be treated with antibiotics.   ESOPHAGEAL RING Esophageal RING can be caused by stomach acid backing up into the tube that carries food from the mouth down to the stomach (lower esophagus).  TREATMENT There are a number of non-prescription medicines used to treat reflux/stricture including:  Antacids.  Proton-pump inhibitors: PROTONIX PEPCID OR TAGAMET  HOME CARE INSTRUCTIONS Eat 2-3 hours  before going to bed.  Try to reach and maintain a healthy weight.  Do not eat just a few very large meals. Instead, eat 4 TO 6 smaller meals throughout the day.  Try to identify foods and beverages that make your symptoms worse, and avoid these.  Avoid tight clothing.  Do not exercise right after eating.

## 2019-03-30 NOTE — Anesthesia Procedure Notes (Addendum)
Date/Time: 03/30/2019 9:48 AM Performed by: Vista Deck, CRNA Pre-anesthesia Checklist: Patient identified, Emergency Drugs available, Suction available, Timeout performed and Patient being monitored Patient Re-evaluated:Patient Re-evaluated prior to induction Oxygen Delivery Method: Nasal Cannula

## 2019-03-30 NOTE — Anesthesia Preprocedure Evaluation (Signed)
Anesthesia Evaluation  Patient identified by MRN, date of birth, ID band Patient awake    Reviewed: Allergy & Precautions, NPO status , Patient's Chart, lab work & pertinent test results, reviewed documented beta blocker date and time   History of Anesthesia Complications (+) PONV and history of anesthetic complications  Airway Mallampati: II  TM Distance: >3 FB Neck ROM: Full    Dental no notable dental hx.    Pulmonary neg pulmonary ROS,    Pulmonary exam normal breath sounds clear to auscultation       Cardiovascular Exercise Tolerance: Good Normal cardiovascular exam Rate:Normal     Neuro/Psych PSYCHIATRIC DISORDERS Depression negative neurological ROS     GI/Hepatic Neg liver ROS, hiatal hernia, GERD  Medicated,  Endo/Other  negative endocrine ROS  Renal/GU negative Renal ROS  negative genitourinary   Musculoskeletal  (+) Arthritis , Osteoarthritis,    Abdominal   Peds negative pediatric ROS (+)  Hematology negative hematology ROS (+)   Anesthesia Other Findings   Reproductive/Obstetrics negative OB ROS                             Anesthesia Physical Anesthesia Plan  ASA: II  Anesthesia Plan: General   Post-op Pain Management:    Induction: Intravenous  PONV Risk Score and Plan: 0 and TIVA  Airway Management Planned: Nasal Cannula, Natural Airway and Simple Face Mask  Additional Equipment:   Intra-op Plan:   Post-operative Plan:   Informed Consent: I have reviewed the patients History and Physical, chart, labs and discussed the procedure including the risks, benefits and alternatives for the proposed anesthesia with the patient or authorized representative who has indicated his/her understanding and acceptance.       Plan Discussed with: CRNA  Anesthesia Plan Comments:         Anesthesia Quick Evaluation

## 2019-03-31 ENCOUNTER — Other Ambulatory Visit: Payer: Self-pay

## 2019-03-31 ENCOUNTER — Telehealth: Payer: Self-pay | Admitting: Gastroenterology

## 2019-03-31 LAB — SURGICAL PATHOLOGY

## 2019-03-31 NOTE — Telephone Encounter (Signed)
Spoke with pt. Pt notified of results and SLF's recommendations. Pt apt is 07/28/2018 with AB.

## 2019-03-31 NOTE — Telephone Encounter (Signed)
Please call pt. HER stomach Bx shows EROSIVE gastritis MOST LIKELY DUE TO IBUPROFEN.   DRINK WATER TO KEEP YOUR URINE LIGHT YELLOW.  FOLLOW A LOW FAT/SOFT MECHANICAL DIET. MEATS SHOULD BE BAKED, BROILED, OR BOILED.  MEATS SHOULD BE CHOPPED OR GROUND.    CONTINUE PROTONIX. TAKE 30 MINUTES PRIOR TO BREAKFAST.  IT IS OK TO CONTINUE IBUPROFEN. ALTERNATIVES THAT WILL NOT CAUSE GASTRITIS OR NAUSEA INCLUDE TURMERIC ONCE DAILY OR ALPHA LIPOIC ACID 250 MG TWICE DAILY.  FOLLOW UP IN 4 MOS.

## 2019-04-01 ENCOUNTER — Other Ambulatory Visit: Payer: Self-pay | Admitting: Women's Health

## 2019-04-01 NOTE — Telephone Encounter (Signed)
OV made °

## 2019-05-31 ENCOUNTER — Other Ambulatory Visit: Payer: Self-pay | Admitting: Gastroenterology

## 2019-06-04 ENCOUNTER — Other Ambulatory Visit: Payer: Self-pay | Admitting: Adult Health

## 2019-07-27 ENCOUNTER — Encounter: Payer: Self-pay | Admitting: Gastroenterology

## 2019-07-27 NOTE — Progress Notes (Deleted)
EGD Dec 2020: Schatzki ring s/p dilation, moderate erosive NSAID gastritis s/p biopsy

## 2019-07-28 ENCOUNTER — Ambulatory Visit: Payer: BC Managed Care – PPO | Admitting: Gastroenterology

## 2019-07-28 ENCOUNTER — Telehealth: Payer: Self-pay | Admitting: Internal Medicine

## 2019-07-28 ENCOUNTER — Encounter: Payer: Self-pay | Admitting: Internal Medicine

## 2019-07-28 NOTE — Telephone Encounter (Signed)
PATIENT WAS A NO SHOW AND LETTER SENT  °

## 2019-08-04 ENCOUNTER — Other Ambulatory Visit: Payer: Self-pay | Admitting: Adult Health

## 2019-09-28 ENCOUNTER — Telehealth: Payer: Self-pay | Admitting: Adult Health

## 2019-09-28 ENCOUNTER — Other Ambulatory Visit: Payer: Self-pay | Admitting: Obstetrics and Gynecology

## 2019-09-28 MED ORDER — TRAZODONE HCL 150 MG PO TABS: 150.0000 mg | ORAL_TABLET | Freq: Every day | ORAL | 3 refills | Status: DC

## 2019-09-28 NOTE — Telephone Encounter (Addendum)
Do you have a recommendation for a family doc? Pt is needing some refills on meds. Pt is on Trazodone 100 mg, can take 300 mg qhs, Cymbalta 60 mg, takes 2 daily, Ibuprofen 800 mg, can take 1 q8hours, can take 3 daily. Gets a 90 day supply. Can you refill these for her? She said you can text her if you need to. Thanks!! JSY

## 2019-09-28 NOTE — Telephone Encounter (Signed)
Pt aware refill was sent to pharmacy. JSY 

## 2019-09-28 NOTE — Telephone Encounter (Signed)
Patient will like for someone to give her a call. She has a question about some medicine she is taking.

## 2019-09-28 NOTE — Progress Notes (Signed)
Refilled trazodone 150- 300  Mg hs x 90 days, refil x 1.

## 2019-09-28 NOTE — Telephone Encounter (Signed)
Refilled x 2 months to allow time to see new proficer.

## 2019-09-28 NOTE — Telephone Encounter (Signed)
Pt advised she needs to find a PCP and was advised to try Dr. Jeanice Lim @ [facility]. Pt will call Dr. Jeanice Lim but wants to know if Dr. Emelda Fear will refill Trazadone. Please advise. Thanks!! JSY

## 2019-10-21 ENCOUNTER — Other Ambulatory Visit: Payer: Self-pay | Admitting: Obstetrics and Gynecology

## 2019-11-09 ENCOUNTER — Encounter: Payer: Self-pay | Admitting: Nurse Practitioner

## 2019-11-09 ENCOUNTER — Other Ambulatory Visit: Payer: Self-pay

## 2019-11-09 ENCOUNTER — Ambulatory Visit (INDEPENDENT_AMBULATORY_CARE_PROVIDER_SITE_OTHER): Payer: Medicare Other | Admitting: Nurse Practitioner

## 2019-11-09 VITALS — BP 132/82 | HR 69 | Temp 97.6°F | Ht 65.0 in | Wt 196.0 lb

## 2019-11-09 DIAGNOSIS — R053 Chronic cough: Secondary | ICD-10-CM

## 2019-11-09 DIAGNOSIS — E669 Obesity, unspecified: Secondary | ICD-10-CM

## 2019-11-09 DIAGNOSIS — Z6832 Body mass index (BMI) 32.0-32.9, adult: Secondary | ICD-10-CM

## 2019-11-09 DIAGNOSIS — Z13228 Encounter for screening for other metabolic disorders: Secondary | ICD-10-CM

## 2019-11-09 DIAGNOSIS — Z114 Encounter for screening for human immunodeficiency virus [HIV]: Secondary | ICD-10-CM

## 2019-11-09 DIAGNOSIS — Z0000 Encounter for general adult medical examination without abnormal findings: Secondary | ICD-10-CM

## 2019-11-09 DIAGNOSIS — K219 Gastro-esophageal reflux disease without esophagitis: Secondary | ICD-10-CM

## 2019-11-09 DIAGNOSIS — Z7689 Persons encountering health services in other specified circumstances: Secondary | ICD-10-CM

## 2019-11-09 DIAGNOSIS — Z1321 Encounter for screening for nutritional disorder: Secondary | ICD-10-CM | POA: Diagnosis not present

## 2019-11-09 DIAGNOSIS — R05 Cough: Secondary | ICD-10-CM

## 2019-11-09 DIAGNOSIS — Z13220 Encounter for screening for lipoid disorders: Secondary | ICD-10-CM | POA: Diagnosis not present

## 2019-11-09 DIAGNOSIS — Z1159 Encounter for screening for other viral diseases: Secondary | ICD-10-CM

## 2019-11-09 NOTE — Patient Instructions (Addendum)
Drink plenty of water  Follow 2 gram sodium daily or less diet Low fat/cholesterol, carbohydrate, sugar diet  Get at least 20 minutes daily of exercise at least 4 times per week  Follow up for well exam every year.   Labs today will call with abnormal instructions and follow up if indicated.   Please have any records transferred to our office.   Persistent dry cough/GERD: She has a dry cough she has had for a while missing her daily PPI dose forgetting at time. Treatment plan to take if she forget the AM dose when she remembers, elevate the HOB, take pepcid 20mg  OTC 30 minutes prior to bedtime, avoid acidic foods, no eating or drinking 3 hours prior to bedtime.

## 2019-11-09 NOTE — Progress Notes (Signed)
New Patient Office Visit  Subjective:  Patient ID: Wendy Reeves, female    DOB: [birth year]  Age: 64 y.o. MRN: [id number]  CC: to establish care  HPI Wendy Reeves is a 64 year old female presenting to establish care and general adult examination. No concerns today with her health or medications. No cp, ct, gu/gi sxs, pain, sob, edema, palpitations, or recent injury falls.   She reports her records of many years with Dr Juanetta Gosling are lost. She reports having GYN for pap and Mammo utd due in October 2021. GI for GERD UTD for cscope 6 years completed, [facility]o bilateral knee rplct and workup immanent left shoulder surgery now with Ibuprofen prn and steroid joint injections, Trazodone as she is swing shift at [employer] for years. Mybetta costing a lot that gyn prescribed but she will ask for alternative from them or samples.   General examination completed today. She has a dry cough she has had for a while missing her daily PPI dose forgetting at time. Treatment plan to take if she forget the AM dose when she remembers, elevate the HOB, take pepcid 20mg  OTC 30 minutes prior to bedtime, avoid acidic foods, no eating or drinking 3 hours prior to bedtime.   Past Medical History:  Diagnosis Date  . Arthritis   . Chronic constipation   . Depression   . GERD (gastroesophageal reflux disease)   . H/O hiatal hernia    had surgery  . History of blood transfusion   . PONV (postoperative nausea and vomiting)   . Urinary incontinence 07/21/2012    Past Surgical History:  Procedure Laterality Date  . ABDOMINAL HYSTERECTOMY    . APPENDECTOMY  1982  . BIOPSY  03/30/2019   Procedure: BIOPSY;  Surgeon: 04/01/2019, MD;  Location: [facility];  Service: Endoscopy;;  gastric   . CATARACT EXTRACTION, BILATERAL    . CESAREAN SECTION    . CHOLECYSTECTOMY    . ESOPHAGOGASTRODUODENOSCOPY (EGD) WITH PROPOFOL N/A 03/30/2019   Schatzki ring s/p dilation, moderate erosive NSAID gastritis s/p  biopsy   . EYE SURGERY N/A    Phreesia 11/06/2019  . HIATAL HERNIA REPAIR    . JOINT REPLACEMENT N/A    Phreesia 11/06/2019  . ROTATOR CUFF REPAIR    . SAVORY DILATION N/A 03/30/2019   Procedure: SAVORY DILATION;  Surgeon: 04/01/2019, MD;  Location: [facility];  Service: Endoscopy;  Laterality: N/A;  . TOTAL KNEE ARTHROPLASTY  03/17/2012   Procedure: TOTAL KNEE ARTHROPLASTY;  Surgeon: 03/19/2012, MD;  Location: [facility] OR;  Service: Orthopedics;  Laterality: Right;  RIGHT TOTAL ARTHROPLASTY  . TOTAL KNEE ARTHROPLASTY Left 08/09/2014   Procedure: TOTAL KNEE ARTHROPLASTY;  Surgeon: 10/09/2014, MD;  Location: [facility] OR;  Service: Orthopedics;  Laterality: Left;    Family History  Problem Relation Age of Onset  . Prostate cancer Father   . Cancer Sister        lung  . CAD Other   . Cancer Other   . Diabetes Other   . Breast cancer Neg Hx   . Colon cancer Neg Hx   . Colon polyps Neg Hx     Social History   Socioeconomic History  . Marital status: Married    Spouse name: Not on file  . Number of children: Not on file  . Years of education: Not on file  . Highest education level: Not on file  Occupational History  Comment: [employer] [place]   Tobacco Use  . Smoking status: Never Smoker  . Smokeless tobacco: Never Used  Vaping Use  . Vaping Use: Never used  Substance and Sexual Activity  . Alcohol use: Yes    Comment: occ  . Drug use: No  . Sexual activity: Yes    Birth control/protection: Surgical    Comment: hyst  Other Topics Concern  . Not on file  Social History Narrative  . Not on file   Social Determinants of Health   Financial Resource Strain:   . Difficulty of Paying Living Expenses:   Food Insecurity:   . Worried About Programme researcher, broadcasting/film/videounning Out of Food in the Last Year:   . Baristaan Out of Food in the Last Year:   Transportation Needs:   . Freight forwarderLack of Transportation (Medical):   Marland Kitchen. Lack of Transportation (Non-Medical):   Physical  Activity:   . Days of Exercise per Week:   . Minutes of Exercise per Session:   Stress:   . Feeling of Stress :   Social Connections:   . Frequency of Communication with Friends and Family:   . Frequency of Social Gatherings with Friends and Family:   . Attends Religious Services:   . Active Member of Clubs or Organizations:   . Attends BankerClub or Organization Meetings:   Marland Kitchen. Marital Status:   Intimate Partner Violence:   . Fear of Current or Ex-Partner:   . Emotionally Abused:   Marland Kitchen. Physically Abused:   . Sexually Abused:     ROS Review of Systems  All other systems reviewed and are negative.   Objective:   Today's Vitals: BP 132/82   Pulse 69   Temp 97.6 F (36.4 C)   Ht 5\' 5"  (1.651 m)   Wt 196 lb (88.9 kg)   SpO2 97%   BMI 32.62 kg/m   Physical Exam Vitals and nursing note reviewed.  Constitutional:      General: She is not in acute distress.    Appearance: Normal appearance. She is well-developed and well-groomed. She is not ill-appearing, toxic-appearing or diaphoretic.  HENT:     Head: Normocephalic and atraumatic.     Right Ear: Hearing, ear canal and external ear normal.     Left Ear: Hearing, ear canal and external ear normal.     Nose: Nose normal.     Mouth/Throat:     Lips: Pink.     Mouth: Mucous membranes are moist.     Dentition: Normal dentition. No dental tenderness.     Tongue: No lesions. Tongue does not deviate from midline.     Pharynx: Oropharynx is clear. Uvula midline. No oropharyngeal exudate or posterior oropharyngeal erythema.  Eyes:     General: Lids are normal. Lids are everted, no foreign bodies appreciated.     Conjunctiva/sclera: Conjunctivae normal.     Pupils: Pupils are equal, round, and reactive to light.  Neck:     Thyroid: No thyromegaly or thyroid tenderness.     Vascular: No carotid bruit or JVD.  Cardiovascular:     Rate and Rhythm: Normal rate and regular rhythm.     Pulses: Normal pulses.     Heart sounds: Normal heart  sounds, S1 normal and S2 normal.  Pulmonary:     Effort: Pulmonary effort is normal.     Breath sounds: Normal breath sounds.  Abdominal:     General: Abdomen is flat. Bowel sounds are normal. There is no distension or  abdominal bruit.     Palpations: Abdomen is soft. There is no hepatomegaly or splenomegaly.     Tenderness: There is no abdominal tenderness. There is no right CVA tenderness, left CVA tenderness or guarding.  Musculoskeletal:        General: No swelling. Normal range of motion.     Cervical back: Normal, normal range of motion and neck supple.     Thoracic back: Normal.     Lumbar back: Normal.     Right lower leg: No edema.     Left lower leg: No edema.     Right ankle: Normal.     Left ankle: Normal.  Lymphadenopathy:     Head:     Right side of head: No submental, submandibular or tonsillar adenopathy.     Left side of head: No submental, submandibular or tonsillar adenopathy.     Cervical: No cervical adenopathy.  Skin:    General: Skin is warm and dry.     Capillary Refill: Capillary refill takes less than 2 seconds.     Coloration: Skin is not jaundiced or pale.  Neurological:     General: No focal deficit present.     Mental Status: She is alert and oriented to person, place, and time.     Gait: Gait normal.  Psychiatric:        Attention and Perception: Attention and perception normal.        Mood and Affect: Mood and affect normal.        Speech: Speech normal.        Behavior: Behavior normal. Behavior is cooperative.        Thought Content: Thought content normal.        Cognition and Memory: Cognition normal.        Judgment: Judgment normal.     Assessment & Plan:   Problem List Items Addressed This Visit    None    Visit Diagnoses    Encounter to establish care    -  Primary   Relevant Orders   Lipid panel   COMPLETE METABOLIC PANEL WITH GFR   CBC with Differential/Platelet   Gastroesophageal reflux disease, unspecified whether  esophagitis present       Relevant Orders   Lipid panel   COMPLETE METABOLIC PANEL WITH GFR   CBC with Differential/Platelet   Screening for metabolic disorder       Relevant Orders   Lipid panel   COMPLETE METABOLIC PANEL WITH GFR   CBC with Differential/Platelet   Screening, lipid       Relevant Orders   Lipid panel   COMPLETE METABOLIC PANEL WITH GFR   CBC with Differential/Platelet   Encounter for vitamin deficiency screening       Relevant Orders   Lipid panel   COMPLETE METABOLIC PANEL WITH GFR   CBC with Differential/Platelet   Adult general medical exam       Relevant Orders   Lipid panel   COMPLETE METABOLIC PANEL WITH GFR   CBC with Differential/Platelet   Need for hepatitis C screening test       Relevant Orders   Hepatitis C antibody   Encounter for screening for HIV       Relevant Orders   HIV Antibody (routine testing w rflx)   Class 1 obesity without serious comorbidity with body mass index (BMI) of 32.0 to 32.9 in adult, unspecified obesity type        Relevant Orders   Lipid  panel   Persistent dry cough        Drink plenty of water  Follow 2 gram sodium daily or less diet Low fat/cholesterol, carbohydrate, sugar diet  Get at least 20 minutes daily of exercise at least 4 times per week  Follow up for well exam every year.   Labs today will call with abnormal instructions and follow up if indicated.   Please have any records transferred to our office.   Persistent dry cough/GERD: She has a dry cough she has had for a while missing her daily PPI dose forgetting at time. Treatment plan to take if she forget the AM dose when she remembers, elevate the HOB, take pepcid 20mg  OTC 30 minutes prior to bedtime, avoid acidic foods, no eating or drinking 3 hours prior to bedtime.   Outpatient Encounter Medications as of 11/09/2019  Medication Sig  . alum & mag hydroxide-simeth (MYLANTA) 200-200-20 MG/5ML suspension Take 15 mLs by mouth every 6 (six) hours as  needed for indigestion or heartburn.  . ASPIRIN 81 PO Take by mouth.  . calcium carbonate (TUMS - DOSED IN MG ELEMENTAL CALCIUM) 500 MG chewable tablet Chew 1 tablet by mouth daily as needed for indigestion or heartburn.  . cycloSPORINE (RESTASIS) 0.05 % ophthalmic emulsion Place 1 drop into both eyes 2 (two) times daily.  . DULoxetine (CYMBALTA) 60 MG capsule Take 120 mg by mouth daily.  01/09/2020 ibuprofen (ADVIL,MOTRIN) 800 MG tablet TAKE 1 TABLET BY MOUTH 3 TIMES A DAY AS NEEDED FOR INFLAMATION (Patient taking differently: Take 800 mg by mouth every 8 (eight) hours as needed for moderate pain. )  . MYRBETRIQ 25 MG TB24 tablet TAKE 1 TABLET BY MOUTH EVERY DAY  . pantoprazole (PROTONIX) 40 MG tablet TAKE 1 TABLET (40 MG TOTAL) BY MOUTH DAILY. 30 MINUTES BEFORE BREAKFAST  . traZODone (DESYREL) 150 MG tablet TAKE 1-2 TABLETS (150-300 MG TOTAL) BY MOUTH AT BEDTIME.   No facility-administered encounter medications on file as of 11/09/2019.    Follow-up: Return in about 1 year (around 11/08/2020).   01/08/2021, FNP

## 2019-11-10 LAB — COMPLETE METABOLIC PANEL WITH GFR
AG Ratio: 2.1 (calc) (ref 1.0–2.5)
Albumin: 4.2 g/dL (ref 3.6–5.1)
BUN: 11 mg/dL (ref 7–25)
CO2: 27 mmol/L (ref 20–32)
Calcium: 8.9 mg/dL (ref 8.6–10.4)
Chloride: 104 mmol/L (ref 98–110)
Creat: 0.71 mg/dL (ref 0.50–0.99)
GFR, Est African American: 104 mL/min/{1.73_m2} (ref >=60)
GFR, Est Non African American: 89 mL/min/{1.73_m2} (ref >=60)
Globulin: 2 g/dL (calc) (ref 1.9–3.7)
Glucose, Bld: 81 mg/dL (ref 65–99)
Potassium: 3.9 mmol/L (ref 3.5–5.3)
Sodium: 140 mmol/L (ref 135–146)
Total Bilirubin: 0.3 mg/dL (ref 0.2–1.2)
Total Protein: 6.2 g/dL (ref 6.1–8.1)

## 2019-11-10 LAB — CBC WITH DIFFERENTIAL/PLATELET
Absolute Monocytes: 440 cells/uL (ref 200–950)
Basophils Absolute: 50 cells/uL (ref 0–200)
Basophils Relative: 1 %
Eosinophils Absolute: 340 cells/uL (ref 15–500)
Eosinophils Relative: 6.8 %
HCT: 41.8 % (ref 35.0–45.0)
Hemoglobin: 13.9 g/dL (ref 11.7–15.5)
Lymphs Abs: 1500 cells/uL (ref 850–3900)
MCH: 29.2 pg (ref 27.0–33.0)
MCHC: 33.3 g/dL (ref 32.0–36.0)
MCV: 87.8 fL (ref 80.0–100.0)
MPV: 9.6 fL (ref 7.5–12.5)
Monocytes Relative: 8.8 %
Neutro Abs: 2670 cells/uL (ref 1500–7800)
Neutrophils Relative %: 53.4 %
Platelets: 265 10*3/uL (ref 140–400)
RBC: 4.76 10*6/uL (ref 3.80–5.10)
RDW: 12 % (ref 11.0–15.0)
Total Lymphocyte: 30 %
WBC: 5 10*3/uL (ref 3.8–10.8)

## 2019-11-10 LAB — LIPID PANEL
Cholesterol: 194 mg/dL (ref <200)
HDL: 52 mg/dL (ref >=50)
LDL Cholesterol (Calc): 120 mg/dL (calc) — ABNORMAL HIGH
Non-HDL Cholesterol (Calc): 142 mg/dL (calc) — ABNORMAL HIGH (ref <130)
Total CHOL/HDL Ratio: 3.7 (calc) (ref <5.0)
Triglycerides: 111 mg/dL (ref <150)

## 2019-11-10 LAB — HIV ANTIBODY (ROUTINE TESTING W REFLEX): HIV 1&2 Ab, 4th Generation: NONREACTIVE

## 2019-11-10 NOTE — Progress Notes (Signed)
Labs look good follow up as planned.

## 2019-12-05 ENCOUNTER — Other Ambulatory Visit: Payer: Self-pay | Admitting: Gastroenterology

## 2019-12-10 ENCOUNTER — Other Ambulatory Visit: Payer: Self-pay

## 2019-12-10 MED ORDER — DULOXETINE HCL 60 MG PO CPEP: 120.0000 mg | ORAL_CAPSULE | Freq: Every day | ORAL | 2 refills | Status: DC

## 2019-12-22 ENCOUNTER — Other Ambulatory Visit: Payer: Self-pay | Admitting: Obstetrics and Gynecology

## 2019-12-22 DIAGNOSIS — Z1231 Encounter for screening mammogram for malignant neoplasm of breast: Secondary | ICD-10-CM

## 2019-12-31 ENCOUNTER — Other Ambulatory Visit: Payer: Self-pay | Admitting: Adult Health

## 2020-01-07 ENCOUNTER — Other Ambulatory Visit: Payer: Self-pay | Admitting: Nurse Practitioner

## 2020-01-07 MED ORDER — DULOXETINE HCL 60 MG PO CPEP: 120.0000 mg | ORAL_CAPSULE | Freq: Every day | ORAL | 2 refills | Status: DC

## 2020-01-07 NOTE — Telephone Encounter (Signed)
Prescription sent to pharmacy.

## 2020-01-07 NOTE — Telephone Encounter (Addendum)
Pharmacy: [id number] - [place], [place] - [address]   Medication:DULoxetine (CYMBALTA) 60 MG capsule [[id number]]   Qty:180  XHF:SFSE 2 capsules (120 mg total) by mouth daily   Requesting 90 day supply

## 2020-01-17 ENCOUNTER — Ambulatory Visit: Payer: BC Managed Care – PPO

## 2020-01-28 ENCOUNTER — Other Ambulatory Visit: Payer: Self-pay

## 2020-01-28 ENCOUNTER — Ambulatory Visit
Admission: RE | Admit: 2020-01-28 | Discharge: 2020-01-28 | Disposition: A | Discharge status: Home / Self Care | Payer: BC Managed Care – PPO | Source: Ambulatory Visit | Attending: Obstetrics and Gynecology | Admitting: Obstetrics and Gynecology

## 2020-01-28 DIAGNOSIS — Z1231 Encounter for screening mammogram for malignant neoplasm of breast: Secondary | ICD-10-CM

## 2020-01-31 ENCOUNTER — Ambulatory Visit: Payer: BC Managed Care – PPO

## 2020-02-02 ENCOUNTER — Other Ambulatory Visit: Payer: Self-pay | Admitting: Obstetrics and Gynecology

## 2020-02-02 DIAGNOSIS — R928 Other abnormal and inconclusive findings on diagnostic imaging of breast: Secondary | ICD-10-CM

## 2020-02-09 ENCOUNTER — Encounter: Payer: Self-pay | Admitting: Family Medicine

## 2020-02-09 ENCOUNTER — Other Ambulatory Visit: Payer: Self-pay

## 2020-02-09 ENCOUNTER — Ambulatory Visit (INDEPENDENT_AMBULATORY_CARE_PROVIDER_SITE_OTHER): Payer: Medicare Other | Admitting: Family Medicine

## 2020-02-09 VITALS — BP 130/74 | HR 76 | Temp 98.4°F | Resp 14 | Ht 65.0 in | Wt 190.0 lb

## 2020-02-09 DIAGNOSIS — J0100 Acute maxillary sinusitis, unspecified: Secondary | ICD-10-CM | POA: Diagnosis not present

## 2020-02-09 DIAGNOSIS — J209 Acute bronchitis, unspecified: Secondary | ICD-10-CM | POA: Diagnosis not present

## 2020-02-09 MED ORDER — GUAIFENESIN-CODEINE 100-10 MG/5ML PO SOLN: 5.0000 mL | Freq: Four times a day (QID) | ORAL | 0 refills | Status: DC | PRN

## 2020-02-09 MED ORDER — AZITHROMYCIN 250 MG PO TABS: ORAL_TABLET | ORAL | 0 refills | Status: DC

## 2020-02-09 NOTE — Progress Notes (Signed)
   Subjective:    Patient ID: Wendy Reeves, female    DOB: 07/14/1955, 64 y.o.   MRN: [id number]  Patient presents for Illness (x5 days- cough, chest tightness/ burning, sore throat, HA)  Pt here with  Dry cough but chest feels congsted and feels a burning in chest  , sore throat, HA for the past week. some sinus drainage . No known sick contacts, works from home  She has tried [employer], home made cough , COVID-19 Vaccine UTD Normal taste and smell No GI symptoms  Non smoker          Review Of Systems:  GEN- denies fatigue, fever, weight loss,weakness, recent illness HEENT- denies eye drainage, change in vision,+ nasal discharge, CVS- denies chest pain, palpitations RESP- denies SOB, +cough, wheeze ABD- denies N/V, change in stools, abd pain GU- denies dysuria, hematuria, dribbling, incontinence MSK- denies joint pain, muscle aches, injury Neuro-+ headache,denies  dizziness, syncope, seizure activity       Objective:    BP 130/74   Pulse 76   Temp 98.4 F (36.9 C) (Temporal)   Resp 14   Ht 5\' 5"  (1.651 m)   Wt 190 lb (86.2 kg)   SpO2 96%   BMI 31.62 kg/m   GEN- NAD, alert and oriented x3 HEENT- PERRL, EOMI, non injected sclera, pink conjunctiva, MMM, oropharynx clear, TM clear bilat no effusion,  + maxillary sinus tenderness, inflammed turbinates,  Nasal drainage  Neck- Supple, no LAD CVS- RRR, no murmur RESP-CTAB EXT- No edema Pulses- Radial 2+        Assessment & Plan:      Problem List Items Addressed This Visit    None    Visit Diagnoses    Acute maxillary sinusitis, recurrence not specified    -  Primary   strt zpak, nasal saline/flonase, robitussin codiene for cough, will swab for flu/covid-19 as well , normal oxygen sat    Relevant Medications   guaiFENesin-codeine 100-10 MG/5ML syrup   azithromycin (ZITHROMAX) 250 MG tablet   Other Relevant Orders   COVID19 and Influenza A & B   Acute bronchitis, unspecified organism       Relevant  Orders   COVID19 and Influenza A & B      Note: This dictation was prepared with Dragon dictation along with smaller phrase technology. Any transcriptional errors that result from this process are unintentional.

## 2020-02-09 NOTE — Patient Instructions (Addendum)
Flonase for nasal steroids  F/U AS NEEDED

## 2020-02-10 ENCOUNTER — Telehealth: Payer: Self-pay

## 2020-02-10 ENCOUNTER — Telehealth: Payer: Self-pay | Admitting: *Deleted

## 2020-02-10 NOTE — Telephone Encounter (Signed)
Pt stated that the combination of medications that was prescribed yesterday is making her nausea and wanted to know what should she do or could you prescribe something for the nausea.  Please advise

## 2020-02-10 NOTE — Telephone Encounter (Signed)
-----   Message from Salley Scarlet, MD sent at 02/10/2020  9:13 AM EST ----- Regarding: FW: Surgery forms Can you call pt and let her know, we do not have the surgery clearance forms? She can ask her ortho to fax again.     Dr. Jeanice Lim  ----- Message ----- From: Donita Brooks, MD Sent: 02/10/2020   6:34 AM EST To: Salley Scarlet, MD Subject: RE: Surgery forms                              No I haven't ----- Message ----- From: Salley Scarlet, MD Sent: 02/09/2020   8:17 PM EST To: Donita Brooks, MD Subject: Surgery forms                                    Have you seen clearance forms for this pt for a shoulder surgery?    [employer]

## 2020-02-10 NOTE — Telephone Encounter (Signed)
Call placed to patient and patient made aware.  

## 2020-02-11 ENCOUNTER — Other Ambulatory Visit: Payer: Self-pay | Admitting: Adult Health

## 2020-02-11 DIAGNOSIS — R928 Other abnormal and inconclusive findings on diagnostic imaging of breast: Secondary | ICD-10-CM

## 2020-02-11 LAB — SARS-COVID-2 RNA(COVID19)AND INFLUENZA A&B, QUALITATIVE NAAT
FLU A: NOT DETECTED
FLU B: NOT DETECTED
SARS CoV2 RNA: NOT DETECTED

## 2020-02-11 MED ORDER — ONDANSETRON HCL 4 MG PO TABS: 4.0000 mg | ORAL_TABLET | Freq: Three times a day (TID) | ORAL | 0 refills | Status: DC | PRN

## 2020-02-11 NOTE — Telephone Encounter (Signed)
Please advise 

## 2020-02-11 NOTE — Addendum Note (Signed)
Addended by: Milinda Antis F on: 02/11/2020 07:54 AM   Modules accepted: Orders

## 2020-02-11 NOTE — Telephone Encounter (Signed)
Call placed to patient and patient made aware.  

## 2020-02-11 NOTE — Telephone Encounter (Addendum)
She was given zpak and robitussin codiene cough med Only use cough med at bedtime to help with cough Take antibiotic with food I also sent zofran  Flu and COVID test are negative    Advise pt we found her surgical form, however she will need to schedule a pre op visit for labs, EKG before the form can be completed

## 2020-02-12 ENCOUNTER — Other Ambulatory Visit: Payer: Self-pay

## 2020-02-12 ENCOUNTER — Other Ambulatory Visit: Payer: Medicare Other

## 2020-02-12 ENCOUNTER — Ambulatory Visit: Payer: Medicare Other

## 2020-02-12 ENCOUNTER — Ambulatory Visit
Admission: RE | Admit: 2020-02-12 | Discharge: 2020-02-12 | Disposition: A | Discharge status: Home / Self Care | Payer: Medicare Other | Source: Ambulatory Visit | Attending: Cardiology | Admitting: Cardiology

## 2020-02-12 DIAGNOSIS — R928 Other abnormal and inconclusive findings on diagnostic imaging of breast: Secondary | ICD-10-CM

## 2020-02-14 ENCOUNTER — Telehealth: Payer: Self-pay | Admitting: Family Medicine

## 2020-02-14 NOTE — Telephone Encounter (Signed)
Provider Dr. Jeanice Lim and nurse called Wendy Reeves.

## 2020-02-14 NOTE — Telephone Encounter (Signed)
Pt was told to call the office if she doesn't start feeling better  after been seen from last week

## 2020-02-14 NOTE — Telephone Encounter (Signed)
Call placed to patient to inquire. LMTRC. 

## 2020-02-14 NOTE — Telephone Encounter (Signed)
Noted I need to know what residual symptoms patient has If more sinusitis that didn't respond to zpak, can try Levaquin 500mg  once a day If codiene cough med caused drowsiness, she can try tessalon perrles 200mg  TID prn

## 2020-02-15 MED ORDER — BENZONATATE 200 MG PO CAPS: 200.0000 mg | ORAL_CAPSULE | Freq: Two times a day (BID) | ORAL | 0 refills | Status: DC | PRN

## 2020-02-15 MED ORDER — LEVOFLOXACIN 500 MG PO TABS: 500.0000 mg | ORAL_TABLET | Freq: Every day | ORAL | 0 refills | Status: DC

## 2020-02-15 NOTE — Telephone Encounter (Signed)
Received return call from patient.   Reports that she continues to have dry hacky cough, low grade temperature (99.9) and sinus pressure, especially around eyes.   Gave MD recommendations. Prescription sent to pharmacy.

## 2020-03-23 ENCOUNTER — Ambulatory Visit (INDEPENDENT_AMBULATORY_CARE_PROVIDER_SITE_OTHER): Payer: Medicare Other | Admitting: Nurse Practitioner

## 2020-03-23 ENCOUNTER — Encounter: Payer: Self-pay | Admitting: Nurse Practitioner

## 2020-03-23 ENCOUNTER — Other Ambulatory Visit: Payer: Self-pay

## 2020-03-23 VITALS — BP 144/80 | HR 88 | Temp 98.2°F | Ht 65.0 in | Wt 188.8 lb

## 2020-03-23 DIAGNOSIS — Z8262 Family history of osteoporosis: Secondary | ICD-10-CM | POA: Diagnosis not present

## 2020-03-23 DIAGNOSIS — E559 Vitamin D deficiency, unspecified: Secondary | ICD-10-CM

## 2020-03-23 DIAGNOSIS — Z13820 Encounter for screening for osteoporosis: Secondary | ICD-10-CM

## 2020-03-23 DIAGNOSIS — N959 Unspecified menopausal and perimenopausal disorder: Secondary | ICD-10-CM

## 2020-03-23 DIAGNOSIS — J0110 Acute frontal sinusitis, unspecified: Secondary | ICD-10-CM | POA: Insufficient documentation

## 2020-03-23 DIAGNOSIS — Z0001 Encounter for general adult medical examination with abnormal findings: Secondary | ICD-10-CM

## 2020-03-23 DIAGNOSIS — Z0000 Encounter for general adult medical examination without abnormal findings: Secondary | ICD-10-CM

## 2020-03-23 HISTORY — DX: Acute frontal sinusitis, unspecified: J01.10

## 2020-03-23 MED ORDER — LORATADINE 10 MG PO TABS: 10.0000 mg | ORAL_TABLET | Freq: Every day | ORAL | 3 refills | Status: DC

## 2020-03-23 MED ORDER — DOXYCYCLINE HYCLATE 100 MG PO TABS: 100.0000 mg | ORAL_TABLET | Freq: Two times a day (BID) | ORAL | 0 refills | Status: DC

## 2020-03-23 NOTE — Progress Notes (Signed)
BP (!) 144/80   Pulse 88   Temp 98.2 F (36.8 C)   Ht 5\' 5"  (1.651 m)   Wt 188 lb 12.8 oz (85.6 kg)   SpO2 97%   BMI 31.42 kg/m    Subjective:    Patient ID: Wendy Reeves, female    DOB: 03-14-1956, 64 y.o.   MRN: [id number]  HPI: Wendy Reeves is a 64 y.o. female presenting on 03/23/2020 for comprehensive medical examination. Current medical complaints include:  UPPER RESPIRATORY TRACT INFECTION Is still having some congestion after being treated for a sinus infection in November.  Was treated with azithromycin and Levaquin and took both of these to completion. Onset: weeks Worst symptom: congestion cough Fever: no Cough: yes; dry Shortness of breath: no Wheezing: no Chest pain: no Chest tightness: no Chest congestion: no Nasal congestion: yes Runny nose: yes Post nasal drip: yes Sneezing: yes Sore throat: no  Swollen glands: no Sinus pressure: no Headache: yes Face pain: no Toothache: no Ear pain: no  Ear pressure: no  Eyes red/itching:no Eye drainage/crusting: no  Nausea: no Vomiting: no Diarrhea: no Change in appetite: no Loss of taste/smell: no  Rash: no Fatigue: yes Sick contacts: no Strep contacts: no  Context: stable Recurrent sinusitis: yes Treatments attempted:  Relief with OTC medications:  [place] [employer] [place] and has really been enjoying it.  She currently lives with: self Interim Problems from last visit: ongoing nasal congestion as discussed above.  Functional Status Survey: Is the patient deaf or have difficulty hearing?: No Does the patient have difficulty seeing, even when wearing glasses/contacts?: No Does the patient have difficulty concentrating, remembering, or making decisions?: No Does the patient have difficulty walking or climbing stairs?: No Does the patient have difficulty dressing or bathing?: No Does the patient have difficulty doing errands alone such as visiting  a doctor's office or shopping?: No  FALL RISK: Fall Risk  03/23/2020 02/18/2019  Falls in the past year? 0 0  Number falls in past yr: 0 -  Injury with Fall? 0 -    Depression Screen - patient declined today.  Denies suicidal thoughts or ideations. Is going through a tough time right now with the loss of her daughter.  Advanced Directives Is currently working on getting a living will established.  Past Medical History:  Past Medical History:  Diagnosis Date  . Arthritis   . Chronic constipation   . Depression   . GERD (gastroesophageal reflux disease)   . H/O hiatal hernia    had surgery  . History of blood transfusion   . PONV (postoperative nausea and vomiting)   . Urinary incontinence 07/21/2012    Surgical History:  Past Surgical History:  Procedure Laterality Date  . ABDOMINAL HYSTERECTOMY    . APPENDECTOMY  1982  . BIOPSY  03/30/2019   Procedure: BIOPSY;  Surgeon: West BaliFields, Sandi L, MD;  Location: [facility];  Service: Endoscopy;;  gastric   . CATARACT EXTRACTION, BILATERAL    . CESAREAN SECTION    . CHOLECYSTECTOMY    . ESOPHAGOGASTRODUODENOSCOPY (EGD) WITH PROPOFOL N/A 03/30/2019   Schatzki ring s/p dilation, moderate erosive NSAID gastritis s/p biopsy   . EYE SURGERY N/A    Phreesia 11/06/2019  . HERNIA REPAIR N/A    [employer]reesia 03/22/2020  . HIATAL HERNIA REPAIR    . JOINT REPLACEMENT N/A    [employer] 11/06/2019  . ROTATOR CUFF REPAIR    . SAVORY  DILATION N/A 03/30/2019   Procedure: SAVORY DILATION;  Surgeon: West Bali, MD;  Location: [facility];  Service: Endoscopy;  Laterality: N/A;  . TOTAL KNEE ARTHROPLASTY  03/17/2012   Procedure: TOTAL KNEE ARTHROPLASTY;  Surgeon: Valeria Batman, MD;  Location: [facility] OR;  Service: Orthopedics;  Laterality: Right;  RIGHT TOTAL ARTHROPLASTY  . TOTAL KNEE ARTHROPLASTY Left 08/09/2014   Procedure: TOTAL KNEE ARTHROPLASTY;  Surgeon: Valeria Batman, MD;  Location: [facility] OR;  Service: Orthopedics;  Laterality: Left;     Medications:  Current Outpatient Medications on File Prior to Visit  Medication Sig  . ASPIRIN 81 PO Take by mouth.  . cycloSPORINE (RESTASIS) 0.05 % ophthalmic emulsion Place 1 drop into both eyes 2 (two) times daily.  . DULoxetine (CYMBALTA) 60 MG capsule Take 2 capsules (120 mg total) by mouth daily.  Marland Kitchen ibuprofen (ADVIL,MOTRIN) 800 MG tablet TAKE 1 TABLET BY MOUTH 3 TIMES A DAY AS NEEDED FOR INFLAMATION (Patient taking differently: Take 800 mg by mouth every 8 (eight) hours as needed for moderate pain.)  . MYRBETRIQ 25 MG TB24 tablet TAKE 1 TABLET BY MOUTH EVERY DAY  . pantoprazole (PROTONIX) 40 MG tablet TAKE 1 TABLET (40 MG TOTAL) BY MOUTH DAILY. 30 MINUTES BEFORE BREAKFAST  . traZODone (DESYREL) 150 MG tablet TAKE 1-2 TABLETS (150-300 MG TOTAL) BY MOUTH AT BEDTIME.   No current facility-administered medications on file prior to visit.    Allergies:  Allergies  Allergen Reactions  . Sulfur Hives  . Penicillins Hives    Did it involve swelling of the face/tongue/throat, SOB, or low BP? Unknown Did it involve sudden or severe rash/hives, skin peeling, or any reaction on the inside of your mouth or nose? Unknown Did you need to seek medical attention at a hospital or doctor's office? Yes When did it last happen?More than 50 years ago If all above answers are "NO", may proceed with cephalosporin use.   . Sulfa Antibiotics Hives    Social History:  Social History   Socioeconomic History  . Marital status: Married    Spouse name: Not on file  . Number of children: Not on file  . Years of education: Not on file  . Highest education level: Not on file  Occupational History    Comment: [employer], [place] at [employer]  . Smoking status: Never Smoker  . Smokeless tobacco: Never Used  Vaping Use  . Vaping Use: Never used  Substance and Sexual Activity  . Alcohol use: Yes    Comment: occ  . Drug use: No  . Sexual activity: Yes    Birth  control/protection: Surgical    Comment: hyst  Other Topics Concern  . Not on file  Social History Narrative  . Not on file   Social Determinants of Health   Financial Resource Strain: Not on file  Food Insecurity: Not on file  Transportation Needs: Not on file  Physical Activity: Not on file  Stress: Not on file  Social Connections: Not on file  Intimate Partner Violence: Not on file   Social History   Tobacco Use  Smoking Status Never Smoker  Smokeless Tobacco Never Used   Social History   Substance and Sexual Activity  Alcohol Use Yes   Comment: occ    Family History:  Family History  Problem Relation Age of Onset  . Prostate cancer Father   . Cancer Sister        lung  .  CAD Other   . Cancer Other   . Diabetes Other   . Breast cancer Neg Hx   . Colon cancer Neg Hx   . Colon polyps Neg Hx     Past medical history, surgical history, medications, allergies, family history and social history reviewed with patient today and changes made to appropriate areas of the chart.   Review of Systems  Constitutional: Positive for malaise/fatigue. Negative for chills, fever and weight loss.  HENT: Positive for congestion and sinus pain. Negative for ear discharge and sore throat.   Eyes: Negative.   Respiratory: Positive for cough. Negative for shortness of breath and wheezing.   Cardiovascular: Negative.   Gastrointestinal: Negative.   Genitourinary: Negative.   Musculoskeletal: Negative.   Skin: Negative.  Negative for itching and rash.  Neurological: Negative.   Psychiatric/Behavioral: Negative.  Negative for depression, memory loss and suicidal ideas.   All other ROS negative except what is listed above and in the HPI.      Objective:    BP (!) 144/80   Pulse 88   Temp 98.2 F (36.8 C)   Ht  (1.651 m)   Wt 188 lb 12.8 oz (85.6 kg)   SpO2 97%   BMI 31.42 kg/m   Wt Readings from Last 3 Encounters:  03/23/20 188 lb 12.8 oz (85.6 kg)  02/09/20 190  lb (86.2 kg)  11/09/19 196 lb (88.9 kg)     Hearing Screening             Right ear:           Left ear:             Visual Acuity Screening   Right eye Left eye Both eyes  Without correction:  With correction:        Physical Exam Vitals and nursing note reviewed.  Constitutional:      General: She is not in acute distress.    Appearance: Normal appearance. She is normal weight. She is not ill-appearing or toxic-appearing.  HENT:     Head: Normocephalic and atraumatic.     Right Ear: Tympanic membrane, ear canal and external ear normal.     Left Ear: Tympanic membrane, ear canal and external ear normal.     Nose: No congestion or rhinorrhea.     Right Turbinates: Swollen.     Left Turbinates: Swollen.     Mouth/Throat:     Mouth: Mucous membranes are moist.     Pharynx: Oropharynx is clear. Posterior oropharyngeal erythema present. No oropharyngeal exudate.  Eyes:     General: No scleral icterus.    Extraocular Movements: Extraocular movements intact.  Cardiovascular:     Rate and Rhythm: Normal rate and regular rhythm.     Pulses: Normal pulses.     Heart sounds: Normal heart sounds. No murmur heard.   Pulmonary:     Effort: Pulmonary effort is normal. No respiratory distress.     Breath sounds: No wheezing or rhonchi.  Chest:  Breasts:     Right: Normal. No inverted nipple, mass, nipple discharge, skin change, axillary adenopathy or supraclavicular adenopathy.     Left: Normal. No inverted nipple, mass, nipple discharge, skin change, axillary adenopathy or supraclavicular adenopathy.    Abdominal:     General: Abdomen is flat. Bowel sounds are normal. There is no distension.     Palpations: Abdomen is soft.     Tenderness: There is no abdominal tenderness.  Musculoskeletal:        General: No swelling or tenderness. Normal range of motion.     Cervical back: Normal range of motion and neck  supple. No rigidity or tenderness.     Right lower leg: No edema.     Left lower leg: No edema.  Lymphadenopathy:     Upper Body:     Right upper body: No supraclavicular or axillary adenopathy.     Left upper body: No supraclavicular or axillary adenopathy.  Skin:    General: Skin is warm and dry.     Capillary Refill: Capillary refill takes less than 2 seconds.     Coloration: Skin is not jaundiced or pale.  Neurological:     General: No focal deficit present.     Mental Status: She is alert and oriented to person, place, and time.     Motor: No weakness.     Gait: Gait normal.  Psychiatric:        Mood and Affect: Mood normal.        Behavior: Behavior normal.        Thought Content: Thought content normal.        Judgment: Judgment normal.    Cognitive Testing - 6-CIT  Correct? Score   What year is it? yes 0 Yes = 0    No = 4  What month is it? yes 0 Yes = 0    No = 3  Remember:          What time is it? yes 0 Yes = 0    No = 3  Count backwards from 20 to 1 yes 0 Correct = 0    1 error = 2   More than 1 error = 4  Say the months of the year in reverse. yes 0 Correct = 0    1 error = 2   More than 1 error = 4  What address did I ask you to remember? yes 0 Correct = 0  1 error = 2    2 error = 4    3 error = 6    4 error = 8    All wrong = 10       TOTAL SCORE  0/28   Interpretation:  Normal  Normal (0-7) Abnormal (8-28)       Assessment & Plan:   Problem List Items Addressed This Visit      Respiratory   Acute frontal sinusitis    Acute, ongoing x weeks.  With ongoing frontal tenderness, will treat with doxycycline x 5 days.  Also start claritin and flonase to help with ?allergy symptoms.  With no improvement, may consider ENT referral.        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      Relevant Orders   VITAMIN D 25 Hydroxy (Vit-D Deficiency, Fractures)   DG Bone Density    Other Visit Diagnoses    Encounter for annual wellness exam in Medicare patient    -  Primary   Vitamin D deficiency, unspecified        Relevant Orders   VITAMIN D 25 Hydroxy (Vit-D Deficiency, Fractures)   Screening for osteoporosis  Relevant Orders   DG Bone Density   Unspecified menopausal and perimenopausal disorder        Relevant Orders   DG Bone Density       Preventative Services:  Health Risk Assessment and Personalized Prevention Plan: Bone Mass Measurements: DEXA ordered today Breast cancer screening: Mammogram UTD Cervical cancer screening: reports screening out due to age, will request records CVD Screening:  ASCVD 7.8% per last lipid check in November Colon Cancer Screening: will request records - reports she had when she was 27 and needs repeat when she is 35 Depression Screening: declined today Diabetes Screening: no risk factors/not indicated Glaucoma Screening: vision passed today Hepatitis B vaccine: UTD Hepatitis C screening: completed HIV Screening: completed Flu Vaccine: declined until after she receives COVID-19 booster Lung cancer Screening: n/a Obesity Screening: addresses Pneumonia Vaccines (2): declined until after she receives COVID-19 booster STI screening: n/a  Discussed aspirin prophylaxis for myocardial infarction prevention and decision was made to continue ASA  LABORATORY TESTING:  Health maintenance labs ordered today as discussed above.   IMMUNIZATIONS:   - Tdap: Tetanus vaccination status reviewed: last tetanus booster within 10 years. - Influenza: Refused - Pneumovax: Refused - Prevnar: Up to date - Zostavax vaccine: Refused  SCREENING: - Colonoscopy: reports is up-to-date, will request records  Discussed with patient purpose of the colonoscopy is to detect  colon cancer at curable precancerous or early stages   - AAA Screening: Not applicable  -Hearing Test: Up to date  -Spirometry: Not applicable   PATIENT COUNSELING:    Sexuality: Discussed sexually transmitted diseases, partner selection, use of condoms, avoidance of unintended pregnancy  and contraceptive alternatives.   Advised to avoid cigarette smoking.  I discussed with the patient that most people either abstain from alcohol or drink within safe limits (<=14/week and <=4 drinks/occasion for males, <=7/weeks and <= 3 drinks/occasion for females) and that the risk for alcohol disorders and other health effects rises proportionally with the number of drinks per week and how often a drinker exceeds daily limits.  Discussed cessation/primary prevention of drug use and availability of treatment for abuse.   Diet: Encouraged to adjust caloric intake to maintain  or achieve ideal body weight, to reduce intake of dietary saturated fat and total fat, to limit sodium intake by avoiding high sodium foods and not adding table salt, and to maintain adequate dietary potassium and calcium preferably from fresh fruits, vegetables, and low-fat dairy products.    stressed the importance of regular exercise  Injury prevention: Discussed safety belts, safety helmets, smoke detector, smoking near bedding or upholstery.   Dental health: Discussed importance of regular tooth brushing, flossing, and dental visits.   Follow up plan: NEXT PREVENTATIVE PHYSICAL DUE IN 1 YEAR. Return in about 6 months (around 09/21/2020) for follow up.

## 2020-03-23 NOTE — Assessment & Plan Note (Signed)
Acute, ongoing x weeks.  With ongoing frontal tenderness, will treat with doxycycline x 5 days.  Also start claritin and flonase to help with ?allergy symptoms.  With no improvement, may consider ENT referral.

## 2020-03-23 NOTE — Assessment & Plan Note (Signed)
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

## 2020-03-24 LAB — VITAMIN D 25 HYDROXY (VIT D DEFICIENCY, FRACTURES): Vit D, 25-Hydroxy: 37 ng/mL (ref 30–100)

## 2020-04-24 ENCOUNTER — Other Ambulatory Visit: Payer: Self-pay | Admitting: Women's Health

## 2020-04-24 ENCOUNTER — Other Ambulatory Visit: Payer: Self-pay | Admitting: Nurse Practitioner

## 2020-05-30 ENCOUNTER — Telehealth: Payer: Self-pay | Admitting: Adult Health

## 2020-05-30 NOTE — Telephone Encounter (Signed)
Pt's insurance no longer covers Myrbetriq, pt states needs generic if avail  Please advise & call pt    [facility]

## 2020-05-31 ENCOUNTER — Telehealth: Payer: Self-pay | Admitting: *Deleted

## 2020-05-31 MED ORDER — OXYBUTYNIN CHLORIDE ER 10 MG PO TB24: 10.0000 mg | ORAL_TABLET | Freq: Every day | ORAL | 6 refills | Status: DC

## 2020-05-31 NOTE — Telephone Encounter (Signed)
LMOVM Wendy Reeves sent in Rx for oxybutynin to try since Myrbetriq is no longer covered. Advised to call back if she has further questions.

## 2020-05-31 NOTE — Telephone Encounter (Signed)
Will rx oxybutynin as trial since myrbetriq no longer covered.

## 2020-06-24 ENCOUNTER — Other Ambulatory Visit: Payer: Self-pay | Admitting: Women's Health

## 2020-07-03 ENCOUNTER — Other Ambulatory Visit: Payer: Self-pay | Admitting: Women's Health

## 2020-07-04 ENCOUNTER — Other Ambulatory Visit: Payer: Self-pay | Admitting: *Deleted

## 2020-07-04 MED ORDER — DULOXETINE HCL 60 MG PO CPEP: 120.0000 mg | ORAL_CAPSULE | Freq: Every day | ORAL | 2 refills | Status: DC

## 2020-07-13 ENCOUNTER — Telehealth: Payer: Self-pay | Admitting: *Deleted

## 2020-07-13 NOTE — Telephone Encounter (Signed)
Received VM from patient.   Reports that she had CPE in 03/2020. States that proof of CPE was to be sent to her job at [employer].   Call placed to patient to inquire. LMTRC

## 2020-07-17 NOTE — Telephone Encounter (Signed)
Call placed to patient.  Form will be re-sent.

## 2020-07-18 ENCOUNTER — Ambulatory Visit (INDEPENDENT_AMBULATORY_CARE_PROVIDER_SITE_OTHER): Payer: BC Managed Care – PPO | Admitting: Nurse Practitioner

## 2020-07-18 ENCOUNTER — Other Ambulatory Visit: Payer: Self-pay

## 2020-07-18 ENCOUNTER — Encounter: Payer: Self-pay | Admitting: Nurse Practitioner

## 2020-07-18 VITALS — BP 148/82 | HR 88 | Temp 98.2°F | Ht 65.0 in | Wt 183.0 lb

## 2020-07-18 DIAGNOSIS — R030 Elevated blood-pressure reading, without diagnosis of hypertension: Secondary | ICD-10-CM

## 2020-07-18 DIAGNOSIS — H938X2 Other specified disorders of left ear: Secondary | ICD-10-CM | POA: Diagnosis not present

## 2020-07-18 DIAGNOSIS — R002 Palpitations: Secondary | ICD-10-CM | POA: Diagnosis not present

## 2020-07-18 DIAGNOSIS — R42 Dizziness and giddiness: Secondary | ICD-10-CM | POA: Diagnosis not present

## 2020-07-18 LAB — URINALYSIS, ROUTINE W REFLEX MICROSCOPIC
Bacteria, UA: NONE SEEN /HPF
Bilirubin Urine: NEGATIVE
Glucose, UA: NEGATIVE
Hyaline Cast: NONE SEEN /LPF
Ketones, ur: NEGATIVE
Nitrite: NEGATIVE
Protein, ur: NEGATIVE
Specific Gravity, Urine: 1.002 (ref 1.001–1.03)
WBC, UA: NONE SEEN /HPF (ref 0–5)
pH: 6.5 (ref 5.0–8.0)

## 2020-07-18 LAB — MICROSCOPIC MESSAGE

## 2020-07-18 MED ORDER — FLUTICASONE PROPIONATE 50 MCG/ACT NA SUSP: 2.0000 | Freq: Every day | NASAL | 6 refills | Status: AC

## 2020-07-18 NOTE — Progress Notes (Addendum)
Subjective:    Patient ID: Wendy Reeves, female    DOB: 07-19-1955, 65 y.o.   MRN: [id number]  HPI: Wendy Reeves is a 65 y.o. female presenting for dizziness and palpitations.  Chief Complaint  Patient presents with  . Palpitations    Reoccuring with headaches. Also wants urine checked to make sure no uti   DIZZINESS Happened 1 week ago, felt heart was racing, felt dizzy all day.  Happens about every other day or so, late morning to early morning.  Started taking Oxybutynin instead of Myrbetriq a couple of weeks ago.  Does not think stress is related.  Requesting UA today because she feels "off ". Duration: ~ 8 days   Description of symptoms: off kilter Duration of episode: hours Dizziness frequency: recurrent Aggravating/provoking factors: standing up,  Triggered by rolling over in bed: no Triggered by bending over: yes Aggravated by head movement: no Aggravated by exertion: no Aggravated by coughing: no Aggravated by loud noises: no Recent head injury: no Recent or current viral symptoms: no - eyes itchy, sometimes stuffy History of vasovagal episodes: no Nausea: no Vomiting: no Tinnitus: yes; L ear worse than right Hearing loss: yes Aural fullness: yes Headache: yes; especially after Photophobia: no Phonophobia: no Unsteady gait: yes Postural instability: no Diplopia: Dysarthria: Dysphagia: Weakness: yes Related to exertion: no Pallor: no Diaphoresis: no Dyspnea: no Chest pain: no  Palpitations; yes  PALPITATIONS Duration: days Symptom description: heart racing Duration of episode: hours Frequency: no history of the same Activity when event occurred: random Related to exertion: no Dyspnea: no Chest pain: no Syncope: no Anxiety/stress: no Nausea/vomiting: no Diaphoresis: no Coronary artery disease: no Congestive heart failure: no Arrhythmia:no Thyroid disease: no Caffeine intake: 2 cups of coffee in the morning, tea or soft drink, sometimes  water Status:  fluctuating Treatments attempted: drinks 3-4 yeti cups per day,   Allergies  Allergen Reactions  . Elemental Sulfur Hives  . Penicillins Hives    Did it involve swelling of the face/tongue/throat, SOB, or low BP? Unknown Did it involve sudden or severe rash/hives, skin peeling, or any reaction on the inside of your mouth or nose? Unknown Did you need to seek medical attention at a hospital or doctor's office? Yes When did it last happen?More than 50 years ago If all above answers are "NO", may proceed with cephalosporin use.   . Sulfa Antibiotics Hives    Outpatient Encounter Medications as of 07/18/2020  Medication Sig Note  . ASPIRIN 81 PO Take by mouth.   . cycloSPORINE (RESTASIS) 0.05 % ophthalmic emulsion Place 1 drop into both eyes 2 (two) times daily.   Marland Kitchen doxycycline (VIBRA-TABS) 100 MG tablet Take 1 tablet (100 mg total) by mouth 2 (two) times daily.   . DULoxetine (CYMBALTA) 60 MG capsule Take 2 capsules (120 mg total) by mouth daily.   Marland Kitchen ibuprofen (ADVIL,MOTRIN) 800 MG tablet TAKE 1 TABLET BY MOUTH 3 TIMES A DAY AS NEEDED FOR INFLAMATION (Patient taking differently: Take 800 mg by mouth every 8 (eight) hours as needed for moderate pain.) 11/09/2019: Prn   . loratadine (CLARITIN) 10 MG tablet Take 1 tablet (10 mg total) by mouth daily.   Marland Kitchen oxybutynin (DITROPAN-XL) 10 MG 24 hr tablet Take 1 tablet (10 mg total) by mouth at bedtime.   . pantoprazole (PROTONIX) 40 MG tablet TAKE 1 TABLET (40 MG TOTAL) BY MOUTH DAILY. 30 MINUTES BEFORE BREAKFAST   . traZODone (DESYREL) 150 MG tablet TAKE 1-2 TABLETS (  150-300 MG TOTAL) BY MOUTH AT BEDTIME.    No facility-administered encounter medications on file as of 07/18/2020.    Patient Active Problem List   Diagnosis Date Noted  . Family history of osteoporosis in mother 03/23/2020  . Dyspepsia 03/09/2019  . Dysphagia 03/09/2019  . Superficial fungus infection of skin 02/18/2019  . Abdominal pain 02/18/2019  . Hematuria  02/18/2019  . Sensorineural hearing loss (SNHL) of both ears 01/07/2019  . History of peptic ulcer 02/11/2018  . Primary osteoarthritis of left knee 08/09/2014  . S/P total knee replacement using cement 08/09/2014  . Urinary incontinence 07/21/2012  . Reflux 07/21/2012  . Osteoarthritis of knee 03/17/2012  . Pain in joint, lower leg 07/15/2011  . Stiffness of joint, not elsewhere classified, lower leg 07/15/2011  . Muscle weakness (generalized) 07/15/2011  . Difficulty in walking(719.7) 07/15/2011    Past Medical History:  Diagnosis Date  . Acute frontal sinusitis 03/23/2020  . Arthritis   . Chronic constipation   . Depression   . GERD (gastroesophageal reflux disease)   . H/O hiatal hernia    had surgery  . History of blood transfusion   . PONV (postoperative nausea and vomiting)   . Urinary incontinence 07/21/2012    Relevant past medical, surgical, family and social history reviewed and updated as indicated. Interim medical history since our last visit reviewed.  Review of Systems Per HPI unless specifically indicated above     Objective:    BP (!) 148/82   Pulse 88   Temp 98.2 F (36.8 C)   Ht 5\' 5"  (1.651 m)   Wt 183 lb (83 kg)   SpO2 98%   BMI 30.45 kg/m   Wt Readings from Last 3 Encounters:  07/18/20 183 lb (83 kg)  03/23/20 188 lb 12.8 oz (85.6 kg)  02/09/20 190 lb (86.2 kg)    Physical Exam Vitals and nursing note reviewed.  Constitutional:      General: She is not in acute distress.    Appearance: Normal appearance. She is not toxic-appearing.  HENT:     Head: Normocephalic and atraumatic.     Right Ear: Tympanic membrane, ear canal and external ear normal. There is no impacted cerumen.     Left Ear: Tympanic membrane, ear canal and external ear normal. There is no impacted cerumen.     Nose: Nose normal. No congestion.     Mouth/Throat:     Mouth: Mucous membranes are moist.     Pharynx: Oropharynx is clear. No posterior oropharyngeal erythema.   Eyes:     General: No scleral icterus.    Extraocular Movements: Extraocular movements intact.     Pupils: Pupils are equal, round, and reactive to light.  Neck:     Vascular: No carotid bruit.  Cardiovascular:     Rate and Rhythm: Normal rate and regular rhythm.     Heart sounds: Normal heart sounds. No murmur heard.   Pulmonary:     Effort: Pulmonary effort is normal. No respiratory distress.     Breath sounds: Normal breath sounds. No wheezing, rhonchi or rales.  Musculoskeletal:     Cervical back: Normal range of motion.  Skin:    General: Skin is warm and dry.     Capillary Refill: Capillary refill takes less than 2 seconds.     Coloration: Skin is not jaundiced or pale.     Findings: No erythema.  Neurological:     Mental Status: She is alert and oriented to  person, place, and time.     Motor: No weakness.     Gait: Gait normal.  Psychiatric:        Mood and Affect: Mood normal.        Behavior: Behavior normal.        Thought Content: Thought content normal.        Judgment: Judgment normal.     Results for orders placed or performed in visit on 07/18/20  Urinalysis, Routine w reflex microscopic  Result Value Ref Range   Color, Urine YELLOW YELLOW   APPearance CLEAR CLEAR   Specific Gravity, Urine 1.002 1.001 - 1.03   pH 6.5 5.0 - 8.0   Glucose, UA NEGATIVE NEGATIVE   Bilirubin Urine NEGATIVE NEGATIVE   Ketones, ur NEGATIVE NEGATIVE   Hgb urine dipstick TRACE (A) NEGATIVE   Protein, ur NEGATIVE NEGATIVE   Nitrite NEGATIVE NEGATIVE   Leukocytes,Ua TRACE (A) NEGATIVE   WBC, UA NONE SEEN 0 - 5 /HPF   RBC / HPF 0-2 0 - 2 /HPF   Squamous Epithelial / LPF 0-5 < OR = 5 /HPF   Bacteria, UA NONE SEEN NONE SEEN /HPF   Hyaline Cast NONE SEEN NONE SEEN /LPF  Microscopic Message  Result Value Ref Range   Note        Assessment & Plan:  1. Palpitation Acute.  EKG today in clinic was normal.  We will check blood work including blood counts, thyroid hormone,  electrolytes, kidney function, and liver enzymes.  With no chest pain or shortness of breath during episode, unclear etiology today.  Question if changing from Myrbetriq to oxybutynin could be related.  We will follow-up pending blood work.  - Urinalysis, Routine w reflex microscopic - EKG 12-Lead - EKG 12-Lead - CBC with Differential - TSH - COMPLETE METABOLIC PANEL WITH GFR  2. Dizziness Acute.  EKG normal today.  No red flags on examination or in history.  Question if changing from Myrbetriq to oxybutynin is related to dizziness.  Ears flushed today without relief of aural fullness-we will start on Flonase and monitor for symptom improvement.  - CBC with Differential - TSH - COMPLETE METABOLIC PANEL WITH GFR  3. Ear fullness, left Acute.  No red flags in history or on examination.  Ears flushed today in clinic without relief.  Unclear etiology-encourage patient to start on daily Flonase and monitor for symptom relief.  If symptoms do not improve, consider referral to ENT.  - fluticasone (FLONASE) 50 MCG/ACT nasal spray; Place 2 sprays into both nostrils daily.  Dispense: 16 g; Refill: 6  4. Elevated blood pressure reading in office without diagnosis of hypertension Blood pressure elevated above 140/80 today in clinic.  Patient reports blood pressure at home is usually 110s over 70s.  Patient to check blood pressure at home a few times per week and notify us if consistently running greater than 140/90.  Thyroid hormone, kidney function, liver function, and electrolytes checked today.   Follow up plan: Return for Pending blood work.

## 2020-07-19 LAB — CBC WITH DIFFERENTIAL/PLATELET
Absolute Monocytes: 645 cells/uL (ref 200–950)
Basophils Absolute: 68 cells/uL (ref 0–200)
Basophils Relative: 0.9 %
Eosinophils Absolute: 330 cells/uL (ref 15–500)
Eosinophils Relative: 4.4 %
HCT: 43.3 % (ref 35.0–45.0)
Hemoglobin: 14.4 g/dL (ref 11.7–15.5)
Lymphs Abs: 1838 cells/uL (ref 850–3900)
MCH: 29.4 pg (ref 27.0–33.0)
MCHC: 33.3 g/dL (ref 32.0–36.0)
MCV: 88.4 fL (ref 80.0–100.0)
MPV: 9.4 fL (ref 7.5–12.5)
Monocytes Relative: 8.6 %
Neutro Abs: 4620 cells/uL (ref 1500–7800)
Neutrophils Relative %: 61.6 %
Platelets: 310 10*3/uL (ref 140–400)
RBC: 4.9 10*6/uL (ref 3.80–5.10)
RDW: 12.3 % (ref 11.0–15.0)
Total Lymphocyte: 24.5 %
WBC: 7.5 10*3/uL (ref 3.8–10.8)

## 2020-07-19 LAB — COMPLETE METABOLIC PANEL WITH GFR
AG Ratio: 1.9 (calc) (ref 1.0–2.5)
ALT: 18 U/L (ref 6–29)
AST: 13 U/L (ref 10–35)
Albumin: 4.2 g/dL (ref 3.6–5.1)
Alkaline phosphatase (APISO): 55 U/L (ref 37–153)
BUN: 9 mg/dL (ref 7–25)
CO2: 27 mmol/L (ref 20–32)
Calcium: 9.3 mg/dL (ref 8.6–10.4)
Chloride: 105 mmol/L (ref 98–110)
Creat: 0.62 mg/dL (ref 0.50–0.99)
GFR, Est African American: 109 mL/min/{1.73_m2} (ref >=60)
GFR, Est Non African American: 94 mL/min/{1.73_m2} (ref >=60)
Globulin: 2.2 g/dL (calc) (ref 1.9–3.7)
Glucose, Bld: 85 mg/dL (ref 65–99)
Potassium: 4.3 mmol/L (ref 3.5–5.3)
Sodium: 141 mmol/L (ref 135–146)
Total Bilirubin: 0.3 mg/dL (ref 0.2–1.2)
Total Protein: 6.4 g/dL (ref 6.1–8.1)

## 2020-07-19 LAB — TSH: TSH: 0.94 mIU/L (ref 0.40–4.50)

## 2020-07-20 ENCOUNTER — Other Ambulatory Visit: Payer: Self-pay | Admitting: Women's Health

## 2020-07-20 NOTE — Progress Notes (Signed)
Will follow up with ob/gyn

## 2020-07-21 ENCOUNTER — Other Ambulatory Visit: Payer: Self-pay | Admitting: *Deleted

## 2020-07-21 MED ORDER — TRAZODONE HCL 150 MG PO TABS: 150.0000 mg | ORAL_TABLET | Freq: Every day | ORAL | 0 refills | Status: DC

## 2020-07-21 NOTE — Telephone Encounter (Signed)
Left message that Trazodone was refilled for 1 month but going forward, pt should get this from her PCP. The combination of Trazodone and Cymbalta can be problematic per Dr. Despina Hidden. JSY

## 2020-07-24 ENCOUNTER — Other Ambulatory Visit: Payer: Self-pay | Admitting: Adult Health

## 2020-07-27 ENCOUNTER — Telehealth: Payer: Self-pay

## 2020-08-15 NOTE — Telephone Encounter (Signed)
No action required closing to sign encounter 

## 2020-08-28 ENCOUNTER — Other Ambulatory Visit: Payer: Self-pay | Admitting: Adult Health

## 2020-08-30 ENCOUNTER — Other Ambulatory Visit: Payer: Self-pay | Admitting: Nurse Practitioner

## 2020-09-21 DIAGNOSIS — M19019 Primary osteoarthritis, unspecified shoulder: Secondary | ICD-10-CM | POA: Insufficient documentation

## 2020-10-12 ENCOUNTER — Telehealth: Payer: Self-pay

## 2020-10-17 NOTE — Telephone Encounter (Signed)
Closing encounter

## 2020-10-27 ENCOUNTER — Telehealth: Payer: Self-pay

## 2020-10-27 NOTE — Telephone Encounter (Signed)
Health exam cert scanned to pt email as requested

## 2020-10-27 NOTE — Telephone Encounter (Addendum)
Pt health exam certificate form scanned to email as requested by pt to [email or web address]

## 2020-11-08 ENCOUNTER — Ambulatory Visit (INDEPENDENT_AMBULATORY_CARE_PROVIDER_SITE_OTHER): Payer: BC Managed Care – PPO | Admitting: Nurse Practitioner

## 2020-11-08 ENCOUNTER — Other Ambulatory Visit: Payer: Self-pay

## 2020-11-08 VITALS — BP 132/78 | HR 74 | Temp 98.1°F | Ht 65.0 in | Wt 185.8 lb

## 2020-11-08 DIAGNOSIS — G4700 Insomnia, unspecified: Secondary | ICD-10-CM

## 2020-11-08 DIAGNOSIS — M1909 Primary osteoarthritis, other specified site: Secondary | ICD-10-CM

## 2020-11-08 DIAGNOSIS — M19012 Primary osteoarthritis, left shoulder: Secondary | ICD-10-CM

## 2020-11-08 DIAGNOSIS — Z01818 Encounter for other preprocedural examination: Secondary | ICD-10-CM

## 2020-11-08 DIAGNOSIS — K219 Gastro-esophageal reflux disease without esophagitis: Secondary | ICD-10-CM | POA: Diagnosis not present

## 2020-11-08 DIAGNOSIS — F339 Major depressive disorder, recurrent, unspecified: Secondary | ICD-10-CM | POA: Insufficient documentation

## 2020-11-08 DIAGNOSIS — R3589 Other polyuria: Secondary | ICD-10-CM

## 2020-11-08 DIAGNOSIS — R799 Abnormal finding of blood chemistry, unspecified: Secondary | ICD-10-CM

## 2020-11-08 MED ORDER — TRAZODONE HCL 150 MG PO TABS: 150.0000 mg | ORAL_TABLET | Freq: Every day | ORAL | 1 refills | Status: DC

## 2020-11-08 MED ORDER — DULOXETINE HCL 60 MG PO CPEP: 120.0000 mg | ORAL_CAPSULE | Freq: Every day | ORAL | 2 refills | Status: DC

## 2020-11-08 MED ORDER — PANTOPRAZOLE SODIUM 40 MG PO TBEC: 40.0000 mg | DELAYED_RELEASE_TABLET | Freq: Every day | ORAL | 1 refills | Status: AC

## 2020-11-08 MED ORDER — IBUPROFEN 800 MG PO TABS: 800.0000 mg | ORAL_TABLET | Freq: Three times a day (TID) | ORAL | 2 refills | Status: DC | PRN

## 2020-11-08 NOTE — Progress Notes (Signed)
Subjective:    Patient ID: Wendy Reeves, female    DOB: 03-28-1956, 65 y.o.   MRN: [id number]  HPI: Wendy Reeves is a 65 y.o. female presenting for pre-operative clearance.  Chief Complaint  Patient presents with  . Pre-op Exam  . Medication Refill    Trazodone asking for 90 day supply Duloxetine 90 day supply  Ibruprofen 800mg  feel she will need this for post surgery   PRE-OPERATIVE CLEARANCE Surgery: left shoulder repair Surgery date: Dr. ; most likely in October.   Cymbalta - takes 120 mg daily for depression.  No SI/HI.  Lost daughter last September. Has been on Cymbalta  for 9-10 years.  Seems to be working well for her.  Was on Paxil in the past.  She knows her symptoms of depression and when to ask for help.    Ibuprofen 800 - takes up to three times daily.  Sometimes alternates with Tylenol.  Takes with food. Controls the pain in her left shoulder.  Wants to stay away from habit forming medication.  Takes trazodone 150 mg nightly for sleep.  Sometimes if is has been a rough day, she will take 2 tablets, which is a couple of times weekly.  She has taken this for years without issue.  GERD Takes pantoprazole as needed for acid reflux.  Discussed risk of worsening gastritis with use of chronic NSAID. GERD control status: controlled Satisfied with current treatment? yes Heartburn frequency: rare; triggered by certain foods Medication side effects: no  EGD: yes  Allergies  Allergen Reactions  . Elemental Sulfur Hives  . Penicillins Hives    Did it involve swelling of the face/tongue/throat, SOB, or low BP? Unknown Did it involve sudden or severe rash/hives, skin peeling, or any reaction on the inside of your mouth or nose? Unknown Did you need to seek medical attention at a hospital or doctor's office? Yes When did it last happen?More than 50 years ago If all above answers are "NO", may proceed with cephalosporin use.   . Sulfa Antibiotics Hives     Outpatient Encounter Medications as of 11/08/2020  Medication Sig Note  . cycloSPORINE (RESTASIS) 0.05 % ophthalmic emulsion Place 1 drop into both eyes 2 (two) times daily.   . fluticasone (FLONASE) 50 MCG/ACT nasal spray Place 2 sprays into both nostrils daily.   01/08/2021 loratadine (CLARITIN) 10 MG tablet Take 1 tablet (10 mg total) by mouth daily.   Marland Kitchen nystatin-triamcinolone (MYCOLOG II) cream APPLY TO AFFECTED AREA TWICE A DAY   . [DISCONTINUED] ASPIRIN 81 PO Take by mouth.   . [DISCONTINUED] doxycycline (VIBRA-TABS) 100 MG tablet Take 1 tablet (100 mg total) by mouth 2 (two) times daily.   . [DISCONTINUED] DULoxetine (CYMBALTA) 60 MG capsule Take 2 capsules (120 mg total) by mouth daily.   . [DISCONTINUED] ibuprofen (ADVIL,MOTRIN) 800 MG tablet TAKE 1 TABLET BY MOUTH 3 TIMES A DAY AS NEEDED FOR INFLAMATION (Patient taking differently: Take 800 mg by mouth every 8 (eight) hours as needed for moderate pain.) 11/09/2019: Prn   . [DISCONTINUED] oxybutynin (DITROPAN-XL) 10 MG 24 hr tablet Take 1 tablet (10 mg total) by mouth at bedtime.   . [DISCONTINUED] pantoprazole (PROTONIX) 40 MG tablet TAKE 1 TABLET (40 MG TOTAL) BY MOUTH DAILY. 30 MINUTES BEFORE BREAKFAST   . [DISCONTINUED] traZODone (DESYREL) 150 MG tablet Take 1-2 tablets (150-300 mg total) by mouth at bedtime.   . DULoxetine (CYMBALTA) 60 MG capsule Take 2 capsules (120 mg total) by  mouth daily.   Marland Kitchen ibuprofen (ADVIL) 800 MG tablet Take 1 tablet (800 mg total) by mouth every 8 (eight) hours as needed for moderate pain.   . pantoprazole (PROTONIX) 40 MG tablet Take 1 tablet (40 mg total) by mouth daily. 30 minutes before breakfast   . traZODone (DESYREL) 150 MG tablet Take 1-2 tablets (150-300 mg total) by mouth at bedtime.    No facility-administered encounter medications on file as of 11/08/2020.    Patient Active Problem List   Diagnosis Date Noted  . Gastroesophageal reflux disease 11/09/2020  . Insomnia 11/09/2020  . Depression,  recurrent (HCC) 11/08/2020  . Degenerative joint disease of shoulder region 09/21/2020  . Family history of osteoporosis in mother 03/23/2020  . Dyspepsia 03/09/2019  . Dysphagia 03/09/2019  . Superficial fungus infection of skin 02/18/2019  . Abdominal pain 02/18/2019  . Hematuria 02/18/2019  . Sensorineural hearing loss (SNHL) of both ears 01/07/2019  . History of peptic ulcer 02/11/2018  . Osteoarthritis of wrist 01/13/2018  . Primary osteoarthritis of left knee 08/09/2014  . S/P total knee replacement using cement 08/09/2014  . Urinary incontinence 07/21/2012  . Reflux 07/21/2012  . Osteoarthritis of knee 03/17/2012  . Pain in joint, lower leg 07/15/2011  . Stiffness of joint, not elsewhere classified, lower leg 07/15/2011  . Muscle weakness (generalized) 07/15/2011  . Difficulty in walking(719.7) 07/15/2011    Past Medical History:  Diagnosis Date  . Acute frontal sinusitis 03/23/2020  . Arthritis   . Chronic constipation   . Depression   . GERD (gastroesophageal reflux disease)   . H/O hiatal hernia    had surgery  . History of blood transfusion   . PONV (postoperative nausea and vomiting)   . Urinary incontinence 07/21/2012    Relevant past medical, surgical, family and social history reviewed and updated as indicated. Interim medical history since our last visit reviewed.  Review of Systems  Constitutional: Negative.  Negative for activity change, appetite change, fatigue and fever.  Respiratory: Negative.  Negative for chest tightness, shortness of breath and wheezing.   Cardiovascular: Negative.  Negative for chest pain, palpitations and leg swelling.  Musculoskeletal:  Positive for arthralgias (left shoulder).  Skin: Negative.   Neurological: Negative.   Psychiatric/Behavioral: Negative.  Negative for sleep disturbance. The patient is not nervous/anxious.   Per HPI unless specifically indicated above     Objective:    BP 132/78   Pulse 74   Temp 98.1 F  (36.7 C)   Ht 5\' 5"  (1.651 m)   Wt 185 lb 12.8 oz (84.3 kg)   SpO2 97%   BMI 30.92 kg/m   Wt Readings from Last 3 Encounters:  11/08/20 185 lb 12.8 oz (84.3 kg)  07/18/20 183 lb (83 kg)  03/23/20 188 lb 12.8 oz (85.6 kg)    Physical Exam Vitals and nursing note reviewed.  Constitutional:      General: She is not in acute distress.    Appearance: Normal appearance. She is not toxic-appearing.  Eyes:     General: No scleral icterus.    Extraocular Movements: Extraocular movements intact.  Neck:     Vascular: No carotid bruit.  Cardiovascular:     Rate and Rhythm: Normal rate and regular rhythm.     Heart sounds: Normal heart sounds. No murmur heard. Pulmonary:     Effort: Pulmonary effort is normal. No respiratory distress.     Breath sounds: Normal breath sounds. No wheezing, rhonchi or rales.  Musculoskeletal:  Right shoulder: Normal.     Left shoulder: Decreased range of motion.     Right lower leg: No edema.     Left lower leg: No edema.  Skin:    General: Skin is warm and dry.     Capillary Refill: Capillary refill takes less than 2 seconds.     Coloration: Skin is not jaundiced or pale.     Findings: No erythema.  Neurological:     Mental Status: She is alert and oriented to person, place, and time.     Motor: No weakness.     Gait: Gait normal.  Psychiatric:        Mood and Affect: Mood normal.        Behavior: Behavior normal.        Thought Content: Thought content normal.        Judgment: Judgment normal.      Assessment & Plan:   Problem List Items Addressed This Visit       Digestive   Gastroesophageal reflux disease    Chronic.  Last EGD 2020 showed gastritis likely due to NSAID use.  Discussed importance of taking PPI daily to prevent further gastritis.  Will check CBC today.      Relevant Medications   pantoprazole (PROTONIX) 40 MG tablet     Musculoskeletal and Integument   Degenerative joint disease of shoulder region    Acute.   Surgery is being scheduled.  Discussed risk first benefit of continuing ibuprofen-history of gastritis.  Encouraged daily use of PPI to prevent worsening GI symptoms and ulcer.  Will clear for surgery pending preop blood work-EKG is normal today.      Relevant Medications   ibuprofen (ADVIL) 800 MG tablet     Other   Insomnia    Chronic, stable.  Currently taking trazodone 150 mg to 300 mg nightly to help sleep.  With also recurrent depression- stable with Cymbalta.  We will plan to continue these medications for now. Follow up in 6 months.      Relevant Medications   traZODone (DESYREL) 150 MG tablet   Depression, recurrent (HCC)    Chronic, stable with high-dose Cymbalta.  She reports her mood is stable today-we will continue.  Follow-up in 6 months or if any new concerns in the meantime or if mood worsens.      Relevant Medications   traZODone (DESYREL) 150 MG tablet   DULoxetine (CYMBALTA) 60 MG capsule   Other Visit Diagnoses     Pre-op exam    -  Primary   Relevant Orders   CBC with Differential/Platelet (Completed)   Protime-INR (Completed)   Hemoglobin A1c (Completed)   Urinalysis, Routine w reflex microscopic (Completed)   COMPLETE METABOLIC PANEL WITH GFR (Completed)   EKG 12-Lead (Completed)   Primary osteoarthritis, other specified site       left shoulder, refill of ibuprofen given while awaiting surgery   Relevant Medications   ibuprofen (ADVIL) 800 MG tablet   Other Relevant Orders   Protime-INR (Completed)   Polyuria       Check HgbA1c to rule out diabetes   Abnormal finding of blood chemistry, unspecified        Relevant Orders   Hemoglobin A1c (Completed)        Follow up plan: Return in about 6 months (around 05/11/2021) for follow up.

## 2020-11-09 ENCOUNTER — Encounter: Payer: Self-pay | Admitting: Nurse Practitioner

## 2020-11-09 ENCOUNTER — Other Ambulatory Visit: Payer: Self-pay

## 2020-11-09 ENCOUNTER — Telehealth: Payer: Self-pay

## 2020-11-09 DIAGNOSIS — K219 Gastro-esophageal reflux disease without esophagitis: Secondary | ICD-10-CM | POA: Insufficient documentation

## 2020-11-09 DIAGNOSIS — M1909 Primary osteoarthritis, other specified site: Secondary | ICD-10-CM

## 2020-11-09 DIAGNOSIS — G4700 Insomnia, unspecified: Secondary | ICD-10-CM | POA: Insufficient documentation

## 2020-11-09 LAB — URINALYSIS, ROUTINE W REFLEX MICROSCOPIC
Bacteria, UA: NONE SEEN /HPF
Bilirubin Urine: NEGATIVE
Glucose, UA: NEGATIVE
Hgb urine dipstick: NEGATIVE
Hyaline Cast: NONE SEEN /LPF
Ketones, ur: NEGATIVE
Nitrite: NEGATIVE
Protein, ur: NEGATIVE
RBC / HPF: NONE SEEN /HPF (ref 0–2)
Specific Gravity, Urine: 1.019 (ref 1.001–1.035)
Squamous Epithelial / HPF: NONE SEEN /HPF (ref <=5)
WBC, UA: NONE SEEN /HPF (ref 0–5)
pH: 5.5 (ref 5.0–8.0)

## 2020-11-09 LAB — COMPLETE METABOLIC PANEL WITH GFR
AG Ratio: 2 (calc) (ref 1.0–2.5)
ALT: 15 U/L (ref 6–29)
AST: 13 U/L (ref 10–35)
Albumin: 4.4 g/dL (ref 3.6–5.1)
Alkaline phosphatase (APISO): 60 U/L (ref 37–153)
BUN: 13 mg/dL (ref 7–25)
CO2: 28 mmol/L (ref 20–32)
Calcium: 9.4 mg/dL (ref 8.6–10.4)
Chloride: 105 mmol/L (ref 98–110)
Creat: 0.7 mg/dL (ref 0.50–1.05)
Globulin: 2.2 g/dL (calc) (ref 1.9–3.7)
Glucose, Bld: 103 mg/dL — ABNORMAL HIGH (ref 65–99)
Potassium: 3.9 mmol/L (ref 3.5–5.3)
Sodium: 141 mmol/L (ref 135–146)
Total Bilirubin: 0.3 mg/dL (ref 0.2–1.2)
Total Protein: 6.6 g/dL (ref 6.1–8.1)
eGFR: 95 mL/min/{1.73_m2} (ref >=60)

## 2020-11-09 LAB — PROTIME-INR
INR: 1
Prothrombin Time: 9.8 s (ref 9.0–11.5)

## 2020-11-09 LAB — CBC WITH DIFFERENTIAL/PLATELET
Absolute Monocytes: 578 cells/uL (ref 200–950)
Basophils Absolute: 61 cells/uL (ref 0–200)
Basophils Relative: 0.9 %
Eosinophils Absolute: 408 cells/uL (ref 15–500)
Eosinophils Relative: 6 %
HCT: 41 % (ref 35.0–45.0)
Hemoglobin: 14 g/dL (ref 11.7–15.5)
Lymphs Abs: 1700 cells/uL (ref 850–3900)
MCH: 30 pg (ref 27.0–33.0)
MCHC: 34.1 g/dL (ref 32.0–36.0)
MCV: 88 fL (ref 80.0–100.0)
MPV: 9.6 fL (ref 7.5–12.5)
Monocytes Relative: 8.5 %
Neutro Abs: 4053 cells/uL (ref 1500–7800)
Neutrophils Relative %: 59.6 %
Platelets: 294 10*3/uL (ref 140–400)
RBC: 4.66 10*6/uL (ref 3.80–5.10)
RDW: 11.9 % (ref 11.0–15.0)
Total Lymphocyte: 25 %
WBC: 6.8 10*3/uL (ref 3.8–10.8)

## 2020-11-09 LAB — HEMOGLOBIN A1C
Hgb A1c MFr Bld: 5.3 % of total Hgb (ref <5.7)
Mean Plasma Glucose: 105 mg/dL
eAG (mmol/L): 5.8 mmol/L

## 2020-11-09 LAB — MICROSCOPIC MESSAGE

## 2020-11-09 MED ORDER — IBUPROFEN 800 MG PO TABS: 800.0000 mg | ORAL_TABLET | Freq: Three times a day (TID) | ORAL | 2 refills | Status: DC | PRN

## 2020-11-09 NOTE — Assessment & Plan Note (Signed)
Acute.  Surgery is being scheduled.  Discussed risk first benefit of continuing ibuprofen-history of gastritis.  Encouraged daily use of PPI to prevent worsening GI symptoms and ulcer.  Will clear for surgery pending preop blood work-EKG is normal today.

## 2020-11-09 NOTE — Assessment & Plan Note (Signed)
Chronic.  Last EGD 2020 showed gastritis likely due to NSAID use.  Discussed importance of taking PPI daily to prevent further gastritis.  Will check CBC today.

## 2020-11-09 NOTE — Assessment & Plan Note (Signed)
Chronic, stable.  Currently taking trazodone 150 mg to 300 mg nightly to help sleep.  With also recurrent depression- stable with Cymbalta.  We will plan to continue these medications for now. Follow up in 6 months.

## 2020-11-09 NOTE — Telephone Encounter (Signed)
Pt sent rx for advil 800mg 

## 2020-11-09 NOTE — Assessment & Plan Note (Signed)
Chronic, stable with high-dose Cymbalta.  She reports her mood is stable today-we will continue.  Follow-up in 6 months or if any new concerns in the meantime or if mood worsens.

## 2020-11-09 NOTE — Telephone Encounter (Signed)
Faxed pre-op eval form along with ov and labs to [facility], Attn to [facility]. Fax:[phone]

## 2020-11-25 ENCOUNTER — Other Ambulatory Visit: Payer: Self-pay | Admitting: Adult Health

## 2020-11-28 ENCOUNTER — Other Ambulatory Visit: Payer: Self-pay | Admitting: Adult Health

## 2020-12-14 ENCOUNTER — Other Ambulatory Visit: Payer: Self-pay | Admitting: Adult Health

## 2021-01-05 NOTE — Progress Notes (Signed)
Please enter orders for PAT visit scheduled for 01-19-21 

## 2021-01-09 ENCOUNTER — Other Ambulatory Visit: Payer: Self-pay | Admitting: Adult Health

## 2021-01-09 DIAGNOSIS — Z1231 Encounter for screening mammogram for malignant neoplasm of breast: Secondary | ICD-10-CM

## 2021-01-10 NOTE — H&P (Signed)
Patient's anticipated LOS is less than 2 midnights, meeting these requirements: - Younger than 7 - Lives within 1 hour of care - Has a competent adult at home to recover with post-op recover - NO history of  - Chronic pain requiring opiods  - Diabetes  - Coronary Artery Disease  - Heart failure  - Heart attack  - Stroke  - DVT/VTE  - Cardiac arrhythmia  - Respiratory Failure/COPD  - Renal failure  - Anemia  - Advanced Liver disease     Wendy Reeves is an 65 y.o. female.    Chief Complaint: left shoulder pain  HPI: Pt is a 66 y.o. female complaining of left shoulder pain for multiple years. Pain had continually increased since the beginning. X-rays in the clinic show end-stage arthritic changes of the left shoulder. Pt has tried various conservative treatments which have failed to alleviate their symptoms, including injections and therapy. Various options are discussed with the patient. Risks, benefits and expectations were discussed with the patient. Patient understand the risks, benefits and expectations and wishes to proceed with surgery.   PCP:  Wendy Nose, NP  D/C Plans: Home  PMH: Past Medical History:  Diagnosis Date   Acute frontal sinusitis 03/23/2020   Arthritis    Chronic constipation    Depression    GERD (gastroesophageal reflux disease)    H/O hiatal hernia    had surgery   History of blood transfusion    PONV (postoperative nausea and vomiting)    Urinary incontinence 07/21/2012    PSH: Past Surgical History:  Procedure Laterality Date   ABDOMINAL HYSTERECTOMY     APPENDECTOMY  1982   BIOPSY  03/30/2019   Procedure: BIOPSY;  Surgeon: Wendy Reeves;  [facility];  Service: Endoscopy;;  gastric    CATARACT EXTRACTION, BILATERAL     CESAREAN SECTION     CHOLECYSTECTOMY     ESOPHAGOGASTRODUODENOSCOPY (EGD) WITH PROPOFOL N/A 03/30/2019   Schatzki ring s/p dilation, moderate erosive NSAID gastritis s/p biopsy    EYE  SURGERY N/A    Phreesia 11/06/2019   HERNIA REPAIR N/A    Phreesia 03/22/2020   HIATAL HERNIA REPAIR     JOINT REPLACEMENT N/A    Phreesia 11/06/2019   ROTATOR CUFF REPAIR     SAVORY DILATION N/A 03/30/2019   Procedure: SAVORY DILATION;  Surgeon: Wendy Reeves;  [facility];  Service: Endoscopy;  Laterality: N/A;   TOTAL KNEE ARTHROPLASTY  03/17/2012   Procedure: TOTAL KNEE ARTHROPLASTY;  Surgeon: Wendy Reeves;  Location: [facility] OR;  Service: Orthopedics;  Laterality: Right;  RIGHT TOTAL ARTHROPLASTY   TOTAL KNEE ARTHROPLASTY Left 08/09/2014   Procedure: TOTAL KNEE ARTHROPLASTY;  Surgeon: Wendy Reeves;  Location: [facility] OR;  Service: Orthopedics;  Laterality: Left;    Social History:  reports that she has never smoked. She has never used smokeless tobacco. She reports current alcohol use. She reports that she does not use drugs.  Allergies:  Allergies  Allergen Reactions   Elemental Sulfur Hives   Penicillins Hives    Did it involve swelling of the face/tongue/throat, SOB, or low BP? Unknown Did it involve sudden or severe rash/hives, skin peeling, or any reaction on the inside of your mouth or Reeves? Unknown Did you need to seek medical attention at a hospital or doctor's office? Yes When did it last happen?More than 50 years ago If all above answers are "NO", may proceed with  cephalosporin use.    Sulfa Antibiotics Hives    Medications: No current facility-administered medications for this encounter.   Current Outpatient Medications  Medication Sig Dispense Refill   cycloSPORINE (RESTASIS) 0.05 % ophthalmic emulsion Place 1 drop into both eyes 2 (two) times daily.     DULoxetine (CYMBALTA) 60 MG capsule Take 2 capsules (120 mg total) by mouth daily. 180 capsule 2   fluticasone (FLONASE) 50 MCG/ACT nasal spray Place 2 sprays into both nostrils daily. 16 g 6   ibuprofen (ADVIL) 800 MG tablet Take 1 tablet (800 mg total) by mouth every 8 (eight) hours  as needed for moderate pain. 90 tablet 2   loratadine (CLARITIN) 10 MG tablet Take 1 tablet (10 mg total) by mouth daily. 90 tablet 3   nystatin-triamcinolone (MYCOLOG II) cream APPLY TO AFFECTED AREA TWICE A DAY 30 g 3   pantoprazole (PROTONIX) 40 MG tablet Take 1 tablet (40 mg total) by mouth daily. 30 minutes before breakfast 90 tablet 1   traZODone (DESYREL) 150 MG tablet Take 1-2 tablets (150-300 mg total) by mouth at bedtime. 180 tablet 1    No results found for this or any previous visit (from the past 48 hour(s)). No results found.  ROS: Pain with rom of the left upper extremity  Physical Exam: Alert and oriented 65 y.o. female in no acute distress Cranial nerves 2-12 intact Cervical spine: full rom with no tenderness, nv intact distally Chest: active breath sounds bilaterally, no wheeze rhonchi or rales Heart: regular rate and rhythm, no murmur Abd: non tender non distended with active bowel sounds Hip is stable with rom  Left shoulder painful and weak rom Nv intact distally No rashes or edema   Assessment/Plan Assessment: left shoulder cuff arthropathy  Plan:  Patient will undergo a left reverse total shoulder by Dr. Ranell Reeves at [facility] Risks benefits and expectations were discussed with the patient. Patient understand risks, benefits and expectations and wishes to proceed. Preoperative templating of the joint replacement has been completed, documented, and submitted to the Operating Room personnel in order to optimize intra-operative equipment management.   Alphonsa Overall PA-C, MPAS [facility] is now [employer] [address], [address], [place], [place] [address] Phone: [phone] [email or web address] Facebook  [employer]

## 2021-01-18 NOTE — Patient Instructions (Signed)
DUE TO COVID-19 ONLY ONE VISITOR IS ALLOWED TO COME WITH YOU AND STAY IN THE WAITING ROOM ONLY DURING PRE OP AND PROCEDURE.   **NO VISITORS ARE ALLOWED IN THE SHORT STAY AREA OR RECOVERY ROOM!!**  IF YOU WILL BE ADMITTED INTO THE HOSPITAL YOU ARE ALLOWED ONLY TWO SUPPORT PEOPLE DURING VISITATION HOURS ONLY (10AM -8PM)   The support person(s) may change daily. The support person(s) must pass our screening, gel in and out, and wear a mask at all times, including in the patient's room. Patients must also wear a mask when staff or their support person are in the room.  No visitors under the age of 19. Any visitor under the age of 34 must be accompanied by an adult.    COVID SWAB TESTING MUST BE COMPLETED ON:  02/24/21 **MUST PRESENT COMPLETED FORM AT TESTING SITE**    [address] [place] (backside of the building) You are not required to quarantine, however you are required to wear a well-fitted mask when you are out and around people not in your household.  Hand Hygiene often Do NOT share personal items Notify your provider if you are in close contact with someone who has COVID or you develop fever 100.4 or greater, new onset of sneezing, cough, sore throat, shortness of breath or body aches.  [facility] Entrance [address], [address], must go inside of the hospital, NOT A DRIVE THRU!  (Must self quarantine after testing. Follow instructions on handout.)       Your procedure is scheduled on:    Report to [facility] Main Entrance    Report to admitting at : 6:45 AM   Call this number if you have problems the morning of surgery [phone]   Do not eat food :After Midnight.   May have liquids until : 6:30 AM   day of surgery  CLEAR LIQUID DIET  Foods Allowed                                                                     Foods Excluded  Water, Black Coffee and tea, regular and decaf                              liquids that you cannot  Plain Jell-O in any flavor  (No red)                                           see through such as: Fruit ices (not with fruit pulp)                                     milk, soups, orange juice              Iced Popsicles (No red)  All solid food                                   Apple juices Sports drinks like Gatorade (No red) Lightly seasoned clear broth or consume(fat free) Sugar,   Sample Menu Breakfast                                Lunch                                     Supper Cranberry juice                    Beef broth                            Chicken broth Jell-O                                     Grape juice                           Apple juice Coffee or tea                        Jell-O                                      Popsicle                                                Coffee or tea                        Coffee or tea      Complete one Ensure drink the morning of surgery at       the day of surgery.    The day of surgery:  Drink ONE (1) Pre-Surgery Clear Ensure or G2 by am the morning of surgery. Drink in one sitting. Do not sip.  This drink was given to you during your hospital  pre-op appointment visit. Nothing else to drink after completing the  Pre-Surgery Clear Ensure or G2.          If you have questions, please contact your surgeon's office.     Oral Hygiene is also important to reduce your risk of infection.                                    Remember - BRUSH YOUR TEETH THE MORNING OF SURGERY WITH YOUR REGULAR TOOTHPASTE   Do NOT smoke after Midnight   Take these medicines the morning of surgery with A SIP OF WATER: duloxetine,pantoprazole.Loratadine as needed.  DO NOT TAKE ANY ORAL DIABETIC MEDICATIONS DAY OF YOUR SURGERY  You may not have any metal on your body including hair pins, jewelry, and body piercing             Do not wear make-up, lotions,  powders, perfumes/cologne, or deodorant  Do not wear nail polish including gel and S&S, artificial/acrylic nails, or any other type of covering on natural nails including finger and toenails. If you have artificial nails, gel coating, etc. that needs to be removed by a nail salon please have this removed prior to surgery or surgery may need to be canceled/ delayed if the surgeon/ anesthesia feels like they are unable to be safely monitored.   Do not shave  48 hours prior to surgery.    Do not bring valuables to the hospital. [place].   Contacts, dentures or bridgework may not be worn into surgery.   Bring small overnight bag day of surgery.    Patients discharged on the day of surgery will not be allowed to drive home.   Special Instructions: Bring a copy of your healthcare power of attorney and living will documents         the day of surgery if you haven't scanned them before.              Please read over the following fact sheets you were given: IF YOU HAVE QUESTIONS ABOUT YOUR PRE-OP INSTRUCTIONS PLEASE CALL [phone]   [facility] - Preparing for Surgery Before surgery, you can play an important role.  Because skin is not sterile, your skin needs to be as free of germs as possible.  You can reduce the number of germs on your skin by washing with CHG (chlorahexidine gluconate) soap before surgery.  CHG is an antiseptic cleaner which kills germs and bonds with the skin to continue killing germs even after washing. Please DO NOT use if you have an allergy to CHG or antibacterial soaps.  If your skin becomes reddened/irritated stop using the CHG and inform your nurse when you arrive at Short Stay. Do not shave (including legs and underarms) for at least 48 hours prior to the first CHG shower.  You may shave your face/neck. Please follow these instructions carefully:  1.  Shower with CHG Soap the night before surgery and the  morning of  Surgery.  2.  If you choose to wash your hair, wash your hair first as usual with your  normal  shampoo.  3.  After you shampoo, rinse your hair and body thoroughly to remove the  shampoo.                           4.  Use CHG as you would any other liquid soap.  You can apply chg directly  to the skin and wash                       Gently with a scrungie or clean washcloth.  5.  Apply the CHG Soap to your body ONLY FROM THE NECK DOWN.   Do not use on face/ open                           Wound or open sores. Avoid contact with eyes, ears mouth and genitals (private parts).  Wash face,  Genitals (private parts) with your normal soap.             6.  Wash thoroughly, paying special attention to the area where your surgery  will be performed.  7.  Thoroughly rinse your body with warm water from the neck down.  8.  DO NOT shower/wash with your normal soap after using and rinsing off  the CHG Soap.                9.  Pat yourself dry with a clean towel.            10.  Wear clean pajamas.            11.  Place clean sheets on your bed the night of your first shower and do not  sleep with pets. Day of Surgery : Do not apply any lotions/deodorants the morning of surgery.  Please wear clean clothes to the hospital/surgery center.  FAILURE TO FOLLOW THESE INSTRUCTIONS MAY RESULT IN THE CANCELLATION OF YOUR SURGERY PATIENT SIGNATURE_________________________________  NURSE SIGNATURE__________________________________  ________________________________________________________________________  [facility]- Preparing for Total Shoulder Arthroplasty    Before surgery, you can play an important role. Because skin is not sterile, your skin needs to be as free of germs as possible. You can reduce the number of germs on your skin by using the following products. Benzoyl Peroxide Gel Reduces the number of germs present on the skin Applied twice a day to shoulder area starting two days  before surgery    ==================================================================  Please follow these instructions carefully:  BENZOYL PEROXIDE 5% GEL  Please do not use if you have an allergy to benzoyl peroxide.   If your skin becomes reddened/irritated stop using the benzoyl peroxide.  Starting two days before surgery, apply as follows: Apply benzoyl peroxide in the morning and at night. Apply after taking a shower. If you are not taking a shower clean entire shoulder front, back, and side along with the armpit with a clean wet washcloth.  Place a quarter-sized dollop on your shoulder and rub in thoroughly, making sure to cover the front, back, and side of your shoulder, along with the armpit.   2 days before ____ AM   ____ PM              1 day before ____ AM   ____ PM                         Do this twice a day for two days.  (Last application is the night before surgery, AFTER using the CHG soap as described below).  Do NOT apply benzoyl peroxide gel on the day of surgery.  Incentive Spirometer  An incentive spirometer is a tool that can help keep your lungs clear and active. This tool measures how well you are filling your lungs with each breath. Taking long deep breaths may help reverse or decrease the chance of developing breathing (pulmonary) problems (especially infection) following: A long period of time when you are unable to move or be active. BEFORE THE PROCEDURE  If the spirometer includes an indicator to show your best effort, your nurse or respiratory therapist will set it to a desired goal. If possible, sit up straight or lean slightly forward. Try not to slouch. Hold the incentive spirometer in an upright position. INSTRUCTIONS FOR USE  Sit on the edge of your bed if possible, or sit up as far  as you can in bed or on a chair. Hold the incentive spirometer in an upright position. Breathe out normally. Place the mouthpiece in your mouth and seal your lips tightly  around it. Breathe in slowly and as deeply as possible, raising the piston or the ball toward the top of the column. Hold your breath for 3-5 seconds or for as long as possible. Allow the piston or ball to fall to the bottom of the column. Remove the mouthpiece from your mouth and breathe out normally. Rest for a few seconds and repeat Steps 1 through 7 at least 10 times every 1-2 hours when you are awake. Take your time and take a few normal breaths between deep breaths. The spirometer may include an indicator to show your best effort. Use the indicator as a goal to work toward during each repetition. After each set of 10 deep breaths, practice coughing to be sure your lungs are clear. If you have an incision (the cut made at the time of surgery), support your incision when coughing by placing a pillow or rolled up towels firmly against it. Once you are able to get out of bed, walk around indoors and cough well. You may stop using the incentive spirometer when instructed by your caregiver.  RISKS AND COMPLICATIONS Take your time so you do not get dizzy or light-headed. If you are in pain, you may need to take or ask for pain medication before doing incentive spirometry. It is harder to take a deep breath if you are having pain. AFTER USE Rest and breathe slowly and easily. It can be helpful to keep track of a log of your progress. Your caregiver can provide you with a simple table to help with this. If you are using the spirometer at home, follow these instructions: SEEK MEDICAL CARE IF:  You are having difficultly using the spirometer. You have trouble using the spirometer as often as instructed. Your pain medication is not giving enough relief while using the spirometer. You develop fever of 100.5 F (38.1 C) or higher. SEEK IMMEDIATE MEDICAL CARE IF:  You cough up bloody sputum that had not been present before. You develop fever of 102 F (38.9 C) or greater. You develop worsening pain  at or near the incision site. MAKE SURE YOU:  Understand these instructions. Will watch your condition. Will get help right away if you are not doing well or get worse. Document Released: 07/29/2006 Document Revised: 06/10/2011 Document Reviewed: 09/29/2006 [facility] Patient Information 2014 [place], [place].   ________________________________________________________________________

## 2021-01-19 ENCOUNTER — Encounter (HOSPITAL_COMMUNITY)
Admission: RE | Admit: 2021-01-19 | Discharge: 2021-01-19 | Disposition: A | Discharge status: Home / Self Care | Payer: Medicare Other | Source: Ambulatory Visit | Attending: Orthopedic Surgery | Admitting: Orthopedic Surgery

## 2021-01-19 ENCOUNTER — Other Ambulatory Visit: Payer: Self-pay

## 2021-01-19 ENCOUNTER — Encounter (HOSPITAL_COMMUNITY): Payer: Self-pay

## 2021-01-19 VITALS — BP 116/80 | HR 70 | Temp 98.3°F | Ht 65.0 in | Wt 181.0 lb

## 2021-01-19 DIAGNOSIS — K219 Gastro-esophageal reflux disease without esophagitis: Secondary | ICD-10-CM | POA: Diagnosis not present

## 2021-01-19 DIAGNOSIS — M19012 Primary osteoarthritis, left shoulder: Secondary | ICD-10-CM | POA: Diagnosis not present

## 2021-01-19 DIAGNOSIS — Z01812 Encounter for preprocedural laboratory examination: Secondary | ICD-10-CM | POA: Diagnosis not present

## 2021-01-19 DIAGNOSIS — Z79899 Other long term (current) drug therapy: Secondary | ICD-10-CM | POA: Insufficient documentation

## 2021-01-19 DIAGNOSIS — Z01818 Encounter for other preprocedural examination: Secondary | ICD-10-CM

## 2021-01-19 LAB — SURGICAL PCR SCREEN
MRSA, PCR: NEGATIVE
Staphylococcus aureus: NEGATIVE

## 2021-01-19 LAB — CBC
HCT: 42.1 % (ref 36.0–46.0)
Hemoglobin: 13.7 g/dL (ref 12.0–15.0)
MCH: 29.7 pg (ref 26.0–34.0)
MCHC: 32.5 g/dL (ref 30.0–36.0)
MCV: 91.1 fL (ref 80.0–100.0)
Platelets: 288 10*3/uL (ref 150–400)
RBC: 4.62 MIL/uL (ref 3.87–5.11)
RDW: 12.5 % (ref 11.5–15.5)
WBC: 8.1 10*3/uL (ref 4.0–10.5)
nRBC: 0 % (ref 0.0–0.2)

## 2021-01-19 NOTE — Progress Notes (Signed)
COVID Vaccine Completed: Yes Date COVID Vaccine completed: 03/20/20 COVID vaccine manufacturer: Pfizer  x 3  COVID Test: 01/24/21 PCP - Cathlean Marseilles: NP Cardiologist -   Chest x-ray -  EKG -  Stress Test -  ECHO -  Cardiac Cath -  Pacemaker/ICD device last checked:  Sleep Study -  CPAP -   Fasting Blood Sugar -  Checks Blood Sugar _____ times a day  Blood Thinner Instructions: Aspirin Instructions: Last Dose:  Anesthesia review:   Patient denies shortness of breath, fever, cough and chest pain at PAT appointment   Patient verbalized understanding of instructions that were given to them at the PAT appointment. Patient was also instructed that they will need to review over the PAT instructions again at home before surgery.

## 2021-01-22 ENCOUNTER — Other Ambulatory Visit: Payer: Self-pay | Admitting: Nurse Practitioner

## 2021-01-22 DIAGNOSIS — M1909 Primary osteoarthritis, other specified site: Secondary | ICD-10-CM

## 2021-01-22 NOTE — Progress Notes (Signed)
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 never smoker with h/o PONV, GERD, left shoulder OA scheduled for above procedure 01/26/2021 with Dr. Beverely Low.   Pt seen by PCP 11/08/2020 for preoperative evaluation.  Stable at this visit.   Anticipate pt can proceed with planned procedure barring acute status change.   VS: BP 116/80   Pulse 70   Temp 36.8 C (Oral)   Ht 5\' 5"  (1.651 m)   Wt 82.1 kg   SpO2 100%   BMI 30.12 kg/m   PROVIDERS: , NP is PCP    LABS: Labs reviewed: Acceptable for surgery. (all labs ordered are listed, but only abnormal results are displayed)  Labs Reviewed  SURGICAL PCR SCREEN  CBC     IMAGES:   EKG: 11/08/2020 Rate 66 bpm    CV:  Past Medical History:  Diagnosis Date   Acute frontal sinusitis 03/23/2020   Arthritis    Chronic constipation    Depression    GERD (gastroesophageal reflux disease)    H/O hiatal hernia    had surgery   History of blood transfusion    PONV (postoperative nausea and vomiting)    Urinary incontinence 07/21/2012    Past Surgical History:  Procedure Laterality Date   ABDOMINAL HYSTERECTOMY     APPENDECTOMY  1982   BIOPSY  03/30/2019   Procedure: BIOPSY;  Surgeon: 04/01/2019, MD;  [facility];  Service: Endoscopy;;  gastric    CATARACT EXTRACTION, BILATERAL     CESAREAN SECTION     CHOLECYSTECTOMY     ESOPHAGOGASTRODUODENOSCOPY (EGD) WITH PROPOFOL N/A 03/30/2019   Schatzki ring s/p dilation, moderate erosive NSAID gastritis s/p biopsy    EYE SURGERY N/A    Phreesia 11/06/2019   HERNIA REPAIR N/A    Phreesia 03/22/2020   HIATAL HERNIA REPAIR     JOINT REPLACEMENT N/A     Phreesia 11/06/2019   ROTATOR CUFF REPAIR     SAVORY DILATION N/A 03/30/2019   Procedure: SAVORY DILATION;  Surgeon: 04/01/2019, MD;  [facility];  Service: Endoscopy;  Laterality: N/A;   TOTAL KNEE ARTHROPLASTY  03/17/2012   Procedure: TOTAL KNEE ARTHROPLASTY;  Surgeon: 03/19/2012, MD;  Location: [facility] OR;  Service: Orthopedics;  Laterality: Right;  RIGHT TOTAL ARTHROPLASTY   TOTAL KNEE ARTHROPLASTY Left 08/09/2014   Procedure: TOTAL KNEE ARTHROPLASTY;  Surgeon: 10/09/2014, MD;  Location: [facility] OR;  Service: Orthopedics;  Laterality: Left;    MEDICATIONS:  Ascorbic Acid (VITAMIN C WITH ROSE HIPS) 1000 MG tablet   Carboxymethylcellul-Glycerin (LUBRICATING EYE DROPS OP)   cycloSPORINE (RESTASIS) 0.05 % ophthalmic emulsion   DULoxetine (CYMBALTA) 60 MG capsule   famotidine (PEPCID) 20 MG tablet   fluticasone (FLONASE) 50 MCG/ACT nasal spray   ibuprofen (ADVIL) 800 MG tablet   loratadine (CLARITIN) 10 MG tablet   Multiple Minerals-Vitamins (CITRACAL MAXIMUM PLUS) TABS   nystatin-triamcinolone (MYCOLOG II) cream   oxybutynin (DITROPAN-XL) 10 MG 24 hr tablet   pantoprazole (PROTONIX) 40 MG tablet   traZODone (DESYREL) 150 MG tablet   No current facility-administered medications for this encounter.    [facility] Ward, PA-C  [facility] Pre-Surgical Testing [phone]

## 2021-01-24 ENCOUNTER — Other Ambulatory Visit: Payer: Self-pay | Admitting: Orthopedic Surgery

## 2021-01-25 LAB — SARS CORONAVIRUS 2 (TAT 6-24 HRS): SARS Coronavirus 2: NEGATIVE

## 2021-01-26 ENCOUNTER — Encounter (HOSPITAL_COMMUNITY): Payer: Self-pay | Admitting: Orthopedic Surgery

## 2021-01-26 ENCOUNTER — Other Ambulatory Visit: Payer: Self-pay

## 2021-01-26 ENCOUNTER — Ambulatory Visit (HOSPITAL_COMMUNITY): Payer: Medicare Other | Admitting: Physician Assistant

## 2021-01-26 ENCOUNTER — Observation Stay (HOSPITAL_COMMUNITY)
Admission: RE | Admit: 2021-01-26 | Discharge: 2021-01-27 | Disposition: A | Discharge status: Home / Self Care | Payer: Medicare Other | Attending: Orthopedic Surgery | Admitting: Orthopedic Surgery

## 2021-01-26 ENCOUNTER — Observation Stay (HOSPITAL_COMMUNITY): Payer: Medicare Other

## 2021-01-26 ENCOUNTER — Encounter (HOSPITAL_COMMUNITY)
Admission: RE | Disposition: A | Discharge status: Home / Self Care | Payer: Self-pay | Source: Home / Self Care | Attending: Orthopedic Surgery

## 2021-01-26 ENCOUNTER — Ambulatory Visit (HOSPITAL_COMMUNITY): Payer: Medicare Other | Admitting: Certified Registered Nurse Anesthetist

## 2021-01-26 DIAGNOSIS — M75102 Unspecified rotator cuff tear or rupture of left shoulder, not specified as traumatic: Secondary | ICD-10-CM | POA: Diagnosis not present

## 2021-01-26 DIAGNOSIS — Z96653 Presence of artificial knee joint, bilateral: Secondary | ICD-10-CM | POA: Diagnosis not present

## 2021-01-26 DIAGNOSIS — M19012 Primary osteoarthritis, left shoulder: Principal | ICD-10-CM | POA: Insufficient documentation

## 2021-01-26 DIAGNOSIS — Z96612 Presence of left artificial shoulder joint: Secondary | ICD-10-CM

## 2021-01-26 DIAGNOSIS — Z79899 Other long term (current) drug therapy: Secondary | ICD-10-CM | POA: Diagnosis not present

## 2021-01-26 HISTORY — PX: REVERSE SHOULDER ARTHROPLASTY: SHX5054

## 2021-01-26 SURGERY — ARTHROPLASTY, SHOULDER, TOTAL, REVERSE
Anesthesia: General | Site: Shoulder | Laterality: Left

## 2021-01-26 MED ORDER — PHENOL 1.4 % MT LIQD
1.0000 | OROMUCOSAL | Status: DC | PRN
  Administered 2021-01-26: 1 via OROMUCOSAL
  Filled 2021-01-26: qty 177

## 2021-01-26 MED ORDER — LIDOCAINE HCL (PF) 2 % IJ SOLN
INTRAMUSCULAR | Status: DC | PRN
  Administered 2021-01-26: 40 mg via INTRADERMAL

## 2021-01-26 MED ORDER — METOCLOPRAMIDE HCL 5 MG PO TABS: 5.0000 mg | ORAL_TABLET | Freq: Three times a day (TID) | ORAL | Status: DC | PRN

## 2021-01-26 MED ORDER — MIDAZOLAM HCL 2 MG/2ML IJ SOLN
1.0000 mg | INTRAMUSCULAR | Status: DC
  Administered 2021-01-26: 1 mg via INTRAVENOUS
  Filled 2021-01-26: qty 2

## 2021-01-26 MED ORDER — STERILE WATER FOR IRRIGATION IR SOLN
Status: DC | PRN
  Administered 2021-01-26: 1000 mL

## 2021-01-26 MED ORDER — CHLORHEXIDINE GLUCONATE 0.12 % MT SOLN
15.0000 mL | Freq: Once | OROMUCOSAL | Status: AC
  Administered 2021-01-26: 15 mL via OROMUCOSAL

## 2021-01-26 MED ORDER — ONDANSETRON HCL 4 MG PO TABS: 4.0000 mg | ORAL_TABLET | Freq: Three times a day (TID) | ORAL | 1 refills | Status: AC | PRN

## 2021-01-26 MED ORDER — VANCOMYCIN HCL IN DEXTROSE 1-5 GM/200ML-% IV SOLN
1000.0000 mg | INTRAVENOUS | Status: AC
  Administered 2021-01-26: 1000 mg via INTRAVENOUS
  Filled 2021-01-26: qty 200

## 2021-01-26 MED ORDER — LIDOCAINE HCL (PF) 2 % IJ SOLN
INTRAMUSCULAR | Status: AC
  Filled 2021-01-26: qty 5

## 2021-01-26 MED ORDER — ADULT MULTIVITAMIN W/MINERALS CH
1.0000 | ORAL_TABLET | ORAL | Status: DC
  Administered 2021-01-26: 1 via ORAL
  Filled 2021-01-26: qty 1

## 2021-01-26 MED ORDER — SCOPOLAMINE 1 MG/3DAYS TD PT72
1.0000 | MEDICATED_PATCH | TRANSDERMAL | Status: DC
  Administered 2021-01-26: 1.5 mg via TRANSDERMAL

## 2021-01-26 MED ORDER — ORAL CARE MOUTH RINSE: 15.0000 mL | Freq: Once | OROMUCOSAL | Status: AC

## 2021-01-26 MED ORDER — DULOXETINE HCL 60 MG PO CPEP
120.0000 mg | ORAL_CAPSULE | Freq: Every day | ORAL | Status: DC
  Administered 2021-01-27: 120 mg via ORAL
  Filled 2021-01-26: qty 2

## 2021-01-26 MED ORDER — PHENYLEPHRINE HCL (PRESSORS) 10 MG/ML IV SOLN
INTRAVENOUS | Status: AC
  Filled 2021-01-26: qty 2

## 2021-01-26 MED ORDER — ONDANSETRON HCL 4 MG/2ML IJ SOLN
INTRAMUSCULAR | Status: DC | PRN
  Administered 2021-01-26: 4 mg via INTRAVENOUS

## 2021-01-26 MED ORDER — MENTHOL 3 MG MT LOZG
1.0000 | LOZENGE | OROMUCOSAL | Status: DC | PRN
  Filled 2021-01-26: qty 9

## 2021-01-26 MED ORDER — PROPOFOL 10 MG/ML IV BOLUS
INTRAVENOUS | Status: DC | PRN
  Administered 2021-01-26: 130 mg via INTRAVENOUS

## 2021-01-26 MED ORDER — ACETAMINOPHEN 10 MG/ML IV SOLN
INTRAVENOUS | Status: AC
  Filled 2021-01-26: qty 100

## 2021-01-26 MED ORDER — GLYCOPYRROLATE PF 0.2 MG/ML IJ SOSY
PREFILLED_SYRINGE | INTRAMUSCULAR | Status: DC | PRN
  Administered 2021-01-26: .2 mg via INTRAVENOUS

## 2021-01-26 MED ORDER — FAMOTIDINE 20 MG PO TABS
20.0000 mg | ORAL_TABLET | Freq: Every day | ORAL | Status: DC
  Administered 2021-01-26: 20 mg via ORAL
  Filled 2021-01-26: qty 1

## 2021-01-26 MED ORDER — HYDROMORPHONE HCL 1 MG/ML IJ SOLN
0.5000 mg | INTRAMUSCULAR | Status: DC | PRN
  Administered 2021-01-27: 1 mg via INTRAVENOUS
  Filled 2021-01-26: qty 1

## 2021-01-26 MED ORDER — IBUPROFEN 400 MG PO TABS: 400.0000 mg | ORAL_TABLET | Freq: Four times a day (QID) | ORAL | Status: DC | PRN

## 2021-01-26 MED ORDER — SCOPOLAMINE 1 MG/3DAYS TD PT72
MEDICATED_PATCH | TRANSDERMAL | Status: AC
  Filled 2021-01-26: qty 1

## 2021-01-26 MED ORDER — LACTATED RINGERS IV SOLN: INTRAVENOUS | Status: DC

## 2021-01-26 MED ORDER — ACETAMINOPHEN 325 MG PO TABS: 325.0000 mg | ORAL_TABLET | Freq: Four times a day (QID) | ORAL | Status: DC | PRN

## 2021-01-26 MED ORDER — TRAZODONE HCL 150 MG PO TABS
150.0000 mg | ORAL_TABLET | Freq: Every day | ORAL | Status: DC
  Administered 2021-01-26: 150 mg via ORAL
  Filled 2021-01-26: qty 1

## 2021-01-26 MED ORDER — ACETAMINOPHEN 10 MG/ML IV SOLN
1000.0000 mg | Freq: Once | INTRAVENOUS | Status: DC | PRN
  Administered 2021-01-26: 1000 mg via INTRAVENOUS

## 2021-01-26 MED ORDER — BUPIVACAINE-EPINEPHRINE (PF) 0.25% -1:200000 IJ SOLN
INTRAMUSCULAR | Status: DC | PRN
  Administered 2021-01-26: 10 mL

## 2021-01-26 MED ORDER — PHENYLEPHRINE HCL-NACL 20-0.9 MG/250ML-% IV SOLN
INTRAVENOUS | Status: DC | PRN
  Administered 2021-01-26: 50 ug/min via INTRAVENOUS

## 2021-01-26 MED ORDER — OXYCODONE-ACETAMINOPHEN 5-325 MG PO TABS: 1.0000 | ORAL_TABLET | ORAL | 0 refills | Status: AC | PRN

## 2021-01-26 MED ORDER — METHOCARBAMOL 500 MG PO TABS: 500.0000 mg | ORAL_TABLET | Freq: Four times a day (QID) | ORAL | 1 refills | Status: AC | PRN

## 2021-01-26 MED ORDER — LORATADINE 10 MG PO TABS: 10.0000 mg | ORAL_TABLET | Freq: Every day | ORAL | Status: DC

## 2021-01-26 MED ORDER — ONDANSETRON HCL 4 MG/2ML IJ SOLN: 4.0000 mg | Freq: Four times a day (QID) | INTRAMUSCULAR | Status: DC | PRN

## 2021-01-26 MED ORDER — HYDROMORPHONE HCL 1 MG/ML IJ SOLN: 0.2500 mg | INTRAMUSCULAR | Status: DC | PRN

## 2021-01-26 MED ORDER — PHENYLEPHRINE 40 MCG/ML (10ML) SYRINGE FOR IV PUSH (FOR BLOOD PRESSURE SUPPORT)
PREFILLED_SYRINGE | INTRAVENOUS | Status: DC | PRN
  Administered 2021-01-26: 80 ug via INTRAVENOUS

## 2021-01-26 MED ORDER — ONDANSETRON HCL 4 MG/2ML IJ SOLN
INTRAMUSCULAR | Status: AC
  Filled 2021-01-26: qty 2

## 2021-01-26 MED ORDER — OXYCODONE HCL 5 MG PO TABS
5.0000 mg | ORAL_TABLET | ORAL | Status: DC | PRN
  Administered 2021-01-27 (×2): 10 mg via ORAL
  Filled 2021-01-26 (×2): qty 2

## 2021-01-26 MED ORDER — SODIUM CHLORIDE 0.9 % IR SOLN
Status: DC | PRN
  Administered 2021-01-26: 1000 mL

## 2021-01-26 MED ORDER — PROMETHAZINE HCL 25 MG/ML IJ SOLN: 6.2500 mg | INTRAMUSCULAR | Status: DC | PRN

## 2021-01-26 MED ORDER — BUPIVACAINE HCL (PF) 0.5 % IJ SOLN
INTRAMUSCULAR | Status: DC | PRN
  Administered 2021-01-26: 15 mL via PERINEURAL

## 2021-01-26 MED ORDER — DIPHENHYDRAMINE HCL 50 MG/ML IJ SOLN
INTRAMUSCULAR | Status: AC
  Filled 2021-01-26: qty 1

## 2021-01-26 MED ORDER — METOCLOPRAMIDE HCL 5 MG/ML IJ SOLN: 5.0000 mg | Freq: Three times a day (TID) | INTRAMUSCULAR | Status: DC | PRN

## 2021-01-26 MED ORDER — MEPERIDINE HCL 50 MG/ML IJ SOLN: 6.2500 mg | INTRAMUSCULAR | Status: DC | PRN

## 2021-01-26 MED ORDER — POLYVINYL ALCOHOL 1.4 % OP SOLN
1.0000 [drp] | OPHTHALMIC | Status: DC | PRN
Start: 2021-01-26 — End: 2021-01-27
  Filled 2021-01-26: qty 15

## 2021-01-26 MED ORDER — EPHEDRINE SULFATE-NACL 50-0.9 MG/10ML-% IV SOSY
PREFILLED_SYRINGE | INTRAVENOUS | Status: DC | PRN
  Administered 2021-01-26: 5 mg via INTRAVENOUS
  Administered 2021-01-26 (×4): 10 mg via INTRAVENOUS

## 2021-01-26 MED ORDER — BUPIVACAINE-EPINEPHRINE (PF) 0.25% -1:200000 IJ SOLN
INTRAMUSCULAR | Status: AC
  Filled 2021-01-26: qty 30

## 2021-01-26 MED ORDER — SODIUM CHLORIDE 0.9 % IV SOLN: INTRAVENOUS | Status: DC

## 2021-01-26 MED ORDER — FLUTICASONE PROPIONATE 50 MCG/ACT NA SUSP
2.0000 | Freq: Every day | NASAL | Status: DC
  Filled 2021-01-26: qty 16

## 2021-01-26 MED ORDER — DIPHENHYDRAMINE HCL 50 MG/ML IJ SOLN
6.2500 mg | Freq: Once | INTRAMUSCULAR | Status: AC
  Administered 2021-01-26: 6.25 mg via INTRAVENOUS

## 2021-01-26 MED ORDER — FENTANYL CITRATE PF 50 MCG/ML IJ SOSY
50.0000 ug | PREFILLED_SYRINGE | INTRAMUSCULAR | Status: DC
  Administered 2021-01-26: 50 ug via INTRAVENOUS
  Filled 2021-01-26: qty 2

## 2021-01-26 MED ORDER — ACETAMINOPHEN 160 MG/5ML PO SOLN: 325.0000 mg | Freq: Once | ORAL | Status: DC | PRN

## 2021-01-26 MED ORDER — BUPIVACAINE LIPOSOME 1.3 % IJ SUSP
INTRAMUSCULAR | Status: DC | PRN
  Administered 2021-01-26: 10 mL via PERINEURAL

## 2021-01-26 MED ORDER — CYCLOSPORINE 0.05 % OP EMUL
1.0000 [drp] | Freq: Two times a day (BID) | OPHTHALMIC | Status: DC
  Administered 2021-01-26: 1 [drp] via OPHTHALMIC
  Filled 2021-01-26 (×2): qty 30

## 2021-01-26 MED ORDER — AMISULPRIDE (ANTIEMETIC) 5 MG/2ML IV SOLN: 10.0000 mg | Freq: Once | INTRAVENOUS | Status: DC | PRN

## 2021-01-26 MED ORDER — DEXAMETHASONE SODIUM PHOSPHATE 10 MG/ML IJ SOLN
INTRAMUSCULAR | Status: DC | PRN
  Administered 2021-01-26: 10 mg via INTRAVENOUS

## 2021-01-26 MED ORDER — PANTOPRAZOLE SODIUM 40 MG PO TBEC: 40.0000 mg | DELAYED_RELEASE_TABLET | Freq: Every day | ORAL | Status: DC

## 2021-01-26 MED ORDER — ONDANSETRON HCL 4 MG PO TABS: 4.0000 mg | ORAL_TABLET | Freq: Four times a day (QID) | ORAL | Status: DC | PRN

## 2021-01-26 MED ORDER — SUGAMMADEX SODIUM 200 MG/2ML IV SOLN
INTRAVENOUS | Status: DC | PRN
  Administered 2021-01-26: 200 mg via INTRAVENOUS

## 2021-01-26 MED ORDER — METHOCARBAMOL 500 MG IVPB - SIMPLE MED
500.0000 mg | Freq: Four times a day (QID) | INTRAVENOUS | Status: DC | PRN
  Filled 2021-01-26: qty 50

## 2021-01-26 MED ORDER — DOCUSATE SODIUM 100 MG PO CAPS
100.0000 mg | ORAL_CAPSULE | Freq: Two times a day (BID) | ORAL | Status: DC
  Administered 2021-01-26 – 2021-01-27 (×2): 100 mg via ORAL
  Filled 2021-01-26 (×2): qty 1

## 2021-01-26 MED ORDER — ACETAMINOPHEN 325 MG PO TABS: 325.0000 mg | ORAL_TABLET | Freq: Once | ORAL | Status: DC | PRN

## 2021-01-26 MED ORDER — OXYBUTYNIN CHLORIDE ER 5 MG PO TB24
10.0000 mg | ORAL_TABLET | Freq: Every day | ORAL | Status: DC
  Administered 2021-01-26: 10 mg via ORAL
  Filled 2021-01-26: qty 2

## 2021-01-26 MED ORDER — DEXAMETHASONE SODIUM PHOSPHATE 10 MG/ML IJ SOLN
INTRAMUSCULAR | Status: AC
  Filled 2021-01-26: qty 1

## 2021-01-26 MED ORDER — ALBUMIN HUMAN 5 % IV SOLN: INTRAVENOUS | Status: DC | PRN

## 2021-01-26 MED ORDER — ROCURONIUM BROMIDE 10 MG/ML (PF) SYRINGE
PREFILLED_SYRINGE | INTRAVENOUS | Status: DC | PRN
  Administered 2021-01-26: 50 mg via INTRAVENOUS

## 2021-01-26 MED ORDER — PROPOFOL 10 MG/ML IV BOLUS
INTRAVENOUS | Status: AC
  Filled 2021-01-26: qty 20

## 2021-01-26 MED ORDER — VANCOMYCIN HCL IN DEXTROSE 1-5 GM/200ML-% IV SOLN
1000.0000 mg | Freq: Two times a day (BID) | INTRAVENOUS | Status: AC
  Administered 2021-01-26: 1000 mg via INTRAVENOUS
  Filled 2021-01-26: qty 200

## 2021-01-26 MED ORDER — METHOCARBAMOL 500 MG PO TABS
500.0000 mg | ORAL_TABLET | Freq: Four times a day (QID) | ORAL | Status: DC | PRN
  Administered 2021-01-27 (×2): 500 mg via ORAL
  Filled 2021-01-26 (×2): qty 1

## 2021-01-26 MED ORDER — NYSTATIN-TRIAMCINOLONE 100000-0.1 UNIT/GM-% EX CREA
1.0000 "application " | TOPICAL_CREAM | Freq: Two times a day (BID) | CUTANEOUS | Status: DC | PRN
  Filled 2021-01-26: qty 30

## 2021-01-26 MED ORDER — ASCORBIC ACID 500 MG PO TABS
1000.0000 mg | ORAL_TABLET | ORAL | Status: DC
  Administered 2021-01-26: 1000 mg via ORAL
  Filled 2021-01-26: qty 2

## 2021-01-26 SURGICAL SUPPLY — 73 items
AID PSTN UNV HD RSTRNT DISP (MISCELLANEOUS) ×1
BAG COUNTER SPONGE SURGICOUNT (BAG) ×1 IMPLANT
BAG SPEC THK2 15X12 ZIP CLS (MISCELLANEOUS) ×1
BAG SPNG CNTER NS LX DISP (BAG) ×1
BAG ZIPLOCK 12X15 (MISCELLANEOUS) ×1 IMPLANT
BIT DRILL 1.6MX128 (BIT) IMPLANT
BIT DRILL 170X2.5X (BIT) IMPLANT
BIT DRL 170X2.5X (BIT) ×1
BLADE SAG 18X100X1.27 (BLADE) ×2 IMPLANT
CONTROL EPI SZ1 (Orthopedic Implant) IMPLANT
COVER BACK TABLE 60X90IN (DRAPES) ×2 IMPLANT
COVER SURGICAL LIGHT HANDLE (MISCELLANEOUS) ×2 IMPLANT
CTR EPI SZ1 (Orthopedic Implant) ×2 IMPLANT
DECANTER SPIKE VIAL GLASS SM (MISCELLANEOUS) ×2 IMPLANT
DRAPE INCISE IOBAN 66X45 STRL (DRAPES) ×2 IMPLANT
DRAPE ORTHO SPLIT 77X108 STRL (DRAPES) ×4
DRAPE SHEET LG 3/4 BI-LAMINATE (DRAPES) ×2 IMPLANT
DRAPE SURG ORHT 6 SPLT 77X108 (DRAPES) ×2 IMPLANT
DRAPE TOP 10253 STERILE (DRAPES) ×2 IMPLANT
DRAPE U-SHAPE 47X51 STRL (DRAPES) ×2 IMPLANT
DRILL 2.5 (BIT) ×2
DRSG ADAPTIC 3X8 NADH LF (GAUZE/BANDAGES/DRESSINGS) ×2 IMPLANT
DRSG PAD ABDOMINAL 8X10 ST (GAUZE/BANDAGES/DRESSINGS) ×2 IMPLANT
DURAPREP 26ML APPLICATOR (WOUND CARE) ×2 IMPLANT
ELECT BLADE TIP CTD 4 INCH (ELECTRODE) ×2 IMPLANT
ELECT NDL TIP 2.8 STRL (NEEDLE) ×1 IMPLANT
ELECT NEEDLE TIP 2.8 STRL (NEEDLE) ×2 IMPLANT
ELECT REM PT RETURN 15FT ADLT (MISCELLANEOUS) ×2 IMPLANT
FACESHIELD WRAPAROUND (MASK) ×2 IMPLANT
FACESHIELD WRAPAROUND OR TEAM (MASK) ×1 IMPLANT
GAUZE SPONGE 4X4 12PLY STRL (GAUZE/BANDAGES/DRESSINGS) ×2 IMPLANT
GLENOSPHERE DELTA XTEND LAT 38 (Miscellaneous) ×1 IMPLANT
GLOVE SURG ORTHO LTX SZ7.5 (GLOVE) ×2 IMPLANT
GLOVE SURG ORTHO LTX SZ8.5 (GLOVE) ×2 IMPLANT
GLOVE SURG UNDER POLY LF SZ7.5 (GLOVE) ×2 IMPLANT
GLOVE SURG UNDER POLY LF SZ8.5 (GLOVE) ×2 IMPLANT
GOWN STRL REUS W/TWL XL LVL3 (GOWN DISPOSABLE) ×4 IMPLANT
KIT BASIN OR (CUSTOM PROCEDURE TRAY) ×2 IMPLANT
KIT TURNOVER KIT A (KITS) ×1 IMPLANT
MANIFOLD NEPTUNE II (INSTRUMENTS) ×2 IMPLANT
METAGLENE DELTA EXTEND (Trauma) IMPLANT
METAGLENE DXTEND (Trauma) ×2 IMPLANT
NDL MAYO CATGUT SZ4 TPR NDL (NEEDLE) IMPLANT
NEEDLE MAYO CATGUT SZ4 (NEEDLE) IMPLANT
NS IRRIG 1000ML POUR BTL (IV SOLUTION) ×2 IMPLANT
PACK SHOULDER (CUSTOM PROCEDURE TRAY) ×2 IMPLANT
PIN GUIDE 1.2 (PIN) ×1 IMPLANT
PIN GUIDE GLENOPHERE 1.5MX300M (PIN) ×1 IMPLANT
PIN METAGLENE 2.5 (PIN) ×1 IMPLANT
PROTECTOR NERVE ULNAR (MISCELLANEOUS) ×2 IMPLANT
RESTRAINT HEAD UNIVERSAL NS (MISCELLANEOUS) ×2 IMPLANT
SCREW 4.5X18MM (Screw) ×2 IMPLANT
SCREW BN 18X4.5XSTRL SHLDR (Screw) IMPLANT
SCREW LOCK 42 (Screw) ×1 IMPLANT
SCREW LOCK DELTA XTEND 4.5X30 (Screw) ×1 IMPLANT
SLING ARM FOAM STRAP LRG (SOFTGOODS) IMPLANT
SMARTMIX MINI TOWER (MISCELLANEOUS)
SPACER 38 PLUS 3 (Spacer) ×1 IMPLANT
SPONGE T-LAP 18X18 ~~LOC~~+RFID (SPONGE) ×1 IMPLANT
SPONGE T-LAP 4X18 ~~LOC~~+RFID (SPONGE) ×1 IMPLANT
STEM 12 HA (Stem) ×1 IMPLANT
STRIP CLOSURE SKIN 1/2X4 (GAUZE/BANDAGES/DRESSINGS) ×2 IMPLANT
SUCTION FRAZIER HANDLE 10FR (MISCELLANEOUS) ×2
SUCTION TUBE FRAZIER 10FR DISP (MISCELLANEOUS) ×1 IMPLANT
SUT FIBERWIRE #2 38 T-5 BLUE (SUTURE) ×4
SUT MNCRL AB 4-0 PS2 18 (SUTURE) ×2 IMPLANT
SUT VIC AB 0 CT1 36 (SUTURE) ×4 IMPLANT
SUT VIC AB 0 CT2 27 (SUTURE) ×2 IMPLANT
SUT VIC AB 2-0 CT1 27 (SUTURE) ×2
SUT VIC AB 2-0 CT1 TAPERPNT 27 (SUTURE) ×1 IMPLANT
SUTURE FIBERWR #2 38 T-5 BLUE (SUTURE) ×2 IMPLANT
TOWEL OR 17X26 10 PK STRL BLUE (TOWEL DISPOSABLE) ×2 IMPLANT
TOWER SMARTMIX MINI (MISCELLANEOUS) IMPLANT

## 2021-01-26 NOTE — Progress Notes (Signed)
Assisted Dr. Shona Simpson with Left Interscalene brachial plexus block. Side rails up, monitors on throughout procedure. See vital signs in flow sheet. Tolerated Procedure well.

## 2021-01-26 NOTE — Brief Op Note (Signed)
01/26/2021  12:02 PM  PATIENT:  Alphonzo Lemmings  65 y.o. 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  PROCEDURE:  Procedure(s) with comments: REVERSE SHOULDER ARTHROPLASTY (Left) - with ISB DePuy Delta Xtend with NO subscap repair  SURGEON:  Surgeon(s) and Role:    Beverely Low, MD - Primary  PHYSICIAN ASSISTANT:   ASSISTANTS: Thea Gist, PA-C   ANESTHESIA:   regional and general  EBL:  200 mL   BLOOD ADMINISTERED:none  DRAINS: none   LOCAL MEDICATIONS USED:  MARCAINE     SPECIMEN:  No Specimen  DISPOSITION OF SPECIMEN:  N/A  COUNTS:  YES  TOURNIQUET:  * No tourniquets in log *  DICTATION: .Other Dictation: Dictation Number [id number]  PLAN OF CARE: Admit for overnight observation  PATIENT DISPOSITION:  PACU - hemodynamically stable.   Delay start of Pharmacological VTE agent (>24hrs) due to surgical blood loss or risk of bleeding: not applicable

## 2021-01-26 NOTE — Discharge Instructions (Signed)
Ice to the shoulder constantly.  Keep the incision covered and clean and dry for one week, then ok to get it wet in the shower. ° °Do exercise as instructed several times per day. ° °DO NOT reach behind your back or push up out of a chair with the operative arm. ° °Use a sling while you are up and around for comfort, may remove while seated.  Keep pillow propped behind the operative elbow. ° °Follow up with Dr Tegan Britain in two weeks in the office, call [phone] for appt °

## 2021-01-26 NOTE — Anesthesia Preprocedure Evaluation (Signed)
Anesthesia Evaluation  Patient identified by MRN, date of birth, ID band Patient awake    Reviewed: Allergy & Precautions, NPO status , Patient's Chart, lab work & pertinent test results  History of Anesthesia Complications (+) PONV and history of anesthetic complications  Airway Mallampati: II  TM Distance: >3 FB Neck ROM: Full    Dental  (+) Teeth Intact, Dental Advisory Given   Pulmonary neg pulmonary ROS,    breath sounds clear to auscultation       Cardiovascular  Rhythm:Regular Rate:Normal     Neuro/Psych PSYCHIATRIC DISORDERS Depression negative neurological ROS     GI/Hepatic Neg liver ROS, hiatal hernia, GERD  Medicated,  Endo/Other  negative endocrine ROS  Renal/GU negative Renal ROS     Musculoskeletal  (+) Arthritis ,   Abdominal Normal abdominal exam  (+)   Peds  Hematology negative hematology ROS (+)   Anesthesia Other Findings   Reproductive/Obstetrics                             Anesthesia Physical Anesthesia Plan  ASA: 2  Anesthesia Plan: General   Post-op Pain Management:    Induction: Intravenous  PONV Risk Score and Plan: 4 or greater and Ondansetron, Dexamethasone, Midazolam and Scopolamine patch - Pre-op  Airway Management Planned: Oral ETT  Additional Equipment: None  Intra-op Plan:   Post-operative Plan: Extubation in OR  Informed Consent: I have reviewed the patients History and Physical, chart, labs and discussed the procedure including the risks, benefits and alternatives for the proposed anesthesia with the patient or authorized representative who has indicated his/her understanding and acceptance.     Dental advisory given  Plan Discussed with: CRNA  Anesthesia Plan Comments:         Anesthesia Quick Evaluation

## 2021-01-26 NOTE — Plan of Care (Signed)
°  Problem: Education: °Goal: Knowledge of General Education information will improve °Description: Including pain rating scale, medication(s)/side effects and non-pharmacologic comfort measures °Outcome: Progressing °  °Problem: Activity: °Goal: Risk for activity intolerance will decrease °Outcome: Progressing °  °Problem: Nutrition: °Goal: Adequate nutrition will be maintained °Outcome: Progressing °  °Problem: Elimination: °Goal: Will not experience complications related to bowel motility °Outcome: Progressing °  °Problem: Pain Managment: °Goal: General experience of comfort will improve °Outcome: Progressing °  °Problem: Safety: °Goal: Ability to remain free from injury will improve °Outcome: Progressing °  °Problem: Education: °Goal: Knowledge of the prescribed therapeutic regimen will improve °Outcome: Progressing °  °Problem: Activity: °Goal: Ability to tolerate increased activity will improve °Outcome: Progressing °  °Problem: Pain Management: °Goal: Pain level will decrease with appropriate interventions °Outcome: Progressing °  °

## 2021-01-26 NOTE — Anesthesia Procedure Notes (Signed)
Anesthesia Regional Block: Interscalene brachial plexus block   Pre-Anesthetic Checklist: , timeout performed,  Correct Patient, Correct Site, Correct Laterality,  Correct Procedure, Correct Position, site marked,  Risks and benefits discussed,  Surgical consent,  Pre-op evaluation,  At surgeon's request and post-op pain management  Laterality: Left  Prep: chloraprep       Needles:  Injection technique: Single-shot  Needle Type: Echogenic Stimulator Needle     Needle Length: 9cm  Needle Gauge: 21     Additional Needles:   Procedures:,,,, ultrasound used (permanent image in chart),,    Narrative:  Start time: 01/26/2021 8:20 AM End time: 01/26/2021 8:25 AM Injection made incrementally with aspirations every 5 mL.  Performed by: Personally  Anesthesiologist: Shelton Silvas, MD  Additional Notes: Patient tolerated the procedure well. Local anesthetic introduced in an incremental fashion under minimal resistance after negative aspirations. No paresthesias were elicited. After completion of the procedure, no acute issues were identified and patient continued to be monitored by RN.

## 2021-01-26 NOTE — Transfer of Care (Signed)
Immediate Anesthesia Transfer of Care Note  Patient: Wendy Reeves  Procedure(s) Performed: REVERSE SHOULDER ARTHROPLASTY (Left: Shoulder)  Patient Location: PACU  Anesthesia Type:General and Regional  Level of Consciousness: awake, alert  and oriented  Airway & Oxygen Therapy: Patient Spontanous Breathing and Patient connected to face mask  Post-op Assessment: Report given to RN and Post -op Vital signs reviewed and stable  Post vital signs: Reviewed and stable  Last Vitals:  Vitals Value Taken Time  BP 132/80 01/26/21 1216  Temp 36.4 C 01/26/21 1216  Pulse 81 01/26/21 1219  Resp 17 01/26/21 1219  SpO2 99 % 01/26/21 1219  Vitals shown include unvalidated device data.  Last Pain:  Vitals:   01/26/21 0704  TempSrc: Oral  PainSc: 0-No pain         Complications: No notable events documented.

## 2021-01-26 NOTE — Interval H&P Note (Signed)
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  The various methods of treatment have been discussed with the patient and family. After consideration of risks, benefits and other options for treatment, the patient has consented to  Procedure(s) with comments: REVERSE SHOULDER ARTHROPLASTY (Left) - with ISB as a surgical intervention.  The patient's history has been reviewed, patient examined, no change in status, stable for surgery.  I have reviewed the patient's chart and labs.  Questions were answered to the patient's satisfaction.     Verlee Rossetti

## 2021-01-26 NOTE — Op Note (Signed)
NAMENOVA, SCHMUHL MEDICAL RECORD NO: [id number] ACCOUNT NO: [id number] DATE OF BIRTH: 1955/09/13 FACILITY: [facility] LOCATION: [id number] PHYSICIAN: Almedia Balls. 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  PROCEDURE PERFORMED:  Left reverse shoulder replacement using DePuy Delta Xtend prosthesis, no subscap repair was performed.  ATTENDING SURGEON:  Almedia Balls. Ranell Patrick, MD  ASSISTANT:  Konrad Felix Dixon, [place], who was scrubbed during the entire procedure, and necessary for satisfactory completion of the surgery.  General anesthesia was used plus interscalene block.  ESTIMATED BLOOD LOSS:  200 mL  FLUID REPLACEMENT:  1500 mL crystalloid.  Instrument counts were correct.  No complications.  Perioperative antibiotics were given.  INDICATIONS:  The patient is a 65 year old female with worsening left shoulder pain secondary to bone-on-bone end-stage arthritis.  The patient also has rotator cuff insufficiency with poor function.  Given the combination of poor rotator cuff function  and end-stage arthritis, we discussed options, recommending reverse shoulder replacement.  The patient elected to proceed with surgery.  Risks including but not limited to instability and infection were discussed with the patient.  Informed consent  obtained.  DESCRIPTION OF PROCEDURE:  After an adequate level of anesthesia was achieved, the patient was positioned in the modified beach chair position.  Left shoulder correctly identified and sterilely prepped and draped in the usual manner.  Timeout called,  verifying correct patient, correct site.  We entered the patient's shoulder.  Using a standard deltopectoral incision starting at the coracoid process extending down to the anterior humerus.  Dissection down  through subcutaneous tissues using Bovie.  We  identified cephalic vein, took that laterally the deltoid, pectoralis was taken medially.  Conjoined tendon identified and retracted medially.  We tenodesed the biceps in situ with 0 Vicryl figure-of-eight suture x2.  We then released the subscapularis  subperiosteally off the lesser tuberosity.  This was in poor condition and not repairable, but we did tag it to protect the axillary nerve.  We released the inferior capsule progressively externally rotating and delivering the humeral head out of the  wound.  We entered the proximal humerus, which was devoid of any cartilage with a 6 mm reamer and reamed up to a size 12.  We then used the T-handle 12 mm guide to resect the head at 20 degrees of retroversion with the oscillating saw, we removed the  excess osteophytes and then subluxed the humerus posteriorly.  We were able to get good visualization of the glenoid.  We removed the biceps stump, the labrum and the capsule, careful to protect the axillary nerve.  We found the center point for the  inferior glenoid and placed our guide pin for the metaglene preparation.  We then reamed for the metaglene baseplate down to subchondral bone.  We then drilled our central peg hole.  We did our peripheral hand reaming and removed the pin.  We then placed  our metaglene baseplate into the appropriate position, referencing off the 12 and 6 o'clock position.  We placed our 42 screw inferiorly, 30 at the base of the coracoid and 18 at the anterior screw hole. We had excellent baseplate security.  We then  placed a 38 standard glenosphere on the baseplate and secured it, did a finger sweep to make sure we had no soft tissue caught up in the bearing.  We then  finished our humeral preparation reaming for the one centered metaphysis and we selected the 12  stem, 1 centered metaphysis set on the 0 setting and placed in 20 degrees of retroversion.  The stem was secured.  We then  placed our 38+3 poly trial onto the tray of the humerus and then reduced the shoulder.  We were pleased with our soft tissue  balancing.  We had good tension on the conjoined and no gapping at all with external rotation or inferior pole, we removed the trial components.  We irrigated thoroughly.  We then used available bone graft from the humeral head and impaction grafting  technique with the HA press-fit 12 stem and the one centered metaphysis again set in the 0 setting impacted in 20 degrees of retroversion with a stable stem.  We placed a 38+3 poly final into the tray and then impacted it.  We reduced the shoulder.  We  were pleased again with our soft tissue balancing and stability and range of motion.  We irrigated thoroughly, resected the subscap remnant and then repaired the deltopectoral interval with 0 Vicryl suture followed by 2-0 Vicryl for subcutaneous closure  and 4-0 Monocryl for skin.  Steri-Strips were applied followed by sterile dressing.  The patient tolerated the surgery well.     SUJ D: 01/26/2021 12:08:11 pm T: 01/26/2021 11:38:00 pm  JOB: [id number]/ [id number]

## 2021-01-26 NOTE — Anesthesia Postprocedure Evaluation (Signed)
Anesthesia Post Note  Patient: Wendy Reeves  Procedure(s) Performed: REVERSE SHOULDER ARTHROPLASTY (Left: Shoulder)     Patient location during evaluation: PACU Anesthesia Type: General Level of consciousness: awake and alert Pain management: pain level controlled Vital Signs Assessment: post-procedure vital signs reviewed and stable Respiratory status: spontaneous breathing, nonlabored ventilation, respiratory function stable and patient connected to nasal cannula oxygen Cardiovascular status: blood pressure returned to baseline and stable Postop Assessment: no apparent nausea or vomiting Anesthetic complications: no   No notable events documented.  Last Vitals:  Vitals:   01/26/21 1345 01/26/21 1547  BP: 127/65 129/67  Pulse: 73 84  Resp: 18 18  Temp: 36.5 C 36.6 C  SpO2: 95% 97%    Last Pain:  Vitals:   01/26/21 1547  TempSrc: Oral  PainSc:                  Shelton Silvas

## 2021-01-26 NOTE — Anesthesia Procedure Notes (Signed)
Procedure Name: Intubation Date/Time: 01/26/2021 10:37 AM Performed by: Sudie Grumbling, CRNA Pre-anesthesia Checklist: Patient identified, Emergency Drugs available, Suction available and Patient being monitored Patient Re-evaluated:Patient Re-evaluated prior to induction Oxygen Delivery Method: Circle system utilized Preoxygenation: Pre-oxygenation with 100% oxygen Induction Type: IV induction Ventilation: Mask ventilation without difficulty Laryngoscope Size: Glidescope and 3 Grade View: Grade I Tube type: Oral Tube size: 7.0 mm Number of attempts: 1 Airway Equipment and Method: Stylet Placement Confirmation: ETT inserted through vocal cords under direct vision, positive ETCO2 and breath sounds checked- equal and bilateral Secured at: 22 cm Tube secured with: Tape Dental Injury: Teeth and Oropharynx as per pre-operative assessment

## 2021-01-27 DIAGNOSIS — M19012 Primary osteoarthritis, left shoulder: Secondary | ICD-10-CM | POA: Diagnosis not present

## 2021-01-27 LAB — BASIC METABOLIC PANEL
Anion gap: 4 — ABNORMAL LOW (ref 5–15)
BUN: 9 mg/dL (ref 8–23)
CO2: 28 mmol/L (ref 22–32)
Calcium: 8.3 mg/dL — ABNORMAL LOW (ref 8.9–10.3)
Chloride: 107 mmol/L (ref 98–111)
Creatinine, Ser: 0.53 mg/dL (ref 0.44–1.00)
GFR, Estimated: >60 mL/min (ref >60)
Glucose, Bld: 113 mg/dL — ABNORMAL HIGH (ref 70–99)
Potassium: 3.6 mmol/L (ref 3.5–5.1)
Sodium: 139 mmol/L (ref 135–145)

## 2021-01-27 LAB — HEMOGLOBIN AND HEMATOCRIT, BLOOD
HCT: 34.2 % — ABNORMAL LOW (ref 36.0–46.0)
Hemoglobin: 10.8 g/dL — ABNORMAL LOW (ref 12.0–15.0)

## 2021-01-27 NOTE — Discharge Summary (Signed)
In most cases prophylactic antibiotics for Dental procdeures after total joint surgery are not necessary.  Exceptions are as follows:  1. History of prior total joint infection  2. Severely immunocompromised (Organ Transplant, cancer chemotherapy, Rheumatoid biologic meds such as Humera)  3. Poorly controlled diabetes (A1C &gt; 8.0, blood glucose over 200)  If you have one of these conditions, contact your surgeon for an antibiotic prescription, prior to your dental procedure. Orthopedic Discharge Summary        Physician Discharge Summary  Patient ID: Wendy Reeves MRN: [id number] DOB/AGE: Jul 30, 1955 65 y.o.  Admit date: 01/26/2021 Discharge date: 01/27/2021   Procedures:  Procedure(s) (LRB): REVERSE SHOULDER ARTHROPLASTY (Left)  Attending Physician:  Dr. Malon Kindle  Admission Diagnoses:   Left shoulder end stage OA  Discharge Diagnoses:  same   Past Medical History:  Diagnosis Date   Acute frontal sinusitis 03/23/2020   Arthritis    Chronic constipation    Depression    GERD (gastroesophageal reflux disease)    H/O hiatal hernia    had surgery   History of blood transfusion    PONV (postoperative nausea and vomiting)    Urinary incontinence 07/21/2012    PCP: Valentino Nose, NP   Discharged Condition: good  Hospital Course:  Patient underwent the above stated procedure on 01/26/2021. Patient tolerated the procedure well and brought to the recovery room in good condition and subsequently to the floor. Patient had an uncomplicated hospital course and was stable for discharge.   Disposition: Discharge disposition: 01-Home or Self Care      with follow up in 2 weeks    Follow-up Information     Beverely Low, MD. Call in 2 week(s).   Specialty: Orthopedic Surgery Why: call [phone] for appt Contact information: [address] [place] [address] [phone]                 Dental Antibiotics:  In  most cases prophylactic antibiotics for Dental procdeures after total joint surgery are not necessary.  Exceptions are as follows:  1. History of prior total joint infection  2. Severely immunocompromised (Organ Transplant, cancer chemotherapy, Rheumatoid biologic meds such as Humera)  3. Poorly controlled diabetes (A1C &gt; 8.0, blood glucose over 200)  If you have one of these conditions, contact your surgeon for an antibiotic prescription, prior to your dental procedure.  Discharge Instructions     Call MD / Call 911   Complete by: As directed    If you experience chest pain or shortness of breath, CALL 911 and be transported to the hospital emergency room.  If you develope a fever above 101 F, pus (white drainage) or increased drainage or redness at the wound, or calf pain, call your surgeon's office.   Constipation Prevention   Complete by: As directed    Drink plenty of fluids.  Prune juice may be helpful.  You may use a stool softener, such as Colace (over the counter) 100 mg twice a day.  Use MiraLax (over the counter) for constipation as needed.   Diet - low sodium heart healthy   Complete by: As directed    Increase activity slowly as tolerated   Complete by: As directed    Post-operative opioid taper instructions:   Complete by: As directed    POST-OPERATIVE OPIOID TAPER INSTRUCTIONS: It is important to wean off of your opioid medication as soon as possible. If you do not need pain medication after your  surgery it is ok to stop day one. Opioids include: Codeine, Hydrocodone(Norco, Vicodin), Oxycodone(Percocet, oxycontin) and hydromorphone amongst others.  Long term and even short term use of opiods can cause: Increased pain response Dependence Constipation Depression Respiratory depression And more.  Withdrawal symptoms can include Flu like symptoms Nausea, vomiting And more Techniques to manage these symptoms Hydrate well Eat regular healthy meals Stay  active Use relaxation techniques(deep breathing, meditating, yoga) Do Not substitute Alcohol to help with tapering If you have been on opioids for less than two weeks and do not have pain than it is ok to stop all together.  Plan to wean off of opioids This plan should start within one week post op of your joint replacement. Maintain the same interval or time between taking each dose and first decrease the dose.  Cut the total daily intake of opioids by one tablet each day Next start to increase the time between doses. The last dose that should be eliminated is the evening dose.          Allergies as of 01/27/2021       Reactions   Elemental Sulfur Hives   Penicillins Hives   Did it involve swelling of the face/tongue/throat, SOB, or low BP? Unknown Did it involve sudden or severe rash/hives, skin peeling, or any reaction on the inside of your mouth or nose? Unknown Did you need to seek medical attention at a hospital or doctor's office? Yes When did it last happen?More than 50 years ago If all above answers are "NO", may proceed with cephalosporin use.   Sulfa Antibiotics Hives        Medication List     TAKE these medications    Citracal Maximum Plus Tabs Take 2 tablets by mouth every other day.   cycloSPORINE 0.05 % ophthalmic emulsion Commonly known as: RESTASIS Place 1 drop into both eyes 2 (two) times daily.   DULoxetine 60 MG capsule Commonly known as: CYMBALTA Take 2 capsules (120 mg total) by mouth daily.   famotidine 20 MG tablet Commonly known as: PEPCID Take 20 mg by mouth at bedtime.   fluticasone 50 MCG/ACT nasal spray Commonly known as: FLONASE Place 2 sprays into both nostrils daily. What changed:  when to take this reasons to take this   ibuprofen 800 MG tablet Commonly known as: ADVIL TAKE 1 TABLET BY MOUTH EVERY 8 HOURS AS NEEDED FOR MODERATE PAIN.   loratadine 10 MG tablet Commonly known as: CLARITIN Take 1 tablet (10 mg total) by  mouth daily. What changed:  when to take this reasons to take this   LUBRICATING EYE DROPS OP Place 1 drop into both eyes daily as needed (dry eyes).   methocarbamol 500 MG tablet Commonly known as: Robaxin Take 1 tablet (500 mg total) by mouth every 6 (six) hours as needed for muscle spasms.   nystatin-triamcinolone cream Commonly known as: MYCOLOG II APPLY TO AFFECTED AREA TWICE A DAY What changed: See the new instructions.   ondansetron 4 MG tablet Commonly known as: Zofran Take 1 tablet (4 mg total) by mouth every 8 (eight) hours as needed for nausea, vomiting or refractory nausea / vomiting.   oxybutynin 10 MG 24 hr tablet Commonly known as: DITROPAN-XL Take 10 mg by mouth at bedtime.   oxyCODONE-acetaminophen 5-325 MG tablet Commonly known as: Percocet Take 1 tablet by mouth every 4 (four) hours as needed for severe pain.   pantoprazole 40 MG tablet Commonly known as: PROTONIX Take 1 tablet (  40 mg total) by mouth daily. 30 minutes before breakfast   traZODone 150 MG tablet Commonly known as: DESYREL Take 1-2 tablets (150-300 mg total) by mouth at bedtime.   vitamin C with rose hips 1000 MG tablet Take 1,000 mg by mouth every other day.          Signed: Verlee Rossetti 01/27/2021, 8:52 AM  [facility] is now [facility] [address], [place], [place] [address] Phone: [phone] Facebook  Instagram  [employer]

## 2021-01-27 NOTE — Progress Notes (Signed)
Orthopedics Progress Note  Subjective: Pain is moderate and controlled with meds  Objective:  Vitals:   01/27/21 0215 01/27/21 0612  BP: 114/62 (!) 97/54  Pulse: 78 73  Resp: 16 17  Temp: 97.8 F (36.6 C) (!) 97.4 F (36.3 C)  SpO2: 95% 90%    General: Awake and alert  Musculoskeletal: left shoulder dressing changed, it was blood soaked so I replaced with gauze and tape Neurovascularly intact  Lab Results  Component Value Date   WBC 8.1 01/19/2021   HGB 10.8 (L) 01/27/2021   HCT 34.2 (L) 01/27/2021   MCV 91.1 01/19/2021   PLT 288 01/19/2021       Component Value Date/Time   NA 139 01/27/2021 0303   K 3.6 01/27/2021 0303   CL 107 01/27/2021 0303   CO2 28 01/27/2021 0303   GLUCOSE 113 (H) 01/27/2021 0303   BUN 9 01/27/2021 0303   CREATININE 0.53 01/27/2021 0303   CREATININE 0.70 11/08/2020 1537   CALCIUM 8.3 (L) 01/27/2021 0303   GFRNONAA >60 01/27/2021 0303   GFRNONAA 94 07/18/2020 1203   GFRAA 109 07/18/2020 1203    Lab Results  Component Value Date   INR 1.0 11/08/2020   INR 1.00 08/08/2014   INR 0.97 07/05/2014    Assessment/Plan: POD #1 s/p Procedure(s): REVERSE SHOULDER ARTHROPLASTY Stable overnight. PLan discharge to home after OT sees her. She will do a dry gauze change tomorrow and then the Aquacel after that which can stay on for 3-4 days. No need to cover the wound after one week. Follow up with me in two weeks  Almedia Balls. Ranell Patrick, MD 01/27/2021 8:49 AM

## 2021-01-27 NOTE — Evaluation (Signed)
Occupational Therapy Evaluation Patient Details Name: Wendy Reeves MRN: [id number] DOB: Jan 20, 1956 Today's Date: 01/27/2021   History of Present Illness Patient is a 65 year old female who underwent a left total shoulder replacement. PMH: depression, R TKA, L TKA   Clinical Impression   s/p shoulder replacement without functional use of left non dominant upper extremity secondary to effects of surgery and interscalene block and shoulder precautions. Therapist provided education and instruction to patient and spouse in regards to exercises, precautions, positioning, donning upper extremity clothing and bathing while maintaining shoulder precautions, ice and edema management and donning/doffing sling. Patient and spouse verbalized understanding and demonstrated as needed. Patient needed assistance to donn shirt, underwear, pants, socks and shoes and provided with instruction on compensatory strategies to perform ADLs. Patient to follow up with MD for further therapy needs.        Recommendations for follow up therapy are one component of a multi-disciplinary discharge planning process, led by the attending physician.  Recommendations may be updated based on patient status, additional functional criteria and insurance authorization.   Follow Up Recommendations  Follow physician's recommendations for discharge plan and follow up therapies    Assistance Recommended at Discharge    Functional Status Assessment  Patient has had a recent decline in their functional status and demonstrates the ability to make significant improvements in function in a reasonable and predictable amount of time.  Equipment Recommendations  None recommended by OT    Recommendations for Other Services       Precautions / Restrictions Precautions Precautions: Shoulder Type of Shoulder Precautions: NO ROM shoulder, elbow hand and wrist ROM ok Shoulder Interventions: Shoulder sling/immobilizer;At all times;Off for  dressing/bathing/exercises Precaution Booklet Issued: Yes (comment) (handout provided) Restrictions Weight Bearing Restrictions: Yes LUE Weight Bearing: Non weight bearing      Mobility Bed Mobility Overal bed mobility: Modified Independent                  Transfers Overall transfer level: Modified independent Equipment used: None               General transfer comment: patient did report minimal dizziness but was noted to need cues from husband to slow down.      Balance Overall balance assessment: Mild deficits observed, not formally tested                                         ADL either performed or assessed with clinical judgement   ADL Overall ADL's : At baseline                                       General ADL Comments: patient was able to don pants, underwear and socks with MI with increased time and education to slow down. patient was max A to don button up shirt and sling with educationprovided with husband as well during session.patient was able to complete toileting tasks with MI with sling in place.     Vision Patient Visual Report: No change from baseline       Perception     Praxis      Pertinent Vitals/Pain Pain Assessment: Faces Faces Pain Scale: Hurts a little bit Pain Location: under LUE Pain Descriptors / Indicators: Discomfort Pain Intervention(s): Limited activity within patient's tolerance;Monitored  during session     Hand Dominance Right   Extremity/Trunk Assessment Upper Extremity Assessment Upper Extremity Assessment: LUE deficits/detail LUE: Unable to fully assess due to immobilization;Unable to fully assess due to pain           Communication Communication Communication: No difficulties   Cognition Arousal/Alertness: Awake/alert Behavior During Therapy: WFL for tasks assessed/performed Overall Cognitive Status: Within Functional Limits for tasks assessed                                  General Comments: husband present during session as well.     General Comments       Exercises     Shoulder Instructions Shoulder Instructions Donning/doffing shirt without moving shoulder: Caregiver independent with task;Patient able to independently direct caregiver Method for sponge bathing under operated UE: Caregiver independent with task;Patient able to independently direct caregiver Donning/doffing sling/immobilizer: Caregiver independent with task;Patient able to independently direct caregiver Correct positioning of sling/immobilizer: Caregiver independent with task;Patient able to independently direct caregiver ROM for elbow, wrist and digits of operated UE:  (block in place but verbalized understanding) Sling wearing schedule (on at all times/off for ADL's): Caregiver independent with task;Patient able to independently direct caregiver Proper positioning of operated UE when showering: Patient able to independently direct caregiver;Caregiver independent with task Dressing change: Patient able to independently direct caregiver Positioning of UE while sleeping: Caregiver independent with task;Patient able to independently direct caregiver    Home Living Family/patient expects to be discharged to:: Private residence Living Arrangements: Spouse/significant other Available Help at Discharge: Family;Available PRN/intermittently                                    Prior Functioning/Environment Prior Level of Function : Independent/Modified Independent                        OT Problem List: Decreased knowledge of precautions;Decreased knowledge of use of DME or AE;Impaired UE functional use;Decreased safety awareness      OT Treatment/Interventions:      OT Goals(Current goals can be found in the care plan section) Acute Rehab OT Goals OT Goal Formulation: All assessment and education complete, DC therapy  OT Frequency:      Barriers to D/C:            Co-evaluation              AM-PAC OT "6 Clicks" Daily Activity     Outcome Measure Help from another person eating meals?: A Little Help from another person taking care of personal grooming?: A Little Help from another person toileting, which includes using toliet, bedpan, or urinal?: A Little Help from another person bathing (including washing, rinsing, drying)?: A Little Help from another person to put on and taking off regular upper body clothing?: A Lot Help from another person to put on and taking off regular lower body clothing?: A Little 6 Click Score: 17   End of Session Nurse Communication: Other (comment) (patients IV line leak and cleared patient to participate in session)  Activity Tolerance: Patient tolerated treatment well Patient left: with call bell/phone within reach;in bed;with family/visitor present  OT Visit Diagnosis: Pain Pain - Right/Left: Left Pain - part of body: Shoulder  Time: 1100-3496 OT Time Calculation (min): 23 min Charges:  OT General Charges $OT Visit: 1 Visit OT Evaluation $OT Eval Low Complexity: 1 Low OT Treatments $Self Care/Home Management : 8-22 mins  Sharyn Blitz OTR/L, MS Acute Rehabilitation Department Office# [phone] Pager# [phone]   [facility] Guest Floretta Petro 01/27/2021, 9:25 AM

## 2021-01-29 ENCOUNTER — Encounter (HOSPITAL_COMMUNITY): Payer: Self-pay | Admitting: Orthopedic Surgery

## 2021-01-29 ENCOUNTER — Telehealth: Payer: Self-pay

## 2021-01-29 NOTE — Telephone Encounter (Signed)
Transition Care Management Follow-up Telephone Call Date of discharge and from where: 01/27/2021 / [facility]  How have you been since you were released from the hospital? "Sleeping and resting a lot."  Any questions or concerns? Yes " I need a stool softener, I have not had a bowel movement since last Thursday."  RNCM advised patient to call surgeon's office to request advisement on stool softener. "   Items Reviewed: Did the pt receive and understand the discharge instructions provided? Yes  Medications obtained and verified? Yes  Other? No  Any new allergies since your discharge? No  Dietary orders reviewed? Yes Do you have support at home? Yes   Home Care and Equipment/Supplies: Were home health services ordered? not applicable If so, what is the name of the agency? N/a  Has the agency set up a time to come to the patient's home? not applicable Were any new equipment or medical supplies ordered?  No What is the name of the medical supply agency? N/a Were you able to get the supplies/equipment? not applicable Do you have any questions related to the use of the equipment or supplies? No  Functional Questionnaire: (I = Independent and D = Dependent) ADLs: D  Bathing/Dressing- D  Meal Prep- D  Eating- D  Maintaining continence- D  Transferring/Ambulation- D  Managing Meds- D  Follow up appointments reviewed:  PCP Hospital f/u appt confirmed?   Specialist Hospital f/u appt confirmed? Yes  Scheduled to see Dr. Malon Kindle on 02/08/2021 @ 9:00am . Are transportation arrangements needed? Yes  If their condition worsens, is the pt aware to call PCP or go to the Emergency Dept.? Yes Was the patient provided with contact information for the PCP's office or ED? Yes Was to pt encouraged to call back with questions or concerns? Yes  George Ina RN,BSN,CCM RN Case Manager [facility] [phone]

## 2021-02-07 ENCOUNTER — Ambulatory Visit: Payer: Medicare Other

## 2021-02-16 ENCOUNTER — Other Ambulatory Visit: Payer: Self-pay | Admitting: Adult Health

## 2021-03-05 ENCOUNTER — Ambulatory Visit: Payer: Medicare Other

## 2021-03-27 ENCOUNTER — Other Ambulatory Visit: Payer: Self-pay | Admitting: Nurse Practitioner

## 2021-03-27 DIAGNOSIS — J0110 Acute frontal sinusitis, unspecified: Secondary | ICD-10-CM

## 2021-03-29 ENCOUNTER — Telehealth: Payer: Self-pay | Admitting: Nurse Practitioner

## 2021-03-29 NOTE — Telephone Encounter (Signed)
Left message for patient to call back and schedule Medicare Annual Wellness Visit (AWV) in office.  ° °If not able to come in office, please offer to do virtually or by telephone.  Left office number and my jabber #[phone]. ° °Due for AWVI ° °Please schedule at anytime with Nurse Health Advisor. °  °

## 2021-03-30 ENCOUNTER — Other Ambulatory Visit: Payer: Self-pay

## 2021-03-30 ENCOUNTER — Ambulatory Visit (INDEPENDENT_AMBULATORY_CARE_PROVIDER_SITE_OTHER): Payer: Medicare Other

## 2021-03-30 VITALS — BP 120/74 | HR 82 | Ht 65.0 in | Wt 186.0 lb

## 2021-03-30 DIAGNOSIS — Z780 Asymptomatic menopausal state: Secondary | ICD-10-CM | POA: Diagnosis not present

## 2021-03-30 DIAGNOSIS — Z0000 Encounter for general adult medical examination without abnormal findings: Secondary | ICD-10-CM | POA: Diagnosis not present

## 2021-03-30 NOTE — Progress Notes (Signed)
Subjective:   Wendy Reeves is a 65 y.o. female who presents for Medicare Annual (Subsequent) preventive examination.  Review of Systems     Cardiac Risk Factors include: advanced age (>35mn, >>31women);sedentary lifestyle;obesity (BMI >30kg/m2)    IN OFFICE VISIT AT [facility]. Objective:    Today's Vitals   03/30/21 1119  BP: 120/74  Pulse: 82  SpO2: 100%  Weight: 186 lb (84.4 kg)  Height: 5' 5"  (1.651 m)   Body mass index is 30.95 kg/m.  Advanced Directives 03/30/2021 01/26/2021 01/19/2021 03/30/2019 03/24/2019 01/06/2018 09/11/2015  Does Patient Have a Medical Advance Directive? No No No No No No Yes  Type of Advance Directive - - - - - - Living will  Does patient want to make changes to medical advance directive? - - - - - - -  Copy of [place] Chart? - - - - - - -  Would patient like information on creating a medical advance directive? No - Patient declined No - Patient declined - No - Patient declined No - Patient declined No - Patient declined -  Pre-existing out of facility DNR order (yellow form or pink MOST form) - - - - - - -    Current Medications (verified) Outpatient Encounter Medications as of 03/30/2021  Medication Sig   Ascorbic Acid (VITAMIN C WITH ROSE HIPS) 1000 MG tablet Take 1,000 mg by mouth every other day.   Carboxymethylcellul-Glycerin (LUBRICATING EYE DROPS OP) Place 1 drop into both eyes daily as needed (dry eyes).   cycloSPORINE (RESTASIS) 0.05 % ophthalmic emulsion Place 1 drop into both eyes 2 (two) times daily.   DULoxetine (CYMBALTA) 60 MG capsule Take 2 capsules (120 mg total) by mouth daily.   famotidine (PEPCID) 20 MG tablet Take 20 mg by mouth at bedtime.   ibuprofen (ADVIL) 800 MG tablet TAKE 1 TABLET BY MOUTH EVERY 8 HOURS AS NEEDED FOR MODERATE PAIN.   loratadine (CLARITIN) 10 MG tablet TAKE 1 TABLET BY MOUTH EVERY DAY   methocarbamol (ROBAXIN) 500 MG tablet Take 1 tablet (500 mg total) by mouth every 6 (six) hours  as needed for muscle spasms.   Multiple Minerals-Vitamins (CITRACAL MAXIMUM PLUS) TABS Take 2 tablets by mouth every other day.   nystatin-triamcinolone (MYCOLOG II) cream APPLY TO AFFECTED AREA TWICE A DAY (Patient taking differently: Apply 1 application topically 2 (two) times daily as needed (rash).)   ondansetron (ZOFRAN) 4 MG tablet Take 1 tablet (4 mg total) by mouth every 8 (eight) hours as needed for nausea, vomiting or refractory nausea / vomiting.   oxybutynin (DITROPAN-XL) 10 MG 24 hr tablet TAKE 1 TABLET BY MOUTH EVERYDAY AT BEDTIME   oxyCODONE-acetaminophen (PERCOCET) 5-325 MG tablet Take 1 tablet by mouth every 4 (four) hours as needed for severe pain.   pantoprazole (PROTONIX) 40 MG tablet Take 1 tablet (40 mg total) by mouth daily. 30 minutes before breakfast   traZODone (DESYREL) 150 MG tablet Take 1-2 tablets (150-300 mg total) by mouth at bedtime.   fluticasone (FLONASE) 50 MCG/ACT nasal spray Place 2 sprays into both nostrils daily. (Patient taking differently: Place 2 sprays into both nostrils daily as needed for allergies.)   No facility-administered encounter medications on file as of 03/30/2021.    Allergies (verified) Elemental sulfur, Penicillins, and Sulfa antibiotics   History: Past Medical History:  Diagnosis Date   Acute frontal sinusitis 03/23/2020   Arthritis    Chronic constipation    Depression  GERD (gastroesophageal reflux disease)    H/O hiatal hernia    had surgery   History of blood transfusion    PONV (postoperative nausea and vomiting)    Urinary incontinence 07/21/2012   Past Surgical History:  Procedure Laterality Date   ABDOMINAL HYSTERECTOMY     APPENDECTOMY  1982   BIOPSY  03/30/2019   Procedure: BIOPSY;  Surgeon: Danie Binder, MD;  Location: [facility];  Service: Endoscopy;;  gastric    CATARACT EXTRACTION, BILATERAL     CESAREAN SECTION     CHOLECYSTECTOMY     ESOPHAGOGASTRODUODENOSCOPY (EGD) WITH PROPOFOL N/A 03/30/2019    Schatzki ring s/p dilation, moderate erosive NSAID gastritis s/p biopsy    EYE SURGERY N/A    Phreesia 11/06/2019   HERNIA REPAIR N/A    Phreesia 03/22/2020   HIATAL HERNIA REPAIR     JOINT REPLACEMENT N/A    [employer]reesia 11/06/2019   REVERSE SHOULDER ARTHROPLASTY Left 01/26/2021   Procedure: REVERSE SHOULDER ARTHROPLASTY;  Surgeon: Netta Cedars, MD;  Location: [facility];  Service: Orthopedics;  Laterality: Left;  with ISB   ROTATOR CUFF REPAIR     SAVORY DILATION N/A 03/30/2019   Procedure: SAVORY DILATION;  Surgeon: Danie Binder, MD;  Location: [facility];  Service: Endoscopy;  Laterality: N/A;   TOTAL KNEE ARTHROPLASTY  03/17/2012   Procedure: TOTAL KNEE ARTHROPLASTY;  Surgeon: Garald Balding, MD;  Location: [place];  Service: Orthopedics;  Laterality: Right;  RIGHT TOTAL ARTHROPLASTY   TOTAL KNEE ARTHROPLASTY Left 08/09/2014   Procedure: TOTAL KNEE ARTHROPLASTY;  Surgeon: Garald Balding, MD;  Location: [place];  Service: Orthopedics;  Laterality: Left;   Family History  Problem Relation Age of Onset   Prostate cancer Father    Cancer Sister        lung   CAD Other    Cancer Other    Diabetes Other    Breast cancer Neg Hx    Colon cancer Neg Hx    Colon polyps Neg Hx    Social History   Socioeconomic History   Marital status: Married    Spouse name: Mikki Santee   Number of children: 3   Years of education: Not on file   Highest education level: Not on file  Occupational History    Comment: [employer], [place] [employer] Use   Smoking status: Never   Smokeless tobacco: Never  Vaping Use   Vaping Use: Never used  Substance and Sexual Activity   Alcohol use: Yes    Comment: occ   Drug use: No   Sexual activity: Yes    Birth control/protection: Surgical    Comment: hyst  Other Topics Concern   Not on file  Social History Narrative   Married x 43 years.    24 Daugther, deceased at age 65   10 grandchildren and 2 step   Social Determinants of  Radio broadcast assistant Strain: Low Risk    Difficulty of Paying Living Expenses: Not hard at all  Food Insecurity: No Food Insecurity   Worried About Charity fundraiser in the Last Year: Never true   Arboriculturist in the Last Year: Never true  Transportation Needs: No Transportation Needs   Lack of Transportation (Medical): No   Lack of Transportation (Non-Medical): No  Physical Activity: Insufficiently Active   Days of Exercise per Week: 2 days   Minutes of Exercise per Session: 20 min  Stress: No Stress Concern Present   Feeling of Stress : Only a little  Social Connections: Engineer, building services of Communication with Friends and Family: More than three times a week   Frequency of Social Gatherings with Friends and Family: More than three times a week   Attends Religious Services: 1 to 4 times per year   Active Member of [employer] or Organizations: Yes   Attends Archivist Meetings: 1 to 4 times per year   Marital Status: Married    Tobacco Counseling Counseling given: Not Answered   Clinical Intake:  Pre-visit preparation completed: Yes  Pain : No/denies pain     BMI - recorded: 30.95 Nutritional Status: BMI > 30  Obese Nutritional Risks: None Diabetes: No  How often do you need to have someone help you when you read instructions, pamphlets, or other written materials from your doctor or pharmacy?: 1 - Never  Diabetic?NO  Interpreter Needed?: No  Information entered by :: [facility], LPN   Activities of Daily Living In your present state of health, do you have any difficulty performing the following activities: 03/30/2021 01/26/2021  Hearing? N N  Vision? N N  Difficulty concentrating or making decisions? N N  Comment concentrating at times. -  Walking or climbing stairs? N N  Dressing or bathing? N N  Doing errands, shopping? N N  Preparing Food and eating ? N -  Using the Toilet? N -  In the past six months, have you accidently  leaked urine? Y -  Comment Currently on Ditropan -  Do you have problems with loss of bowel control? N -  Managing your Medications? N -  Managing your Finances? N -  Housekeeping or managing your Housekeeping? N -  Some recent data might be hidden    Patient Care Team: Eulogio Bear, NP as PCP - General (Nurse Practitioner) Daneil Dolin, MD as Consulting Physician (Gastroenterology)  Indicate any recent Medical Services you may have received from other than [facility] providers in the past year (date may be approximate).     Assessment:   This is a routine wellness examination for [place].  Hearing/Vision screen Hearing Screening - Comments:: Hearing loss but does not wear hearing aids.  Vision Screening - Comments:: Read[facility]. [facility] in [place]. 2022.  Dietary issues and exercise activities discussed: Current Exercise Habits: Home exercise routine, Type of exercise: walking, Time (Minutes): 20, Frequency (Times/Week): 3, Weekly Exercise (Minutes/Week): 60, Intensity: Mild, Exercise limited by: cardiac condition(s);orthopedic condition(s)   Goals Addressed             This Visit's Progress    DIET - REDUCE CALORIE INTAKE       Lose weight and become more active.       Depression Screen PHQ 2/9 Scores 03/30/2021  PHQ - 2 Score 1    Fall Risk Fall Risk  03/30/2021 11/08/2020 03/23/2020 02/18/2019  Falls in the past year? 0 0 0 0  Number falls in past yr: 0 0 0 -  Injury with Fall? 0 0 0 -  Risk for fall due to : No Fall Risks - - -  Follow up Falls prevention discussed - - -    FALL RISK PREVENTION PERTAINING TO THE HOME:  Any stairs in or around the home? Yes  If so, are there any without handrails? No  Home free of loose throw rugs in walkways, pet beds, electrical cords, etc? Yes  Adequate lighting in  your home to reduce risk of falls? Yes   ASSISTIVE DEVICES UTILIZED TO PREVENT FALLS:  Life alert? No  Use of a cane, walker or w/c? No  Grab bars  in the bathroom? Yes  Shower chair or bench in shower? Yes  Elevated toilet seat or a handicapped toilet? Yes   TIMED UP AND GO:  Was the test performed? Yes .  Length of time to ambulate 10 feet: 10 sec.   Gait steady and fast without use of assistive device  Cognitive Function:     6CIT Screen 03/23/2020  What Year? 0 points  What month? 0 points  What time? 0 points  Count back from 20 0 points  Months in reverse 0 points  Repeat phrase 0 points  Total Score 0    Immunizations Immunization History  Administered Date(s) Administered   DTaP 10/31/1958   IPV 10/31/1958   MMR 10/31/1958   Meningococcal Conjugate 11/30/1980   PFIZER(Purple Top)SARS-COV-2 Vaccination 05/28/2019, 06/23/2019, 03/20/2020   Pneumococcal-Unspecified 01/30/1986   Tdap 04/01/1998, 09/11/2015    TDAP status: Up to date  Flu Vaccine status: Declined, Education has been provided regarding the importance of this vaccine but patient still declined. Advised may receive this vaccine at local pharmacy or Health Dept. Aware to provide a copy of the vaccination record if obtained from local pharmacy or Health Dept. Verbalized acceptance and understanding.  Pneumococcal vaccine status: Due, Education has been provided regarding the importance of this vaccine. Advised may receive this vaccine at local pharmacy or Health Dept. Aware to provide a copy of the vaccination record if obtained from local pharmacy or Health Dept. Verbalized acceptance and understanding.  Covid-19 vaccine status: Completed vaccines  Qualifies for Shingles Vaccine? Yes   Zostavax completed No   Shingrix Completed?: No.    Education has been provided regarding the importance of this vaccine. Patient has been advised to call insurance company to determine out of pocket expense if they have not yet received this vaccine. Advised may also receive vaccine at local pharmacy or Health Dept. Verbalized acceptance and  understanding.  Screening Tests Health Maintenance  Topic Date Due   COLONOSCOPY (Pts 45-63yr Insurance coverage will need to be confirmed)  Never done   Zoster Vaccines- Shingrix (1 of 2) Never done   Pneumonia Vaccine 67 Years old (1 - PCV) 12/28/2018   DEXA SCAN  Never done   COVID-19 Vaccine (4 - Booster for Pfizer series) 05/15/2020   INFLUENZA VACCINE  Never done   MAMMOGRAM  01/27/2022   TETANUS/TDAP  09/10/2025   Hepatitis C Screening  Completed   HPV VACCINES  Aged Out    Health Maintenance  Health Maintenance Due  Topic Date Due   COLONOSCOPY (Pts 45-462yrInsurance coverage will need to be confirmed)  Never done   Zoster Vaccines- Shingrix (1 of 2) Never done   Pneumonia Vaccine 6576Years old (1 - PCV) 12/28/2018   DEXA SCAN  Never done   COVID-19 Vaccine (4 - Booster for PfPleasant Vieweries) 05/15/2020   INFLUENZA VACCINE  Never done    Colorectal cancer screening: Type of screening: Colonoscopy. Completed 2015. Repeat every 10 years  Mammogram status: Completed 02/12/2020. Repeat every year Scheduled for 04/10/2020.  Bone Density status: Ordered 03/30/2021. Pt provided with contact info and advised to call to schedule appt.  Lung Cancer Screening: (Low Dose CT Chest recommended if Age 67-80ears, 30 pack-year currently smoking OR have quit w/in 15years.) does not qualify.    Additional  Screening:  Hepatitis C Screening: does qualify; Completed 11/09/2019  Vision Screening: Recommended annual ophthalmology exams for early detection of glaucoma and other disorders of the eye. Is the patient up to date with their annual eye exam?  Yes  Who is the provider or what is the name of the office in which the patient attends annual eye exams? [facility]- If pt is not established with a provider, would they like to be referred to a provider to establish care? No .   Dental Screening: Recommended annual dental exams for proper oral hygiene  Community Resource  Referral / Chronic Care Management: CRR required this visit?  No   CCM required this visit?  No      Plan:     I have personally reviewed and noted the following in the patients chart:   Medical and social history Use of alcohol, tobacco or illicit drugs  Current medications and supplements including opioid prescriptions.  Functional ability and status Nutritional status Physical activity Advanced directives List of other physicians Hospitalizations, surgeries, and ER visits in previous 12 months Vitals Screenings to include cognitive, depression, and falls Referrals and appointments  In addition, I have reviewed and discussed with patient certain preventive protocols, quality metrics, and best practice recommendations. A written personalized care plan for preventive services as well as general preventive health recommendations were provided to patient.     Chriss Driver, LPN   [id number]   Nurse Notes: Pt has mammogram ordered for 04/11/2019. Pt would like to schedule bone density. Order placed. Discussed shingle and prenvar vaccines and how to obtain. Colonoscopy done at age 37 by Dr. Imagene Sheller in [place].

## 2021-03-30 NOTE — Patient Instructions (Addendum)
[name] , Thank you for taking time to come for your Medicare Wellness Visit. I appreciate your ongoing commitment to your health goals. Please review the following plan we discussed and let me know if I can assist you in the future.   Screening recommendations/referrals: Colonoscopy: Done 2015 Dr. Molli Hazard in [place]. Repeat in 10 years  Mammogram: Done 02/12/2020. Scheduled for 04/10/2021. Bone Density: Order placed today, 03/30/21.  Recommended yearly ophthalmology/optometry visit for glaucoma screening and checkup Recommended yearly dental visit for hygiene and checkup  Vaccinations: Influenza vaccine: Declined.  Pneumococcal vaccine: Second dose due.  Tdap vaccine: Done 09/11/2015 Repeat in 10 years  Shingles vaccine: Shingrix discussed. Please contact your pharmacy for coverage information.     Covid-19:Done 05/28/2019, 06/23/2019 and 03/20/2020  Advanced directives: Advance directive discussed with you today. I have provided a copy for you to complete at home and have notarized. Once this is complete please bring a copy in to our office so we can scan it into your chart.   Conditions/risks identified: Aim for 30 minutes of exercise or brisk walking each day, drink 6-8 glasses of water and eat lots of fruits and vegetables.   Next appointment: Follow up in one year for your annual wellness visit 04/05/2022 at 11:15 am.    Preventive Care 67 Years and Older, Female Preventive care refers to lifestyle choices and visits with your health care provider that can promote health and wellness. What does preventive care include? A yearly physical exam. This is also called an annual well check. Dental exams once or twice a year. Routine eye exams. Ask your health care provider how often you should have your eyes checked. Personal lifestyle choices, including: Daily care of your teeth and gums. Regular physical activity. Eating a healthy diet. Avoiding tobacco and drug  use. Limiting alcohol use. Practicing safe sex. Taking low-dose aspirin every day. Taking vitamin and mineral supplements as recommended by your health care provider. What happens during an annual well check? The services and screenings done by your health care provider during your annual well check will depend on your age, overall health, lifestyle risk factors, and family history of disease. Counseling  Your health care provider may ask you questions about your: Alcohol use. Tobacco use. Drug use. Emotional well-being. Home and relationship well-being. Sexual activity. Eating habits. History of falls. Memory and ability to understand (cognition). Work and work Astronomer. Reproductive health. Screening  You may have the following tests or measurements: Height, weight, and BMI. Blood pressure. Lipid and cholesterol levels. These may be checked every 5 years, or more frequently if you are over 50 years old. Skin check. Lung cancer screening. You may have this screening every year starting at age 56 if you have a 30-pack-year history of smoking and currently smoke or have quit within the past 15 years. Fecal occult blood test (FOBT) of the stool. You may have this test every year starting at age 34. Flexible sigmoidoscopy or colonoscopy. You may have a sigmoidoscopy every 5 years or a colonoscopy every 10 years starting at age 77. Hepatitis C blood test. Hepatitis B blood test. Sexually transmitted disease (STD) testing. Diabetes screening. This is done by checking your blood sugar (glucose) after you have not eaten for a while (fasting). You may have this done every 1-3 years. Bone density scan. This is done to screen for osteoporosis. You may have this done starting at age 82. Mammogram. This may be done every 1-2 years. Talk to  your health care provider about how often you should have regular mammograms. Talk with your health care provider about your test results, treatment  options, and if necessary, the need for more tests. Vaccines  Your health care provider may recommend certain vaccines, such as: Influenza vaccine. This is recommended every year. Tetanus, diphtheria, and acellular pertussis (Tdap, Td) vaccine. You may need a Td booster every 10 years. Zoster vaccine. You may need this after age 92. Pneumococcal 13-valent conjugate (PCV13) vaccine. One dose is recommended after age 87. Pneumococcal polysaccharide (PPSV23) vaccine. One dose is recommended after age 24. Talk to your health care provider about which screenings and vaccines you need and how often you need them. This information is not intended to replace advice given to you by your health care provider. Make sure you discuss any questions you have with your health care provider. Document Released: 04/14/2015 Document Revised: 12/06/2015 Document Reviewed: 01/17/2015 Elsevier Interactive Patient Education  2017 [place] Prevention in the Home Falls can cause injuries. They can happen to people of all ages. There are many things you can do to make your home safe and to help prevent falls. What can I do on the outside of my home? Regularly fix the edges of walkways and driveways and fix any cracks. Remove anything that might make you trip as you walk through a door, such as a raised step or threshold. Trim any bushes or trees on the path to your home. Use bright outdoor lighting. Clear any walking paths of anything that might make someone trip, such as rocks or tools. Regularly check to see if handrails are loose or broken. Make sure that both sides of any steps have handrails. Any raised decks and porches should have guardrails on the edges. Have any leaves, snow, or ice cleared regularly. Use sand or salt on walking paths during winter. Clean up any spills in your garage right away. This includes oil or grease spills. What can I do in the bathroom? Use night lights. Install grab  bars by the toilet and in the tub and shower. Do not use towel bars as grab bars. Use non-skid mats or decals in the tub or shower. If you need to sit down in the shower, use a plastic, non-slip stool. Keep the floor dry. Clean up any water that spills on the floor as soon as it happens. Remove soap buildup in the tub or shower regularly. Attach bath mats securely with double-sided non-slip rug tape. Do not have throw rugs and other things on the floor that can make you trip. What can I do in the bedroom? Use night lights. Make sure that you have a light by your bed that is easy to reach. Do not use any sheets or blankets that are too big for your bed. They should not hang down onto the floor. Have a firm chair that has side arms. You can use this for support while you get dressed. Do not have throw rugs and other things on the floor that can make you trip. What can I do in the kitchen? Clean up any spills right away. Avoid walking on wet floors. Keep items that you use a lot in easy-to-reach places. If you need to reach something above you, use a strong step stool that has a grab bar. Keep electrical cords out of the way. Do not use floor polish or wax that makes floors slippery. If you must use wax, use non-skid floor wax. Do not  have throw rugs and other things on the floor that can make you trip. What can I do with my stairs? Do not leave any items on the stairs. Make sure that there are handrails on both sides of the stairs and use them. Fix handrails that are broken or loose. Make sure that handrails are as long as the stairways. Check any carpeting to make sure that it is firmly attached to the stairs. Fix any carpet that is loose or worn. Avoid having throw rugs at the top or bottom of the stairs. If you do have throw rugs, attach them to the floor with carpet tape. Make sure that you have a light switch at the top of the stairs and the bottom of the stairs. If you do not have them,  ask someone to add them for you. What else can I do to help prevent falls? Wear shoes that: Do not have high heels. Have rubber bottoms. Are comfortable and fit you well. Are closed at the toe. Do not wear sandals. If you use a stepladder: Make sure that it is fully opened. Do not climb a closed stepladder. Make sure that both sides of the stepladder are locked into place. Ask someone to hold it for you, if possible. Clearly mark and make sure that you can see: Any grab bars or handrails. First and last steps. Where the edge of each step is. Use tools that help you move around (mobility aids) if they are needed. These include: Canes. Walkers. Scooters. Crutches. Turn on the lights when you go into a dark area. Replace any light bulbs as soon as they burn out. Set up your furniture so you have a clear path. Avoid moving your furniture around. If any of your floors are uneven, fix them. If there are any pets around you, be aware of where they are. Review your medicines with your doctor. Some medicines can make you feel dizzy. This can increase your chance of falling. Ask your doctor what other things that you can do to help prevent falls. This information is not intended to replace advice given to you by your health care provider. Make sure you discuss any questions you have with your health care provider. Document Released: 01/12/2009 Document Revised: 08/24/2015 Document Reviewed: 04/22/2014 Elsevier Interactive Patient Education  2017 [employer].

## 2021-04-10 ENCOUNTER — Ambulatory Visit
Admission: RE | Admit: 2021-04-10 | Discharge: 2021-04-10 | Disposition: A | Discharge status: Home / Self Care | Payer: Medicare PPO | Source: Ambulatory Visit | Attending: Adult Health | Admitting: Adult Health

## 2021-04-10 DIAGNOSIS — Z1231 Encounter for screening mammogram for malignant neoplasm of breast: Secondary | ICD-10-CM

## 2021-08-09 ENCOUNTER — Other Ambulatory Visit: Payer: Self-pay | Admitting: Nurse Practitioner

## 2021-08-09 DIAGNOSIS — G4700 Insomnia, unspecified: Secondary | ICD-10-CM

## 2021-09-17 ENCOUNTER — Other Ambulatory Visit: Payer: Self-pay | Admitting: Nurse Practitioner

## 2021-09-17 DIAGNOSIS — G4700 Insomnia, unspecified: Secondary | ICD-10-CM

## 2021-10-08 ENCOUNTER — Other Ambulatory Visit: Payer: Medicare PPO

## 2021-10-20 ENCOUNTER — Other Ambulatory Visit: Payer: Self-pay | Admitting: Nurse Practitioner

## 2021-10-20 ENCOUNTER — Other Ambulatory Visit: Payer: Self-pay | Admitting: Adult Health

## 2021-10-20 DIAGNOSIS — F339 Major depressive disorder, recurrent, unspecified: Secondary | ICD-10-CM

## 2021-10-20 DIAGNOSIS — G4700 Insomnia, unspecified: Secondary | ICD-10-CM

## 2021-11-22 ENCOUNTER — Ambulatory Visit
Admission: RE | Admit: 2021-11-22 | Discharge: 2021-11-22 | Disposition: A | Discharge status: Home / Self Care | Payer: Medicare PPO | Source: Ambulatory Visit | Attending: Family Medicine | Admitting: Family Medicine

## 2021-11-22 DIAGNOSIS — Z780 Asymptomatic menopausal state: Secondary | ICD-10-CM

## 2021-12-11 ENCOUNTER — Telehealth: Payer: Self-pay

## 2021-12-11 NOTE — Telephone Encounter (Signed)
Transition Care Management Unsuccessful Follow-up Telephone Call  Date of discharge and from where:  12/10/21  Attempts:  1st Attempt  Reason for unsuccessful TCM follow-up call:  Left voice message    

## 2021-12-11 NOTE — Telephone Encounter (Signed)
Transition Care Management Follow-up Telephone Call Date of discharge and from where: 12/10/21 How have you been since you were released from the hospital? better Any questions or concerns? No, HOWEVER, PT IS NO LONGER UNDER PCP CARE PER PT  Items Reviewed: Did the pt receive and understand the discharge instructions provided? Yes  Medications obtained and verified? No  Other? No  Any new allergies since your discharge? No  Dietary orders reviewed? No Do you have support at home? No   Home Care and Equipment/Supplies: Were home health services ordered? not applicable If so, what is the name of the agency? N/A  Has the agency set up a time to come to the patient's home? not applicable Were any new equipment or medical supplies ordered?  No What is the name of the medical supply agency? N/a Were you able to get the supplies/equipment? no Do you have any questions related to the use of the equipment or supplies? No  Functional Questionnaire: (I = Independent and D = Dependent) ADLs: I  Bathing/Dressing- I  Meal Prep- I  Eating- I  Maintaining continence- I  Transferring/Ambulation- I  Managing Meds- I  Follow up appointments reviewed:  PCP Hospital f/u appt confirmed?  PT NO LONGER UNDER PCP CARE  Scheduled to Specialist Hospital f/u appt confirmed? No  Are transportation arrangements needed? No  If their condition worsens, is the pt aware to call PCP or go to the Emergency Dept.? No Was the patient provided with contact information for the PCP's office or ED? No Was to pt encouraged to call back with questions or concerns? No

## 2022-01-15 ENCOUNTER — Other Ambulatory Visit: Payer: Self-pay | Admitting: Nurse Practitioner

## 2022-01-15 DIAGNOSIS — K219 Gastro-esophageal reflux disease without esophagitis: Secondary | ICD-10-CM

## 2022-01-16 NOTE — Telephone Encounter (Signed)
Requested Prescriptions  Pending Prescriptions Disp Refills  . pantoprazole (PROTONIX) 40 MG tablet [Pharmacy Med Name: PANTOPRAZOLE SOD DR 40 MG TAB] 90 tablet 1    Sig: TAKE 1 TABLET (40 MG TOTAL) BY MOUTH DAILY. [place]     Gastroenterology: Proton Pump Inhibitors Failed - 01/15/2022  7:02 PM      Failed - Valid encounter within last 12 months    Recent Outpatient Visits          1 year ago Pre-op exam   [place] Eulogio Bear, NP   1 year ago [place] Eul[place] Bear, NP   1 year ago Encounter for annual wellness exam in Medicare patient   [place] Eulogio Bear, NP   1 year ago Acute maxillary sinusitis, recurrence not specified   [place], Mod[place] Nunnery, MD   2 years ago Encounter to establish care   [place], [place], FNP

## 2022-03-07 ENCOUNTER — Other Ambulatory Visit: Payer: Self-pay | Admitting: Adult Health

## 2022-03-13 ENCOUNTER — Other Ambulatory Visit: Payer: Self-pay | Admitting: Physician Assistant

## 2022-03-13 DIAGNOSIS — Z1231 Encounter for screening mammogram for malignant neoplasm of breast: Secondary | ICD-10-CM

## 2022-04-10 ENCOUNTER — Other Ambulatory Visit: Payer: Self-pay | Admitting: Family Medicine

## 2022-04-10 DIAGNOSIS — G4700 Insomnia, unspecified: Secondary | ICD-10-CM

## 2022-04-11 NOTE — Telephone Encounter (Signed)
Requested medication (s) are due for refill today: routing for review  Requested medication (s) are on the active medication list: yes  Last refill:  10/22/21  Future visit scheduled:no  Notes to clinic:  Unable to refill per protocol, appointment needed.      Requested Prescriptions  Pending Prescriptions Disp Refills   traZODone (DESYREL) 150 MG tablet [Pharmacy Med Name: TRAZODONE 150 MG TABLET] 180 tablet 1    Sig: TAKE 1-2 TABLETS (150-300 MG TOTAL) BY MOUTH AT BEDTIME.     Psychiatry: Antidepressants - Serotonin Modulator Failed - 04/10/2022  9:10 PM      Failed - Completed PHQ-2 or PHQ-9 in the last 360 days      Failed - Valid encounter within last 6 months    Recent Outpatient Visits           1 year ago Pre-op exam   [place] Eulogio Bear, NP   1 year ago [place] Eulogio Bear, NP   2 years ago Encounter for annual wellness exam in Medicare patient   [place] Eulogio Bear, NP   2 years ago Acute maxillary sinusitis, recurrence not specified   [place], Modena Nunnery, MD   2 years ago Encounter to establish care   [place], [place], FNP

## 2022-05-07 ENCOUNTER — Ambulatory Visit
Admission: RE | Admit: 2022-05-07 | Discharge: 2022-05-07 | Disposition: A | Discharge status: Home / Self Care | Payer: Medicare PPO | Source: Ambulatory Visit | Attending: Physician Assistant | Admitting: Physician Assistant

## 2022-05-07 DIAGNOSIS — Z1231 Encounter for screening mammogram for malignant neoplasm of breast: Secondary | ICD-10-CM

## 2022-05-18 ENCOUNTER — Other Ambulatory Visit: Payer: Self-pay | Admitting: Family Medicine

## 2022-05-18 DIAGNOSIS — G4700 Insomnia, unspecified: Secondary | ICD-10-CM

## 2022-05-20 NOTE — Telephone Encounter (Signed)
Requested medications are due for refill today.  Unsure  Requested medications are on the active medications list.  yes  Last refill. 10/22/2021 #180 1 rf  Future visit scheduled.   no  Notes to clinic.  Kathleen Lime listed as PCP.    Requested Prescriptions  Pending Prescriptions Disp Refills   traZODone (DESYREL) 150 MG tablet [Pharmacy Med Name: TRAZODONE 150 MG TABLET] 180 tablet 1    Sig: TAKE 1-2 TABLETS (150-300 MG TOTAL) BY MOUTH AT BEDTIME.     Psychiatry: Antidepressants - Serotonin Modulator Failed - 05/18/2022 11:23 AM      Failed - Completed PHQ-2 or PHQ-9 in the last 360 days      Failed - Valid encounter within last 6 months    Recent Outpatient Visits           1 year ago Pre-op exam   [place] Eulogio Bear, NP   1 year ago [place] Eulogio Bear, NP   2 years ago Encounter for annual wellness exam in Medicare patient   [place] Eulogio Bear, NP   2 years ago Acute maxillary sinusitis, recurrence not specified   [place], [place] Nunnery, MD   2 years ago Encounter to establish care   [place], [place], FNP

## 2022-12-02 ENCOUNTER — Other Ambulatory Visit: Payer: Self-pay | Admitting: Adult Health

## 2023-03-27 ENCOUNTER — Other Ambulatory Visit: Payer: Self-pay | Admitting: Family Medicine

## 2023-03-27 DIAGNOSIS — Z1231 Encounter for screening mammogram for malignant neoplasm of breast: Secondary | ICD-10-CM

## 2023-05-07 ENCOUNTER — Ambulatory Visit: Payer: Medicare PPO | Admitting: Neurology

## 2023-05-09 ENCOUNTER — Ambulatory Visit: Payer: Medicare PPO

## 2023-05-15 ENCOUNTER — Ambulatory Visit
Admission: RE | Admit: 2023-05-15 | Discharge: 2023-05-15 | Disposition: A | Discharge status: Home / Self Care | Payer: Medicare PPO | Source: Ambulatory Visit | Attending: Family Medicine | Admitting: Family Medicine

## 2023-05-15 DIAGNOSIS — Z1231 Encounter for screening mammogram for malignant neoplasm of breast: Secondary | ICD-10-CM

## 2023-11-17 ENCOUNTER — Other Ambulatory Visit: Payer: Self-pay | Admitting: Medical Genetics

## 2024-01-16 ENCOUNTER — Other Ambulatory Visit: Payer: Self-pay | Admitting: Medical Genetics

## 2024-01-16 DIAGNOSIS — Z006 Encounter for examination for normal comparison and control in clinical research program: Secondary | ICD-10-CM
